# Patient Record
Sex: Male | Born: 1980
Health system: Southern US, Community
[De-identification: ages and names within clinical notes are randomized; demographics above are authoritative.]

## PROBLEM LIST (undated history)

## (undated) DIAGNOSIS — I10 Essential (primary) hypertension: Secondary | ICD-10-CM

## (undated) DIAGNOSIS — Z8709 Personal history of other diseases of the respiratory system: Secondary | ICD-10-CM

## (undated) DIAGNOSIS — R45851 Suicidal ideations: Secondary | ICD-10-CM

## (undated) DIAGNOSIS — Z8701 Personal history of pneumonia (recurrent): Secondary | ICD-10-CM

## (undated) DIAGNOSIS — E782 Mixed hyperlipidemia: Secondary | ICD-10-CM

## (undated) DIAGNOSIS — F331 Major depressive disorder, recurrent, moderate: Secondary | ICD-10-CM

## (undated) DIAGNOSIS — E119 Type 2 diabetes mellitus without complications: Secondary | ICD-10-CM

## (undated) DIAGNOSIS — E669 Obesity, unspecified: Secondary | ICD-10-CM

## (undated) DIAGNOSIS — K760 Fatty (change of) liver, not elsewhere classified: Secondary | ICD-10-CM

## (undated) HISTORY — DX: Fatty (change of) liver, not elsewhere classified: K76.0

## (undated) HISTORY — DX: Suicidal ideations: R45.851

## (undated) HISTORY — DX: Mixed hyperlipidemia: E78.2

## (undated) HISTORY — DX: Personal history of pneumonia (recurrent): Z87.01

## (undated) HISTORY — DX: Essential (primary) hypertension: I10

## (undated) HISTORY — DX: Major depressive disorder, recurrent, moderate: F33.1

## (undated) HISTORY — DX: Personal history of other diseases of the respiratory system: Z87.09

## (undated) HISTORY — DX: Type 2 diabetes mellitus without complications: E11.9

## (undated) HISTORY — DX: Obesity, unspecified: E66.9

---

## 2003-12-18 ENCOUNTER — Encounter: Admission: RE | Admit: 2003-12-18 | Discharge: 2004-03-17 | Payer: Self-pay | Admitting: Pulmonary Disease

## 2006-05-03 ENCOUNTER — Ambulatory Visit: Payer: Self-pay | Admitting: Pulmonary Disease

## 2007-04-05 ENCOUNTER — Ambulatory Visit: Payer: Self-pay | Admitting: Pulmonary Disease

## 2007-04-05 LAB — CONVERTED CEMR LAB
ALT: 41 units/L (ref 0–53)
AST: 25 units/L (ref 0–37)
Alkaline Phosphatase: 60 units/L (ref 39–117)
BUN: 10 mg/dL (ref 6–23)
Basophils Relative: 0.5 % (ref 0.0–1.0)
Calcium: 9.4 mg/dL (ref 8.4–10.5)
Cholesterol: 203 mg/dL (ref 0–200)
Creatinine, Ser: 1.1 mg/dL (ref 0.4–1.5)
Direct LDL: 154.1 mg/dL
Eosinophils Relative: 3.2 % (ref 0.0–5.0)
Glucose, Bld: 116 mg/dL — ABNORMAL HIGH (ref 70–99)
HCT: 45.1 % (ref 39.0–52.0)
Hemoglobin: 15.7 g/dL (ref 13.0–17.0)
Lymphocytes Relative: 22.9 % (ref 12.0–46.0)
MCHC: 34.9 g/dL (ref 30.0–36.0)
Neutrophils Relative %: 63.7 % (ref 43.0–77.0)
Platelets: 214 10*3/uL (ref 150–400)
Potassium: 4.1 meq/L (ref 3.5–5.1)
RDW: 12.7 % (ref 11.5–14.6)
Total Bilirubin: 1.1 mg/dL (ref 0.3–1.2)
Total CHOL/HDL Ratio: 7.3
VLDL: 28 mg/dL (ref 0–40)
WBC: 5.2 10*3/uL (ref 4.5–10.5)

## 2008-05-02 ENCOUNTER — Ambulatory Visit: Payer: Self-pay | Admitting: Pulmonary Disease

## 2008-05-03 ENCOUNTER — Encounter: Payer: Self-pay | Admitting: Pulmonary Disease

## 2009-05-20 ENCOUNTER — Ambulatory Visit: Payer: Self-pay | Admitting: Pulmonary Disease

## 2009-05-24 LAB — CONVERTED CEMR LAB
ALT: 38 units/L (ref 0–53)
Alkaline Phosphatase: 60 units/L (ref 39–117)
BUN: 10 mg/dL (ref 6–23)
Basophils Absolute: 0 10*3/uL (ref 0.0–0.1)
Basophils Relative: 0.4 % (ref 0.0–3.0)
CO2: 26 meq/L (ref 19–32)
Direct LDL: 153.4 mg/dL
Eosinophils Relative: 4 % (ref 0.0–5.0)
Hemoglobin: 15.2 g/dL (ref 13.0–17.0)
Ketones, ur: NEGATIVE mg/dL
Lymphocytes Relative: 31.9 % (ref 12.0–46.0)
Lymphs Abs: 1.6 10*3/uL (ref 0.7–4.0)
Monocytes Relative: 9.1 % (ref 3.0–12.0)
Potassium: 4.1 meq/L (ref 3.5–5.1)
RDW: 12.5 % (ref 11.5–14.6)
Sodium: 142 meq/L (ref 135–145)
TSH: 1.76 microintl units/mL (ref 0.35–5.50)
Total Protein, Urine: 30 mg/dL
Total Protein: 7.4 g/dL (ref 6.0–8.3)
Urine Glucose: NEGATIVE mg/dL
VLDL: 36.6 mg/dL (ref 0.0–40.0)

## 2009-08-17 ENCOUNTER — Telehealth: Payer: Self-pay | Admitting: Pulmonary Disease

## 2009-08-21 ENCOUNTER — Ambulatory Visit: Payer: Self-pay | Admitting: Adult Health

## 2009-08-21 ENCOUNTER — Telehealth: Payer: Self-pay | Admitting: Adult Health

## 2009-09-28 ENCOUNTER — Telehealth (INDEPENDENT_AMBULATORY_CARE_PROVIDER_SITE_OTHER): Payer: Self-pay | Admitting: *Deleted

## 2010-05-19 ENCOUNTER — Ambulatory Visit: Payer: Self-pay | Admitting: Pulmonary Disease

## 2010-05-24 LAB — CONVERTED CEMR LAB
ALT: 44 units/L (ref 0–53)
AST: 27 units/L (ref 0–37)
Albumin: 4.4 g/dL (ref 3.5–5.2)
BUN: 10 mg/dL (ref 6–23)
Bilirubin Urine: NEGATIVE
Bilirubin, Direct: 0.1 mg/dL (ref 0.0–0.3)
Chloride: 103 meq/L (ref 96–112)
Eosinophils Relative: 2.3 % (ref 0.0–5.0)
GFR calc non Af Amer: 90.3 mL/min (ref >60)
HCT: 45 % (ref 39.0–52.0)
Hemoglobin: 15.7 g/dL (ref 13.0–17.0)
Leukocytes, UA: NEGATIVE
Lymphocytes Relative: 29.6 % (ref 12.0–46.0)
Lymphs Abs: 2.2 10*3/uL (ref 0.7–4.0)
MCV: 84.7 fL (ref 78.0–100.0)
Monocytes Absolute: 0.6 10*3/uL (ref 0.1–1.0)
Monocytes Relative: 8.6 % (ref 3.0–12.0)
Neutro Abs: 4.3 10*3/uL (ref 1.4–7.7)
Platelets: 217 10*3/uL (ref 150.0–400.0)
Potassium: 5 meq/L (ref 3.5–5.1)
RBC: 5.32 M/uL (ref 4.22–5.81)
RDW: 13.4 % (ref 11.5–14.6)
Sodium: 139 meq/L (ref 135–145)
TSH: 1.43 microintl units/mL (ref 0.35–5.50)
Total Bilirubin: 0.6 mg/dL (ref 0.3–1.2)
Total CHOL/HDL Ratio: 6

## 2010-08-02 ENCOUNTER — Telehealth: Payer: Self-pay | Admitting: Pulmonary Disease

## 2010-10-17 LAB — CONVERTED CEMR LAB
AST: 32 units/L (ref 0–37)
Albumin: 4.2 g/dL (ref 3.5–5.2)
Alkaline Phosphatase: 52 units/L (ref 39–117)
BUN: 9 mg/dL (ref 6–23)
Basophils Relative: 0.2 % (ref 0.0–3.0)
Bilirubin, Direct: 0.3 mg/dL (ref 0.0–0.3)
CO2: 28 meq/L (ref 19–32)
Crystals: NEGATIVE
Direct LDL: 163.2 mg/dL
Eosinophils Absolute: 0.1 10*3/uL (ref 0.0–0.7)
GFR calc Af Amer: 115 mL/min
GFR calc non Af Amer: 95 mL/min
HCT: 43.9 % (ref 39.0–52.0)
Hemoglobin, Urine: NEGATIVE
Ketones, ur: NEGATIVE mg/dL
Neutro Abs: 3.5 10*3/uL (ref 1.4–7.7)
Nitrite: NEGATIVE
RDW: 12.6 % (ref 11.5–14.6)
Sodium: 138 meq/L (ref 135–145)
Specific Gravity, Urine: 1.02 (ref 1.000–1.03)
Total Bilirubin: 1.5 mg/dL — ABNORMAL HIGH (ref 0.3–1.2)
Urobilinogen, UA: 1 (ref 0.0–1.0)
WBC: 5.5 10*3/uL (ref 4.5–10.5)
pH: 7 (ref 5.0–8.0)

## 2010-10-19 NOTE — Progress Notes (Signed)
Summary: FEVER/ COUGH  Phone Note Call from Patient   Caller: Patient Call For: Cassidy Tabet Summary of Call: PT REQUESTING Z PAK FOR FEVER COUGH AND CONGESTION [facility] [address] [place] Initial call taken by: Rickard Patience,  August 02, 2010 10:27 AM  Follow-up for Phone Call        Pt c/o head congestion, chest congestion, dry cough x 1 day. Pt requesting an rx for a ZPAK. Please advise.Carron Curie CMA  August 02, 2010 11:24 AM   Additional Follow-up for Phone Call Additional follow up Details #1::        per SN---ok for pt to have zpak #1  take as directed with no refills. this med has been sent to pharmacy and pt is aware Randell Loop CMA  August 02, 2010 2:13 PM     New/Updated Medications: ZITHROMAX Z-PAK 250 MG TABS (AZITHROMYCIN) take as directed Prescriptions: ZITHROMAX Z-PAK 250 MG TABS (AZITHROMYCIN) take as directed  #1 pak x 0   Entered by:   Randell Loop CMA   Authorized by:   Michele Mcalpine MD   Signed by:   Randell Loop CMA on 08/02/2010   Method used:   Electronically to        [facility] (retail)       [address]       [place], [place]  [address]       Ph: [phone] or [phone]       Fax: [phone]   RxID:   [id number]

## 2010-10-19 NOTE — Assessment & Plan Note (Signed)
Summary: rov 1 yr ///kp   CC:  Yearly ROV & CPX....  History of Present Illness: 30 y/o WM here for a follow up eval and CPX... he has a problem w/ Obesity & elevated lipids and blood sugar...   ~  May 20, 2009:  he's had a good year- still working for [employer]... hasn't been able to lose weight & we discussed diet + exercise strastegies... he tried Prav40 last yr but didn't stick w/ it beyond a month or so- preferring diet Rx...    ~  May 19, 2010:  his weight is up 18# this past yr to 315# now> we discussed the critically important need to diet/ exercise/ get wt down... BP remains OK but needs better diet to be able to avoid meds in the future... Hyperlipidemia persists and needs max efort if he wants to avoid meds here too... we had a frank discussion about these issues> he is moving in w/ his girlfriend & hopefully she will be able to assist in his diet efforts...    Current Problem List:  PHYSICAL EXAMINATION (ICD-V70.0) - good general health... feels well, no new complaints or concerns...  HYPERTENSION, BORDERLINE (ICD-401.9) - BP's here 140's - 160's / 80's - 100... surely related to his weight... discussed diet etc in the past... BP today = 140/80 and it needs to be better... not yet ready to start meds...  HYPERCHOLESTEROLEMIA (ICD-272.0) - on diet alone...  ~  FLP 7/08 showed TChol 203, TG 141, HDL 28, LDL 154... rec diet + exercise...  ~  FLP 8/09 showed TChol 219, TG 127, HDL 26, LDL 163... rec> start PRAV40, but he prefers diet alone.  ~  FLP 9/10 showed TChol 205, TG 183, HDL 27, LDL 153  ~  FLP 8/11 showed TChol 190, TG 228, HDL 33, LDL 114  DIABETES MELLITUS, BORDERLINE (ICD-790.29) - on diet alone...  ~  labs 7/08 showed BS= 116... discussed weight reduction via low carb diet...  ~  labs 8/09 showed BS= 103  ~  labs 9/10 showed BS= 120  ~  labs 8/11 showed BS= 110, A1c= 5.8  OBESITY (ICD-278.00) - difficult for him to diet/ exercise due to his work  schedule...  ~  weight 8/09 = 295#, up 3# in the last year...  ~  weight 9/10 = 298#  ~  weight 8/11 - 314#   Preventive Screening-Counseling & Management  Alcohol-Tobacco     Smoking Status: never  Allergies (verified): No Known Drug Allergies  Comments:  Nurse/Medical Assistant: The patient's medications and allergies were reviewed with the patient and were updated in the Medication and Allergy Lists.  Past History:  Past Medical History: HYPERTENSION, BORDERLINE (ICD-401.9) HYPERCHOLESTEROLEMIA (ICD-272.0) DIABETES MELLITUS, BORDERLINE (ICD-790.29) OBESITY (ICD-278.00)  Family History: Reviewed history from 05/20/2009 and no changes required. Father, Dorinda Hill, alive age 17 w/ hx CAD, Obesity, Chol, DM. Mother, Eunice Blase, alive age 86 w/ hx HBP, Obesity, Chol. No siblings  Social History: Reviewed history from 05/20/2009 and no changes required. Single no children never smoked exercises sometimes drinks caffeine no etoh  Review of Systems  The patient denies fever, chills, sweats, anorexia, fatigue, weakness, malaise, weight loss, sleep disorder, blurring, diplopia, eye irritation, eye discharge, vision loss, eye pain, photophobia, earache, ear discharge, tinnitus, decreased hearing, nasal congestion, nosebleeds, sore throat, hoarseness, chest pain, palpitations, syncope, dyspnea on exertion, orthopnea, PND, peripheral edema, cough, dyspnea at rest, excessive sputum, hemoptysis, wheezing, pleurisy, nausea, vomiting, diarrhea, constipation, change in bowel  habits, abdominal pain, melena, hematochezia, jaundice, gas/bloating, indigestion/heartburn, dysphagia, odynophagia, dysuria, hematuria, urinary frequency, urinary hesitancy, nocturia, incontinence, back pain, joint pain, joint swelling, muscle cramps, muscle weakness, stiffness, arthritis, sciatica, restless legs, leg pain at night, leg pain with exertion, rash, itching, dryness, suspicious lesions, paralysis, paresthesias,  seizures, tremors, vertigo, transient blindness, frequent falls, frequent headaches, difficulty walking, depression, anxiety, memory loss, confusion, cold intolerance, heat intolerance, polydipsia, polyphagia, polyuria, unusual weight change, abnormal bruising, bleeding, enlarged lymph nodes, urticaria, allergic rash, hay fever, and recurrent infections.    Vital Signs:  Patient profile:   30 year old male Height:      76 inches Weight:      314.50 pounds BMI:     38.42 O2 Sat:      98 % on Room air Temp:     97.3 degrees F oral Pulse rate:   118 / minute BP sitting:   140 / 80  (left arm) Cuff size:   large  Vitals Entered By: Randell Loop CMA (May 19, 2010 10:19 AM)  O2 Sat at Rest %:  98 O2 Flow:  Room air CC: Yearly ROV & CPX... Is Patient Diabetic? No Pain Assessment Patient in pain? no      Comments no daily meds    Physical Exam  Additional Exam:  WD, Obese, 30 y/o WM in NAD... GENERAL:  Alert & oriented; pleasant & cooperative... HEENT:  [place]/AT, EOM-wnl, PERRLA, Fundi-benign, EACs-clear, TMs-wnl, NOSE-clear, THROAT-clear & wnl. NECK:  Supple w/ full ROM; no JVD; normal carotid impulses w/o bruits; no thyromegaly or nodules palpated; no lymphadenopathy. CHEST:  Clear to P & A; without wheezes/ rales/ or rhonchi. HEART:  Regular Rhythm; without murmurs/ rubs/ or gallops. ABDOMEN:  Soft & nontender; normal bowel sounds; no organomegaly or masses detected. EXT: without deformities or arthritic changes; no varicose veins/ venous insuffic/ or edema. NEURO:  CN's intact; motor testing normal; sensory testing normal; gait normal & balance OK. DERM:  No lesions noted; no rash etc...    CXR  Procedure date:  05/19/2010  Findings:      CHEST - 2 VIEW Comparison: Chest x-ray 04/05/2007   Findings: The cardiac silhouette, mediastinal and hilar contours are within normal limits and stable.  The lungs are clear.  The bony thorax is intact.   IMPRESSION: No acute  cardiopulmonary findings.  No change since prior study.   Read By:  Cyndie Chime,  M.D.   EKG  Procedure date:  05/19/2010  Findings:      Normal sinus rhythm with rate of:  100/ min... Tracing is WNL, NAD... SN   MISC. Report  Procedure date:  05/19/2010  Findings:      BMP (METABOL)   Sodium                    139 mEq/L                   135-145   Potassium                 5.0 mEq/L                   3.5-5.1   Chloride                  103 mEq/L                   96-112   Carbon Dioxide  29 mEq/L                    19-32   Glucose              [H]  110 mg/dL                   11-91   BUN                       10 mg/dL                    4-78   Creatinine                1.0 mg/dL                   2.9-5.6   Calcium                   9.5 mg/dL                   2.1-30.8   GFR                       90.30 mL/min                >60  Hepatic/Liver Function Panel (HEPATIC)   Total Bilirubin           0.6 mg/dL                   6.5-7.8   Direct Bilirubin          0.1 mg/dL                   4.6-9.6   Alkaline Phosphatase      62 U/L                      39-117   AST                       27 U/L                      0-37   ALT                       44 U/L                      0-53   Total Protein             7.4 g/dL                    2.9-5.2   Albumin                   4.4 g/dL                    8.4-1.3  CBC Platelet w/Diff (CBCD)   White Cell Count          7.3 K/uL                    4.5-10.5   Red Cell Count            5.32 Mil/uL                 4.22-5.81   Hemoglobin  15.7 g/dL                   78.2-95.6   Hematocrit                45.0 %                      39.0-52.0   MCV                       84.7 fl                     78.0-100.0   Platelet Count            217.0 K/uL                  150.0-400.0   Neutrophil %              59.2 %                      43.0-77.0   Lymphocyte %              29.6 %                      12.0-46.0    Monocyte %                8.6 %                       3.0-12.0   Eosinophils%              2.3 %                       0.0-5.0   Basophils %               0.3 %                       0.0-3.0  Comments:      Lipid Panel (LIPID)   Cholesterol               190 mg/dL                   2-130   Triglycerides        [H]  228.0 mg/dL                 8.6-578.4   HDL                  [L]  69.62 mg/dL                 >95.28 Cholesterol LDL - Direct                             114.3 mg/dL   TSH (TSH)   FastTSH                   1.43 uIU/mL                 0.35-5.50   Hemoglobin A1C (A1C)   Hemoglobin A1C            5.8 %                       4.6-6.5  UDip w/Micro (URINE)   Color                     YELLOW   Clarity                   CLEAR                       Clear   Specific Gravity          >=1.030                     1.000 - 1.030   Urine Ph                  6.0                         5.0-8.0   Protein                   NEGATIVE                    Negative   Urine Glucose             NEGATIVE                    Negative   Ketones                   NEGATIVE                    Negative   Urine Bilirubin           NEGATIVE                    Negative   Blood                     NEGATIVE                    Negative   Urobilinogen              0.2                         0.0 - 1.0   Leukocyte Esterace        NEGATIVE                    Negative   Nitrite                   NEGATIVE                    Negative   Urine WBC                 0-2/hpf                     0-2/hpf   Urine Mucus               Presence of                 None   Impression & Recommendations:  Problem # 1:  PHYSICAL EXAMINATION (ICD-V70.0)  Orders: EKG w/ Interpretation (93000) EKG w/ Interpretation (93000) T-2 View CXR (71020TC) TLB-BMP (Basic Metabolic Panel-BMET) (80048-METABOL) TLB-Hepatic/Liver Function Pnl (80076-HEPATIC) TLB-CBC Platelet - w/Differential (85025-CBCD) TLB-Lipid Panel  (80061-LIPID) TLB-TSH (Thyroid  Stimulating Hormone) (84443-TSH) TLB-A1C / Hgb A1C (Glycohemoglobin) (83036-A1C) TLB-Udip w/ Micro (81001-URINE)  Problem # 2:  HYPERTENSION, BORDERLINE (ICD-401.9) We discussed need for meds down the road if he doesn't get his weight down...  Problem # 3:  HYPERCHOLESTEROLEMIA (ICD-272.0) Labs not at goal & he will likely require med Rx if he is not successful losing weight...  Problem # 4:  OBESITY (ICD-278.00) Everything is contingent upon his weight reduction & her understands this...  Patient Instructions: 1)  Let's get on track w/ our diet & exercise program... the goal is to create a negative balance & shoot for 1-2 lbs per week... 2)  Today we did your follow up CXR, EKG, & FASTINg blood work... please call the "phone tree" in a few days for your lab results.Marland KitchenMarland Kitchen 3)  Call for any problems,... 4)  Please schedule a follow-up appointment in 6 months.   [facility] ECG  ID: [id number] Patient: James [name], James [name] DOB: Oct 14, 1980 Age: 30 Years Old Sex: Male Race: White Physician: Jeniel Slauson Technician: Kandice Hams CMA Height: 76 Weight: 314.50 Status: Unconfirmed Past Medical History:    HYPERTENSION, BORDERLINE (ICD-401.9) - borderline  HYPERCHOLESTEROLEMIA (ICD-272.0) - on diet alone...  ~  FLP 7/08 showed TChol 203, TG 141, HDL 28, LDL 154... rec diet + exercise...  ~  FLP 8/09 showed TChol 219, TG 127, HDL 26, LDL 163... rec> start PRAV40, but he prefers diet alone.  ~  FLP 9/10 showed TChol 205, TG 183, HDL 27, LDL 153  DIABETES MELLITUS, BORDERLINE (ICD-790.29) - on diet alone...  ~  labs 7/08 showed BS= 116... discussed weight reduction via low carb diet...  ~  labs 8/09 showed BS= 103  ~  labd 9/10 showed BS= 120  OBESITY (ICD-278.00) - difficult for him to diet/ exercise due to his work schedule...  ~  weight 8/09 = 295#, up 3# in the last year...  ~  weight 9/10 = 298#   Recorded: 05/19/2010 10:43 AM P/PR: 113 ms / 148 ms -  Heart rate (maximum exercise) QRS: 110 QT/QTc/QTd: 354 ms / 424 ms / 31 ms - Heart rate (maximum exercise)  P/QRS/T axis: 47 deg / -24 deg / 48 deg - Heart rate (maximum exercise)  Heartrate: 100 bpm  Interpretation:  Normal sinus rhythm with rate of:  100/ min... Tracing is WNL, NAD... SN     Immunization History:  Influenza Immunization History:    Influenza:  historical (08/05/2009)

## 2010-10-19 NOTE — Progress Notes (Signed)
Summary: rx request/ congestion  Phone Note Call from Patient   Caller: Patient Call For: nadel Summary of Call: pt c/o sinus congestion/ pressure in face/ ears. requests z pac. pt # [id number] Initial call taken by: Tivis Ringer,  September 28, 2009 1:25 PM  Follow-up for Phone Call        per sn ok for z-pak ov if no better after completing this Follow-up by: Philipp Deputy CMA,  September 28, 2009 1:27 PM    Prescriptions: ZITHROMAX Z-PAK 250 MG TABS (AZITHROMYCIN) take as directed  #1 x 0   Entered by:   Philipp Deputy CMA   Authorized by:   Michele Mcalpine MD   Signed by:   Philipp Deputy CMA on 09/28/2009   Method used:   [facility]        [facility] (retail)       [address]       [place], [place]  [address]       Ph: [phone] or [phone]       Fax: [phone]   RxID:   [id number]

## 2010-11-19 ENCOUNTER — Encounter: Payer: Self-pay | Admitting: Pulmonary Disease

## 2010-11-19 ENCOUNTER — Other Ambulatory Visit: Payer: Self-pay | Admitting: Pulmonary Disease

## 2010-11-19 ENCOUNTER — Other Ambulatory Visit: Payer: 59

## 2010-11-19 ENCOUNTER — Ambulatory Visit (INDEPENDENT_AMBULATORY_CARE_PROVIDER_SITE_OTHER): Payer: Self-pay | Admitting: Pulmonary Disease

## 2010-11-19 DIAGNOSIS — R7309 Other abnormal glucose: Secondary | ICD-10-CM

## 2010-11-19 DIAGNOSIS — E669 Obesity, unspecified: Secondary | ICD-10-CM

## 2010-11-19 DIAGNOSIS — E7800 Pure hypercholesterolemia, unspecified: Secondary | ICD-10-CM

## 2010-11-19 DIAGNOSIS — E785 Hyperlipidemia, unspecified: Secondary | ICD-10-CM

## 2010-11-19 DIAGNOSIS — I10 Essential (primary) hypertension: Secondary | ICD-10-CM

## 2010-11-19 LAB — BASIC METABOLIC PANEL
BUN: 10 mg/dL (ref 6–23)
Calcium: 9.5 mg/dL (ref 8.4–10.5)
Creatinine, Ser: 1 mg/dL (ref 0.4–1.5)
Glucose, Bld: 120 mg/dL — ABNORMAL HIGH (ref 70–99)

## 2010-11-19 LAB — LDL CHOLESTEROL, DIRECT: Direct LDL: 149.7 mg/dL

## 2010-11-19 LAB — LIPID PANEL
Cholesterol: 213 mg/dL — ABNORMAL HIGH (ref 0–200)
Triglycerides: 192 mg/dL — ABNORMAL HIGH (ref 0.0–149.0)

## 2010-11-20 DIAGNOSIS — E782 Mixed hyperlipidemia: Secondary | ICD-10-CM | POA: Insufficient documentation

## 2010-11-20 DIAGNOSIS — E1169 Type 2 diabetes mellitus with other specified complication: Secondary | ICD-10-CM | POA: Insufficient documentation

## 2010-11-23 ENCOUNTER — Encounter: Payer: Self-pay | Admitting: Pulmonary Disease

## 2010-11-25 NOTE — Assessment & Plan Note (Signed)
Summary: 6 month follow up   CC:  6 month ROV & review of mult medical problems....  History of Present Illness: 30 y/o James [name] here for a follow up eval and CPX... he has a problem w/ Obesity & elevated lipids and blood sugar...   ~  May 20, 2009:  he's had a good year- still working for [employer]... hasn't been able to lose weight & we discussed diet + exercise strategies... he tried Prav40 last yr but didn't stick w/ it beyond a month or so- preferring diet Rx...    ~  May 19, 2010:  his weight is up James# this past yr to 315# now> we discussed the critically important need to diet/ exercise/ get wt down... BP remains OK but needs better diet to be able to avoid meds in the future... Hyperlipidemia persists and needs max efort if he wants to avoid meds here too... we had a frank discussion about these issues> he is moving in w/ his girlfriend & hopefully she will be able to assist in his diet efforts...   ~  November 19, 2010:  unfortunately weight up further to 324# now & FBS120, A1c6.2, & FLP worse as well (see below)... he will need to start Statin rx & get on diet/ exercise program to avoid DM meds down the road... in addition BP= 136/94 & we discussed step one antihypertensive rx needed at this point... he understands the reasoning behind the rx recommendations & the importance of weight reduction (he declines to consider bariatric options & promises to do it w/ diet + exercise...    Current Problem List:  PHYSICAL EXAMINATION (ICD-V70.0) - good general health... feels well, no new complaints or concerns...  HYPERTENSION, BORDERLINE (ICD-401.9) - BP's here 140's - 160's / 80's - 100... surely related to his weight... discussed diet etc in the past... BP today = 140/94 and we discussed the risk of waiting any longer before starting antihypertensive medications...  ~  3/12:  BP = 140/94 & pt rec to start METOPROLOL ER 50mg /d; monitor BP at home.  MIXED HYPERLIPIDEMIA (ICD-272.2)  ~  FLP 7/08 on diet showed TChol 203, TG 141, HDL 28, LDL 154... rec diet + exercise...  ~  FLP 8/09 on diet showed TChol 219, TG 127, HDL 26, LDL 163... rec> start PRAV40, but he prefers diet alone.  ~  FLP 9/10 on diet showed TChol 205, TG 183, HDL 27, LDL 153  ~  FLP 8/11 on diet showed TChol 190, TG 228, HDL 33, LDL 114  ~  FLP 3/12 on diet showed TChol 213, TG 192, HDL 34, LDL 150... rec to start PRAV40.  DIABETES MELLITUS, BORDERLINE (ICD-790.29) - on diet alone...  ~  labs 7/08 showed BS= 116... discussed weight reduction via low carb diet...  ~  labs 8/09 showed BS= 103  ~  labs 9/10 showed BS= 120... he needs to do better.  ~  labs 8/11 showed BS= 110, A1c= 5.8  ~  labs 3/12 showed BS= 120, A1c= 6.2.Marland Kitchen. he want to continue diet efforts.  OBESITY (ICD-278.00) - difficult for him to diet/ exercise due to his work schedule...  ~  weight 8/09 = 295#, up 3# in the last year...  ~  weight 9/10 = 298#  ~  weight 8/11 = 314#  ~  weight 3.12 = 324#   Allergies (verified): No Known Drug Allergies  Comments:  Nurse/Medical Assistant: The patient's medications and allergies were reviewed with  the patient and were updated in the Medication and Allergy Lists.  Past History:  Past Medical History: HYPERTENSION, BORDERLINE (ICD-401.9) MIXED HYPERLIPIDEMIA (ICD-272.2) DIABETES MELLITUS, BORDERLINE (ICD-790.29) OBESITY (ICD-278.00)  Family History: Reviewed history from 05/19/2010 and no changes required. Father, James James [name], alive age 72 w/ hx CAD, Obesity, Chol, DM. Mother, James James [name], alive age 70 w/ hx HBP, Obesity, Chol. No siblings  Social History: Reviewed history from 05/20/2009 and no changes required. Single no children never smoked exercises sometimes drinks caffeine no etoh  Review of Systems      See HPI  The patient denies anorexia, fever, weight loss, weight gain, vision loss, decreased hearing, hoarseness, chest pain, syncope, dyspnea on exertion, peripheral edema,  prolonged cough, headaches, hemoptysis, abdominal pain, melena, hematochezia, severe indigestion/heartburn, hematuria, incontinence, muscle weakness, suspicious skin lesions, transient blindness, difficulty walking, depression, unusual weight change, abnormal bleeding, enlarged lymph nodes, and angioedema.    Vital Signs:  Patient profile:   30 year old male Height:      76 inches Weight:      324.13 pounds BMI:     39.60 O2 Sat:      95 % on Room air Temp:     97.0 degrees F oral Pulse rate:   87 / minute BP sitting:   140 / 94  (left arm) Cuff size:   regular  Vitals Entered By: Randell Loop CMA (November 19, 2010 9:53 AM)  O2 Sat at Rest %:  95 O2 Flow:  Room air  Physical Exam  Additional Exam:  WD, Obese, 29 y/o James [name] in NAD... GENERAL:  Alert & oriented; pleasant & cooperative... HEENT:  [place]/AT, EOM-wnl, PERRLA, Fundi-benign, EACs-clear, TMs-wnl, NOSE-clear, THROAT-clear & wnl. NECK:  Supple w/ full ROM; no JVD; normal carotid impulses w/o bruits; no thyromegaly or nodules palpated; no lymphadenopathy. CHEST:  Clear to P & A; without wheezes/ rales/ or rhonchi. HEART:  Regular Rhythm; without murmurs/ rubs/ or gallops. ABDOMEN:  Soft & nontender; normal bowel sounds; no organomegaly or masses detected. EXT: without deformities or arthritic changes; no varicose veins/ venous insuffic/ or edema. NEURO:  CN's intact; motor testing normal; sensory testing normal; gait normal & balance OK. DERM:  No lesions noted; no rash etc...    Impression & Recommendations:  Problem # 1:  HYPERTENSION, BORDERLINE (ICD-401.9) BP at 140/94 coupled w/ his other risk factors Hyperlipid, DM, Obese> indicates need to be more aggressive w/ BP rx & he is rec to start METOPROLOL ER 50mg /d, & monitor BP at home... he understands & will comply w/ med Rx... His updated medication list for this problem includes:    Metoprolol Succinate 50 Mg Xr24h-tab (Metoprolol succinate) .Marland Kitchen... Take 1 tab by mouth once  daily...  Orders: TLB-BMP (Basic Metabolic Panel-BMET) (80048-METABOL) TLB-A1C / Hgb A1C (Glycohemoglobin) (83036-A1C) TLB-Lipid Panel (80061-LIPID)  Problem # 2:  MIXED HYPERLIPIDEMIA (ICD-272.2) With his wt gain the FLP is further deranged 7 he needs to restart med rx for this prob as well...  REC>  start PRAVASTATIN 40mg  (on the $4 list)...   Problem # 3:  DIABETES MELLITUS, BORDERLINE (ICD-790.29) We discussed how he will need to add DM meds into the mix very soon if he doesn't lose the weight... for now A1c is still OK at 6.2 therefore> concentrate on diet & wt reduction...  Problem # 4:  OBESITY (ICD-278.00) Everything depends upon wt reduction... discussed in detil w/ pt...  Complete Medication List: 1)  Metoprolol Succinate 50 Mg Xr24h-tab (Metoprolol succinate) .... Take 1  tab by mouth once daily...  Patient Instructions: 1)  Today we updated your med list- see below.... 2)  We decided to start METOPROLOL ER 50mg /d for your BP.Marland KitchenMarland Kitchen 3)  Today we did your follow up fasting labs... please call the "phone tree" in a few days for your lab results.Marland KitchenMarland Kitchen 4)  Let's get on track w/ our diet & exercise program!!! 5)  Call for any questions.Marland KitchenMarland Kitchen 6)  Please schedule a follow-up appointment in 6 months. Prescriptions: METOPROLOL SUCCINATE 50 MG XR24H-TAB (METOPROLOL SUCCINATE) take 1 tab by mouth once daily...  #90 x 4   Entered and Authorized by:   Michele Mcalpine MD   Signed by:   Michele Mcalpine MD on 11/19/2010   Method used:   Print then Give to Patient   RxID:   [id number]

## 2010-11-25 NOTE — Letter (Signed)
Summary: [employer]  [address]   [place], [place] [address]   Phone: [phone]  Fax: [phone]    Today's Date: November 19, 2010  Name of Patient: James [name]  The above named patient had a medical visit today at:  10:00 am.  Please take this into consideration when reviewing the time away from work.    Special Instructions:  [ x ] None  [  ] To be off the remainder of today, returning to the normal work / school schedule tomorrow.  [  ] To be off until the next scheduled appointment on ______________________.  [  ] Other ________________________________________________________________ ________________________________________________________________________   Sincerely yours,   Vernie Murders

## 2010-11-25 NOTE — Letter (Signed)
Summary: [employer]  [address]   [place], [place] [address]   Phone: [phone]  Fax: [phone]    Today's Date: November 19, 2010  Name of Patient: James [name]  The above named patient had a medical visit today at: 10 am .  Please take this into consideration when reviewing the time away from work.    Special Instructions:  [ x ] None  [  ] To be off the remainder of today, returning to the normal work / school schedule tomorrow.  [  ] To be off until the next scheduled appointment on ______________________.  [  ] Other ________________________________________________________________ ________________________________________________________________________   Sincerely yours,     Alroy Dust, MD

## 2010-11-30 NOTE — Miscellaneous (Signed)
Summary: lab results/start on pravastatin  Clinical Lists Changes  Medications: Added new medication of PRAVASTATIN SODIUM 40 MG TABS (PRAVASTATIN SODIUM) take one tablet by mouth at bedtime - Signed Rx of PRAVASTATIN SODIUM 40 MG TABS (PRAVASTATIN SODIUM) take one tablet by mouth at bedtime;  #90 x 3;  Signed;  Entered by: Randell Loop CMA;  Authorized by: Michele Mcalpine MD;  Method used: Electronically to [facility], [place], [place]  [address], Ph: [phone] or [phone], Fax: [phone]    Prescriptions: PRAVASTATIN SODIUM 40 MG TABS (PRAVASTATIN SODIUM) take one tablet by mouth at bedtime  #90 x 3   Entered by:   Randell Loop CMA   Authorized by:   Michele Mcalpine MD   Signed by:   Randell Loop CMA on 11/23/2010   Method used:   Electronically to        [facility] [id number] (retail)       [address]       [place], [place]  [address]       Ph: [phone] or [phone]       Fax: [phone]   RxID:   [id number]  called and spoke with pt about his lab results----per SN---fbs 120--a1c 6.2---does not need meds now but needs to get his weight down--lip-213  tg 192    hld 34 and ldl 150----recs are to start on pravastatin 40 at bedtime and this [facility] supply and pt is aware.   pt needs to get on diet and get his weight down and start low chol/low fat diet.  pt voiced his understanding of this and will call for any concerns Randell Loop CMA  November 23, 2010 2:19 PM

## 2011-05-27 ENCOUNTER — Ambulatory Visit: Payer: 59 | Admitting: Pulmonary Disease

## 2011-05-30 ENCOUNTER — Other Ambulatory Visit: Payer: Self-pay | Admitting: Pulmonary Disease

## 2011-05-30 ENCOUNTER — Encounter: Payer: Self-pay | Admitting: Pulmonary Disease

## 2011-05-30 ENCOUNTER — Other Ambulatory Visit (INDEPENDENT_AMBULATORY_CARE_PROVIDER_SITE_OTHER): Payer: 59

## 2011-05-30 ENCOUNTER — Ambulatory Visit (INDEPENDENT_AMBULATORY_CARE_PROVIDER_SITE_OTHER): Payer: 59 | Admitting: Pulmonary Disease

## 2011-05-30 DIAGNOSIS — I10 Essential (primary) hypertension: Secondary | ICD-10-CM

## 2011-05-30 DIAGNOSIS — R7309 Other abnormal glucose: Secondary | ICD-10-CM

## 2011-05-30 DIAGNOSIS — E669 Obesity, unspecified: Secondary | ICD-10-CM

## 2011-05-30 DIAGNOSIS — E782 Mixed hyperlipidemia: Secondary | ICD-10-CM

## 2011-05-30 LAB — CBC WITH DIFFERENTIAL/PLATELET
Basophils Relative: 0.5 % (ref 0.0–3.0)
Eosinophils Relative: 3.8 % (ref 0.0–5.0)
HCT: 46 % (ref 39.0–52.0)
Hemoglobin: 15.3 g/dL (ref 13.0–17.0)
Lymphs Abs: 2 10*3/uL (ref 0.7–4.0)
MCV: 85.6 fl (ref 78.0–100.0)
Monocytes Absolute: 0.6 10*3/uL (ref 0.1–1.0)
Monocytes Relative: 8.4 % (ref 3.0–12.0)
RBC: 5.38 Mil/uL (ref 4.22–5.81)
WBC: 6.6 10*3/uL (ref 4.5–10.5)

## 2011-05-30 LAB — LIPID PANEL
Triglycerides: 180 mg/dL — ABNORMAL HIGH (ref 0.0–149.0)
VLDL: 36 mg/dL (ref 0.0–40.0)

## 2011-05-30 LAB — TSH: TSH: 1.35 u[IU]/mL (ref 0.35–5.50)

## 2011-05-30 LAB — BASIC METABOLIC PANEL
BUN: 9 mg/dL (ref 6–23)
Chloride: 106 mEq/L (ref 96–112)
Glucose, Bld: 119 mg/dL — ABNORMAL HIGH (ref 70–99)
Potassium: 4.1 mEq/L (ref 3.5–5.1)

## 2011-05-30 LAB — HEPATIC FUNCTION PANEL
ALT: 82 U/L — ABNORMAL HIGH (ref 0–53)
AST: 56 U/L — ABNORMAL HIGH (ref 0–37)
Albumin: 4.3 g/dL (ref 3.5–5.2)

## 2011-05-30 NOTE — Patient Instructions (Signed)
Today we updated your meds in EPIC...    Continue your current meds the same...  Today we did your follow up fasting blood work...    Please call the PHONE TREE in a few days for your results...    Dial [id number] & when prompted enter your patient number followed by the # symbol...    Your patient number is:  [id number]#  Let's get on track w/ our diet & exercise program...    The goal is to lose 15-20 lbs...  Call for any questions...  Let's plan a follow up visit in 6 months to monitor your progress.Marland KitchenMarland Kitchen

## 2011-05-30 NOTE — Progress Notes (Signed)
Subjective:    Patient ID: James [name], male    DOB: April 06, 1981, 30 y.o.   MRN: [id number]  HPI 30 y/o WM here for a follow up eval and CPX... he has a problem w/ Obesity & elevated lipids and blood sugar...  ~  May 20, 2009:  he's had a good year- still working for [employer]... hasn't been able to lose weight & we discussed diet + exercise strategies... he tried Prav40 last yr but didn't stick w/ it beyond a month or so- preferring diet Rx...   ~  May 19, 2010:  his weight is up 18# this past yr to 315# now> we discussed the critically important need to diet/ exercise/ get wt down... BP remains OK but needs better diet to be able to avoid meds in the future... Hyperlipidemia persists and needs max efort if he wants to avoid meds here too... we had a frank discussion about these issues> he is moving in w/ his girlfriend & hopefully she will be able to assist in his diet efforts...  ~  November 19, 2010:  unfortunately weight up further to 324# now & FBS120, A1c6.2, & FLP worse as well (see below)... he will need to start Statin rx & get on diet/ exercise program to avoid DM meds down the road... in addition BP= 136/94 & we discussed step one antihypertensive rx needed at this point... he understands the reasoning behind the rx recommendations & the importance of weight reduction (he declines to consider bariatric options & promises to do it w/ diet + exercise...  ~  May 30, 2011:  38mo ROV & essentially no change> weight still 324# and FLP no better on PRAV40, plus his LFTs are sl elev now, therefore advised to STOP the Pravastatin & get on diet, exercise, wt reduction program...    HBP> on ToprolXL50 & BP= 126/84; he is asymptomatic- w/o HA, CP, palpit, SOB, swelling, etc...    Hyperlipid> on Prav40 for the last 38mo; FLP today is NO BETTER & LFTs sl elev, therefore stop med for washout & needs better diet, exerc, lose wt!!!    Borderline DM> on diet alone; prev A1c=6.2;  labs today w/ BS119, A1c6.1; continue diet efforts...    Obesity> weight is 324# no change from 38mo ago & this is his max weight; we reviewed diet, exercise, etc...          Problem List:  PHYSICAL EXAMINATION (ICD-V70.0) - good general health... feels well, no new complaints or concerns...  HYPERTENSION, BORDERLINE (ICD-401.9) - BP's here 140's-160's / 80's-100... surely related to his weight... discussed diet etc in the past... BP today = 126/84 on his METOPROLOL ER 50mg /d... ~  3/12:  BP= 140/94 & pt rec to start METOPROLOL ER 50mg /d; monitor BP at home. ~  9/12:  BP= 126/84, continue same med, better diet, get wt down...  MIXED HYPERLIPIDEMIA (ICD-272.2) ~  FLP 7/08 on diet showed TChol 203, TG 141, HDL 28, LDL 154... rec diet + exercise... ~  FLP 8/09 on diet showed TChol 219, TG 127, HDL 26, LDL 163... rec> start PRAV40, but he prefers diet alone. ~  FLP 9/10 on diet showed TChol 205, TG 183, HDL 27, LDL 153 ~  FLP 8/11 on diet showed TChol 190, TG 228, HDL 33, LDL 114 ~  FLP 3/12 on diet showed TChol 213, TG 192, HDL 34, LDL 150... rec to start PRAV40. ~  FLP 9/12 on Prav40 showed TChol 216, TG  180, HDL 34, LDL 157... No response & LFTs sl elev, therefore STOP Prav Rx  DIABETES MELLITUS, BORDERLINE (ICD-790.29) - on diet alone... ~  labs 7/08 showed BS= 116... discussed weight reduction via low carb diet... ~  labs 8/09 showed BS= 103 ~  labs 9/10 showed BS= 120... he needs to do better. ~  labs 8/11 showed BS= 110, A1c= 5.8 ~  labs 3/12 showed BS= 120, A1c= 6.2.Marland Kitchen. he want to continue diet efforts. ~  Labs 9/12 showed BS= 119, A1c= 6.1  OBESITY (ICD-278.00) - difficult for him to diet/ exercise due to his work schedule... ~  weight 8/09 = 295#, up 3# in the last year... ~  weight 9/10 = 298# ~  weight 8/11 = 314# ~  weight 3.12 = 324# ~  Weight 9/12 = 324#   No past surgical history on file.   Outpatient Encounter Prescriptions as of 05/30/2011  Medication Sig Dispense  Refill  . metoprolol (TOPROL-XL) 50 MG 24 hr tablet Take 50 mg by mouth daily.        . pravastatin (PRAVACHOL) 40 MG tablet Take 40 mg by mouth daily.          No Known Allergies   Current Medications, Allergies, Past Medical History, Past Surgical History, Family History, and Social History were reviewed in [employer] [facility] record.    Review of Systems        See HPI - all other systems neg except as noted...  The patient denies anorexia, fever, weight loss, weight gain, vision loss, decreased hearing, hoarseness, chest pain, syncope, dyspnea on exertion, peripheral edema, prolonged cough, headaches, hemoptysis, abdominal pain, melena, hematochezia, severe indigestion/heartburn, hematuria, incontinence, muscle weakness, suspicious skin lesions, transient blindness, difficulty walking, depression, unusual weight change, abnormal bleeding, enlarged lymph nodes, and angioedema.     Objective:   Physical Exam    WD, Obese, 30 y/o WM in NAD... GENERAL:  Alert & oriented; pleasant & cooperative... HEENT:  [place]/AT, EOM-wnl, PERRLA, Fundi-benign, EACs-clear, TMs-wnl, NOSE-clear, THROAT-clear & wnl. NECK:  Supple w/ full ROM; no JVD; normal carotid impulses w/o bruits; no thyromegaly or nodules palpated; no lymphadenopathy. CHEST:  Clear to P & A; without wheezes/ rales/ or rhonchi. HEART:  Regular Rhythm; without murmurs/ rubs/ or gallops. ABDOMEN:  Soft & nontender; normal bowel sounds; no organomegaly or masses detected. EXT: without deformities or arthritic changes; no varicose veins/ venous insuffic/ or edema. NEURO:  CN's intact; motor testing normal; sensory testing normal; gait normal & balance OK. DERM:  No lesions noted; no rash etc...   Assessment & Plan:   HBP>  Controlled on the ToprolXL but needs better diet 7 wt reduction...  HYPERLIPID>  FLP no better on the Prav40 (he says he was taking it regularly- we did not check w/ pharm); LFT sl elev as well;  therefore decided to STOP the med, concentrate on diet & weight reduction; if not successful over the next 31mo, then we will consider Cres10...  DM>  On diet alone & still adeq managed but needs wt reduction as noted...  OBESITY>  Weight is unchanged at 324#; we reviewed wt reduction strategies.Marland KitchenMarland Kitchen

## 2011-06-02 ENCOUNTER — Telehealth: Payer: Self-pay | Admitting: *Deleted

## 2011-06-03 NOTE — Telephone Encounter (Signed)
lmom to make pt aware of phone note and of lab results.   Please see result note.

## 2011-06-11 ENCOUNTER — Encounter: Payer: Self-pay | Admitting: Pulmonary Disease

## 2011-11-28 ENCOUNTER — Encounter: Payer: Self-pay | Admitting: Pulmonary Disease

## 2011-11-28 ENCOUNTER — Other Ambulatory Visit (INDEPENDENT_AMBULATORY_CARE_PROVIDER_SITE_OTHER): Payer: 59

## 2011-11-28 ENCOUNTER — Ambulatory Visit (INDEPENDENT_AMBULATORY_CARE_PROVIDER_SITE_OTHER): Payer: 59 | Admitting: Pulmonary Disease

## 2011-11-28 VITALS — BP 122/80 | HR 103 | Temp 97.5°F | Ht 76.0 in | Wt 324.8 lb

## 2011-11-28 DIAGNOSIS — I10 Essential (primary) hypertension: Secondary | ICD-10-CM

## 2011-11-28 DIAGNOSIS — E782 Mixed hyperlipidemia: Secondary | ICD-10-CM

## 2011-11-28 DIAGNOSIS — E669 Obesity, unspecified: Secondary | ICD-10-CM

## 2011-11-28 DIAGNOSIS — R7309 Other abnormal glucose: Secondary | ICD-10-CM

## 2011-11-28 LAB — BASIC METABOLIC PANEL
CO2: 27 mEq/L (ref 19–32)
Calcium: 9.1 mg/dL (ref 8.4–10.5)
Creatinine, Ser: 0.9 mg/dL (ref 0.4–1.5)
GFR: 101.83 mL/min (ref >60.00)
Glucose, Bld: 165 mg/dL — ABNORMAL HIGH (ref 70–99)
Sodium: 138 mEq/L (ref 135–145)

## 2011-11-28 LAB — LIPID PANEL
Cholesterol: 211 mg/dL — ABNORMAL HIGH (ref 0–200)
HDL: 36.1 mg/dL — ABNORMAL LOW (ref >39.00)
Total CHOL/HDL Ratio: 6
Triglycerides: 228 mg/dL — ABNORMAL HIGH (ref 0.0–149.0)

## 2011-11-28 LAB — LDL CHOLESTEROL, DIRECT: Direct LDL: 123.4 mg/dL

## 2011-11-28 MED ORDER — ATENOLOL 100 MG PO TABS: 100.0000 mg | ORAL_TABLET | Freq: Every day | ORAL | Status: DC

## 2011-11-28 NOTE — Progress Notes (Signed)
Subjective:    Patient ID: James [name], male    DOB: [birth year], 31 y.o.   MRN: [id number]  HPI 31 y/o WM here for a follow up eval and CPX... he has a problem w/ Obesity & elevated lipids and blood sugar...  ~  May 19, 2010:  his weight is up 18# this past yr to 315# now> we discussed the critically important need to diet/ exercise/ get wt down... BP remains OK but needs better diet to be able to avoid meds in the future... Hyperlipidemia persists and needs max effort if he wants to avoid meds here too... we had a frank discussion about these issues> he is moving in w/ his girlfriend & hopefully she will be able to assist in his diet efforts...  ~  November 19, 2010:  unfortunately weight up further to 324# now & FBS120, A1c6.2, & FLP worse as well (see below)... he will need to start Statin rx & get on diet/ exercise program to avoid DM meds down the road... in addition BP= 136/94 & we discussed step one antihypertensive rx needed at this point... he understands the reasoning behind the rx recommendations & the importance of weight reduction (he declines to consider bariatric options & promises to do it w/ diet + exercise...  ~  May 30, 2011:  85mo ROV & essentially no change> weight still 324# and FLP no better on PRAV40, plus his LFTs are sl elev now, therefore advised to STOP the Pravastatin & get on diet, exercise, wt reduction program...    HBP> on ToprolXL50 & BP= 126/84; he is asymptomatic- w/o HA, CP, palpit, SOB, swelling, etc...    Hyperlipid> on Prav40 for the last 85mo; FLP today is NO BETTER & LFTs sl elev, therefore stop med for washout & needs better diet, exerc, lose wt!!!    Borderline DM> on diet alone; prev A1c=6.2; labs today w/ BS119, A1c6.1; continue diet efforts...    Obesity> weight is 324# no change from 85mo ago & this is his max weight; we reviewed diet, exercise, etc...  ~  November 28, 2011:  85mo ROV & James [name] feels well but wt is essent the same & while labs  today showed some improvement in FLP on diet alone, his BS/ A1c are worse & he will need to start Metformin;  He also c/o some palpit on the Metoprolol he says describing a rapid regular beating that tapers off slowly to normal; we decided to change his BBlocker to ATENOLOL 100mg /d & follow his symptoms...    HBP, palpit> on ToprolXL50 & BP= 122/80 but he's noted some palpit- rapid regular & tapers off slowly to normal; we decided to change BBlocker to ATENOLOL 100mg /d...    Hyperlipid> off Prav40 for the last 85mo due to elev LFTs; he is on diet alone but hasn't lost any wt; FLP is actually better now w/ [id number] & [id number]; rec to continue diet & exercise!    Borderline DM> on diet alone; wt up 1# to 254#; labs are worse w/ FBS=165, A1c=7.0; we decided to START METFORMIN 500mg  Qam, plus much better diet & exercise!!!    Obesity> weight is 325# up 1# from 85mo ago & this is his max weight; we reviewed diet, exercise, etc; he understands that wt reduction is the key to getting off meds...          Problem List:  PHYSICAL EXAMINATION (ICD-V70.0) - good general health... feels well, no new complaints or concerns...  HYPERTENSION, BORDERLINE (  ICD-401.9) - BP's here 140's-160's / 80's-100> surely related to his weight> discussed diet & exercise needed. ~  3/12:  BP= 140/94 & pt rec to start METOPROLOL ER 50mg /d; monitor BP at home. ~  9/12:  BP= 126/84, continue same med, better diet, get wt down... ~  3/13:  BP= 122/80 but he is c/o palpit- rapid, regular, & tapers off to normal; he thinks related to Metoprolol Rx therefore switched to ATENOLOL 100mg /d, monitor BP & palpit.  MIXED HYPERLIPIDEMIA (ICD-272.2) >> ~  FLP 7/08 on diet showed TChol 203, TG 141, HDL 28, LDL 154... rec diet + exercise... ~  FLP 8/09 on diet showed TChol 219, TG 127, HDL 26, LDL 163... rec> start PRAV40, but he prefers diet alone. ~  FLP 9/10 on diet showed TChol 205, TG 183, HDL 27, LDL 153 ~  FLP 8/11 on diet showed TChol 190,  TG 228, HDL 33, LDL 114 ~  FLP 3/12 on diet showed TChol 213, TG 192, HDL 34, LDL 150... rec to start PRAV40. ~  FLP 9/12 on Prav40 showed TChol 216, TG 180, HDL 34, LDL 157... No response & LFTs sl elev, therefore STOP Prav Rx. ~  FLP 3/13 on diet showed TChol 211, TG 228, HDL 36, LDL 123... rec better low chol/ low fat diet & get wt down!  DIABETES MELLITUS, BORDERLINE (ICD-790.29) >> ~  labs 7/08 showed BS= 116... discussed weight reduction via low carb diet... ~  labs 8/09 showed BS= 103 ~  labs 9/10 showed BS= 120... he needs to do better. ~  labs 8/11 showed BS= 110, A1c= 5.8 ~  labs 3/12 showed BS= 120, A1c= 6.2.Marland Kitchen. he want to continue diet efforts. ~  Labs 9/12 showed BS= 119, A1c= 6.1.Marland KitchenMarland Kitchen He remains on diet alone. ~  Labs 3/13 showed BS= 165, A1c= 7.0.Marland KitchenMarland Kitchen rec to start METFORMIN 500mg Qam & better diet!!!  OBESITY (ICD-278.00) - difficult for him to diet/ exercise due to his work schedule... ~  weight 8/09 = 295#, up 3# in the last year... ~  weight 9/10 = 298# ~  weight 8/11 = 314# ~  weight 3.12 = 324# ~  Weight 9/12 = 324# ~  Weight 3/13 = 325#   No past surgical history on file.   Outpatient Encounter Prescriptions as of 11/28/2011  Medication Sig Dispense Refill  . metoprolol (TOPROL-XL) 50 MG 24 hr tablet Take 50 mg by mouth daily.        Marland Kitchen DISCONTD: pravastatin (PRAVACHOL) 40 MG tablet Take 40 mg by mouth daily.          No Known Allergies   Current Medications, Allergies, Past Medical History, Past Surgical History, Family History, and Social History were reviewed in [employer] record.    Review of Systems        See HPI - all other systems neg except as noted...  The patient denies anorexia, fever, weight loss, weight gain, vision loss, decreased hearing, hoarseness, chest pain, syncope, dyspnea on exertion, peripheral edema, prolonged cough, headaches, hemoptysis, abdominal pain, melena, hematochezia, severe indigestion/heartburn,  hematuria, incontinence, muscle weakness, suspicious skin lesions, transient blindness, difficulty walking, depression, unusual weight change, abnormal bleeding, enlarged lymph nodes, and angioedema.     Objective:   Physical Exam    WD, Obese, 31 y/o WM in NAD... GENERAL:  Alert & oriented; pleasant & cooperative... HEENT:  [place]/AT, EOM-wnl, PERRLA, Fundi-benign, EACs-clear, TMs-wnl, NOSE-clear, THROAT-clear & wnl. NECK:  Supple w/ full ROM; no JVD; normal  carotid impulses w/o bruits; no thyromegaly or nodules palpated; no lymphadenopathy. CHEST:  Clear to P & A; without wheezes/ rales/ or rhonchi. HEART:  Regular Rhythm; without murmurs/ rubs/ or gallops. ABDOMEN:  Soft & nontender; normal bowel sounds; no organomegaly or masses detected. EXT: without deformities or arthritic changes; no varicose veins/ venous insuffic/ or edema. NEURO:  CN's intact; motor testing normal; sensory testing normal; gait normal & balance OK. DERM:  No lesions noted; no rash etc...  RADIOLOGY DATA:  Reviewed in the EPIC EMR & discussed w/ the patient...  LABORATORY DATA:  Reviewed in the EPIC EMR & discussed w/ the patient...   Assessment & Plan:   HBP & Palpitations>  Controlled on the ToprolXL but he wants to blame this for palpit; discussed w/ pt & decided to ch to ATENOLOL 100mg /d...  HYPERLIPID>  Off Prav40 for 8mo & LFTs now back to normal & surprisingly his FLP is somewhat improved as well; ok to continue low chol, low fat diet efforts...  DM>  On diet alone & BS/ A1c are both worse & he should start METFORMIN 500mg /d; I told him he might get to come off med in the future if he lost the weight...  OBESITY>  Weight is unchanged ~ 325#; we reviewed wt reduction strategies...   Patient's Medications  New Prescriptions   METFORMIN (GLUCOPHAGE) 500 MG TABLET    Take 1 tablet (500 mg total) by mouth daily with breakfast.  Previous Medications   No medications on file  Modified Medications    Modified Medication Previous Medication   ATENOLOL (TENORMIN) 100 MG TABLET atenolol (TENORMIN) 100 MG tablet      Take 1 tablet (100 mg total) by mouth daily.    Take 100 mg by mouth daily.  Discontinued Medications   METOPROLOL (TOPROL-XL) 50 MG 24 HR TABLET    Take 50 mg by mouth daily.     PRAVASTATIN (PRAVACHOL) 40 MG TABLET    Take 40 mg by mouth daily.

## 2011-11-28 NOTE — Patient Instructions (Signed)
Today we updated your med list in our EPIC system...    We decided to change your Metoprolol to ATENOLOL 100mg - take one tab daily...    This should help your BP & prevent the palpitations...  Today we did your follow up fasting blood work...    Please call the PHONE TREE in a few days for your results...    Dial [id number] & when prompted enter your patient number followed by the # symbol...    Your patient number is:  [id number]#  Let's get on track w/ our diet & exercise program, try wt watchers, etc...  Call for any questions...  Let's plan another follow up visit in 6 months.Marland KitchenMarland Kitchen

## 2011-11-29 ENCOUNTER — Other Ambulatory Visit: Payer: Self-pay | Admitting: Pulmonary Disease

## 2011-11-29 MED ORDER — METFORMIN HCL 500 MG PO TABS: 500.0000 mg | ORAL_TABLET | Freq: Every day | ORAL | Status: DC

## 2012-05-30 ENCOUNTER — Ambulatory Visit: Payer: 59 | Admitting: Pulmonary Disease

## 2012-08-30 ENCOUNTER — Telehealth: Payer: Self-pay | Admitting: Pulmonary Disease

## 2012-08-30 ENCOUNTER — Telehealth: Payer: Self-pay | Admitting: Endocrinology

## 2012-08-30 MED ORDER — ATENOLOL 100 MG PO TABS: 100.0000 mg | ORAL_TABLET | Freq: Every day | ORAL | Status: DC

## 2012-08-30 NOTE — Telephone Encounter (Signed)
Patient Information:  Caller Name: Santhosh  Phone: [phone]  Patient: James, [name]  Gender: Male  DOB: 06/13/1981  Age: 31 Years  PCP: Romero Belling (Adults only)  Office Follow Up:  Does the office need to follow up with this patient?: NO RN Note:  emergent call, phone connection problems, unable to complete triage regarding prescription needs.   uideline(s) Used:  No Protocol Available - Information Only  Disposition Per Guideline:  Home Care  Reason For Disposition Reached:  Information only question

## 2012-08-30 NOTE — Telephone Encounter (Signed)
lmomtcb x1 

## 2012-08-30 NOTE — Telephone Encounter (Signed)
I spoke with pt and is aware of price at [facility]. i have sent rx there and nothing further was needed. Pt will call back to make appt. Nothing further was needed

## 2012-08-30 NOTE — Telephone Encounter (Signed)
Returning call can be reached at [phone].James [name]

## 2012-08-30 NOTE — Telephone Encounter (Signed)
Pt returned call. James [name]  

## 2012-08-30 NOTE — Telephone Encounter (Signed)
LMOMTCB  Atenolol is on the $4/$10 list at [facility]. He needs an appointment. He was last seen in 11/2011 was to have a 6 month F/U and doesn't have any pending appointments.

## 2012-11-15 ENCOUNTER — Telehealth: Payer: Self-pay | Admitting: Pulmonary Disease

## 2012-11-15 MED ORDER — ATENOLOL 100 MG PO TABS: 100.0000 mg | ORAL_TABLET | Freq: Every day | ORAL | Status: DC

## 2012-11-15 NOTE — Telephone Encounter (Signed)
Rx has been sent in, pt aware. 

## 2012-11-23 ENCOUNTER — Ambulatory Visit: Payer: 59 | Admitting: Pulmonary Disease

## 2013-01-11 ENCOUNTER — Other Ambulatory Visit (INDEPENDENT_AMBULATORY_CARE_PROVIDER_SITE_OTHER): Payer: 59

## 2013-01-11 ENCOUNTER — Ambulatory Visit (INDEPENDENT_AMBULATORY_CARE_PROVIDER_SITE_OTHER): Payer: 59 | Admitting: Pulmonary Disease

## 2013-01-11 ENCOUNTER — Ambulatory Visit (INDEPENDENT_AMBULATORY_CARE_PROVIDER_SITE_OTHER)
Admission: RE | Admit: 2013-01-11 | Discharge: 2013-01-11 | Disposition: A | Discharge status: Home / Self Care | Payer: 59 | Source: Ambulatory Visit | Attending: Pulmonary Disease | Admitting: Pulmonary Disease

## 2013-01-11 ENCOUNTER — Encounter: Payer: Self-pay | Admitting: Pulmonary Disease

## 2013-01-11 VITALS — BP 140/90 | HR 78 | Temp 98.2°F | Ht 76.0 in | Wt 325.8 lb

## 2013-01-11 DIAGNOSIS — Z0000 Encounter for general adult medical examination without abnormal findings: Secondary | ICD-10-CM

## 2013-01-11 DIAGNOSIS — E669 Obesity, unspecified: Secondary | ICD-10-CM

## 2013-01-11 DIAGNOSIS — R7309 Other abnormal glucose: Secondary | ICD-10-CM

## 2013-01-11 DIAGNOSIS — I10 Essential (primary) hypertension: Secondary | ICD-10-CM

## 2013-01-11 DIAGNOSIS — E782 Mixed hyperlipidemia: Secondary | ICD-10-CM

## 2013-01-11 LAB — URINALYSIS, ROUTINE W REFLEX MICROSCOPIC
Bilirubin Urine: NEGATIVE
Leukocytes, UA: NEGATIVE
Nitrite: NEGATIVE
Specific Gravity, Urine: >=1.03 (ref 1.000–1.030)
Total Protein, Urine: 30
pH: 6 (ref 5.0–8.0)

## 2013-01-11 LAB — CBC WITH DIFFERENTIAL/PLATELET
Basophils Relative: 0.3 % (ref 0.0–3.0)
Eosinophils Absolute: 0.2 10*3/uL (ref 0.0–0.7)
Eosinophils Relative: 2.5 % (ref 0.0–5.0)
HCT: 45 % (ref 39.0–52.0)
Lymphocytes Relative: 30.2 % (ref 12.0–46.0)
MCHC: 34.3 g/dL (ref 30.0–36.0)
Neutro Abs: 4.8 10*3/uL (ref 1.4–7.7)
WBC: 8.3 10*3/uL (ref 4.5–10.5)

## 2013-01-11 LAB — BASIC METABOLIC PANEL
BUN: 11 mg/dL (ref 6–23)
CO2: 23 mEq/L (ref 19–32)
Chloride: 101 mEq/L (ref 96–112)
GFR: 113.85 mL/min (ref >60.00)
Glucose, Bld: 191 mg/dL — ABNORMAL HIGH (ref 70–99)
Potassium: 4 mEq/L (ref 3.5–5.1)
Sodium: 134 mEq/L — ABNORMAL LOW (ref 135–145)

## 2013-01-11 LAB — HEPATIC FUNCTION PANEL
ALT: 116 U/L — ABNORMAL HIGH (ref 0–53)
AST: 82 U/L — ABNORMAL HIGH (ref 0–37)
Albumin: 4.1 g/dL (ref 3.5–5.2)
Alkaline Phosphatase: 69 U/L (ref 39–117)

## 2013-01-11 LAB — HEMOGLOBIN A1C: Hgb A1c MFr Bld: 8.5 % — ABNORMAL HIGH (ref 4.6–6.5)

## 2013-01-11 LAB — LIPID PANEL: HDL: 25.5 mg/dL — ABNORMAL LOW (ref >39.00)

## 2013-01-11 MED ORDER — ATENOLOL 100 MG PO TABS: 100.0000 mg | ORAL_TABLET | Freq: Every day | ORAL | Status: DC

## 2013-01-11 NOTE — Progress Notes (Signed)
Subjective:    Patient ID: James [name], male    DOB: April 22, 1981, 32 y.o.   MRN: [id number]  HPI 32 y/o WM here for a follow up eval and CPX... he has a problem w/ Obesity & elevated lipids and blood sugar...  ~  May 30, 2011:  33mo ROV & essentially no change> weight still 324# and FLP no better on PRAV40, plus his LFTs are sl elev now, therefore advised to STOP the Pravastatin & get on diet, exercise, wt reduction program...    HBP> on ToprolXL50 & BP= 126/84; he is asymptomatic- w/o HA, CP, palpit, SOB, swelling, etc...    Hyperlipid> on Prav40 for the last 33mo; FLP today is NO BETTER & LFTs sl elev, therefore stop med for washout & needs better diet, exerc, lose wt!!!    Borderline DM> on diet alone; prev A1c=6.2; labs today w/ BS119, A1c6.1; continue diet efforts...    Obesity> weight is 324# no change from 33mo ago & this is his max weight; we reviewed diet, exercise, etc...  ~  November 28, 2011:  33mo ROV & James [name] feels well but wt is essent the same & while labs today showed some improvement in FLP on diet alone, his BS/ A1c are worse & he will need to start Metformin;  He also c/o some palpit on the Metoprolol he says describing a rapid regular beating that tapers off slowly to normal; we decided to change his BBlocker to ATENOLOL 100mg /d & follow his symptoms...    HBP, palpit> on ToprolXL50 & BP= 122/80 but he's noted some palpit- rapid regular & tapers off slowly to normal; we decided to change BBlocker to ATENOLOL 100mg /d...    Hyperlipid> off Prav40 for the last 33mo due to elev LFTs; he is on diet alone but hasn't lost any wt; FLP is actually better now w/ [id number] & [id number]; rec to continue diet & exercise!    Borderline DM> on diet alone; wt up 1# to 254#; labs are worse w/ FBS=165, A1c=7.0; we decided to START METFORMIN 500mg  Qam, plus much better diet & exercise!!!    Obesity> weight is 325# up 1# from 33mo ago & this is his max weight; we reviewed diet, exercise, etc; he  understands that wt reduction is the key to getting off meds...  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 We reviewed the following medical problems during today's office visit >>      HBP, palpit> on Aten100 & BP= 140/90 and he notes better palpit control on this BBlocker; feels well w/o CP, palpit, SOB, edema, etc; better control requires wt reduction!    Hyperlipid> on diet alone due to hx elev LFTs; he hasn't lost any wt; FLP shows TChol 202, TG 282, HDL 26, LDL 126; needs better diet, wt reduction & trial [id number]...    Borderline DM> on diet alone (he never refilled the Metform); wt up 2# to 326#; labs are worse w/ FBS=191, A1c=8.5; we decided to re-START METFORMIN 500mg Bid, plus much better diet & exercise!!!    Obesity> weight is 326# as noted & this is his max weight; we reviewed diet, exercise, etc; he understands that wt reduction is the key to getting off meds...    Abnormal LFTs> he likely has NAFLD w/ labs 4/14 showing SGOT=82, SGPT=116;  he understands that weight reduction is the only effective therapy...  We reviewed prob list, meds, xrays and labs> see below for updates >> he declines the Flu vaccine...  CXR 4/14 showed normal heart size, clear lungs, wnl... EKG 4/14 showed NSR, rate73, wnl, NAD... LABS 4/14:  FLP- not at goals on diet alone w/ TG=282 HDL=26;  Chems- ok x BS=191, A1c=8.5, & elev LFTs;  CBC- wnl;  TSH=1.30;  UA=clear...            Problem List:  PHYSICAL EXAMINATION (ICD-V70.0) - good general health... feels well, no new complaints or concerns...  HYPERTENSION, BORDERLINE (ICD-401.9) - BP's here 140's-160's / 80's-100> surely related to his weight> discussed diet & exercise needed. ~  3/12:  BP= 140/94 & pt rec to start METOPROLOL ER 50mg /d; monitor BP at home. ~  9/12:  BP= 126/84, continue same med, better  diet, get wt down... ~  3/13:  BP= 122/80 but he is c/o palpit- rapid, regular, & tapers off to normal; he thinks related to Metoprolol Rx therefore switched to ATENOLOL 100mg /d, monitor BP & palpit. ~  4/14:  on Aten100 & BP= 140/90 and he notes better palpit control on this BBlocker; feels well w/o CP, palpit, SOB, edema, etc; better control requires wt reduction!   MIXED HYPERLIPIDEMIA (ICD-272.2) >> ~  FLP 7/08 on diet showed TChol 203, TG 141, HDL 28, LDL 154... rec diet + exercise... ~  FLP 8/09 on diet showed TChol 219, TG 127, HDL 26, LDL 163... rec> start PRAV40, but he prefers diet alone. ~  FLP 9/10 on diet showed TChol 205, TG 183, HDL 27, LDL 153 ~  FLP 8/11 on diet showed TChol 190, TG 228, HDL 33, LDL 114 ~  FLP 3/12 on diet showed TChol 213, TG 192, HDL 34, LDL 150... rec to start PRAV40. ~  FLP 9/12 on Prav40 showed TChol 216, TG 180, HDL 34, LDL 157... No response & LFTs sl elev, therefore STOP Prav Rx. ~  FLP 3/13 on diet showed TChol 211, TG 228, HDL 36, LDL 123... rec better low chol/ low fat diet & get wt down! ~  FLP 4/14 on diet alone showed TChol 202, TG 282, HDL 26, LDL 126... rec to start FENOFIBRATE 160mg /d w/ ROV 3-39mo & recheck.  DIABETES MELLITUS, BORDERLINE (ICD-790.29) >> ~  labs 7/08 showed BS= 116... discussed weight reduction via low carb diet... ~  labs 8/09 showed BS= 103 ~  labs 9/10 showed BS= 120... he needs to do better. ~  labs 8/11 showed BS= 110, A1c= 5.8 ~  labs 3/12 showed BS= 120, A1c= 6.2.Marland Kitchen. he want to continue diet efforts. ~  Labs 9/12 showed BS= 119, A1c= 6.1.Marland KitchenMarland Kitchen He remains on diet alone. ~  Labs 3/13 on diet alone showed BS= 165, A1c= 7.0.Marland KitchenMarland Kitchen rec to start METFORMIN 500mg Qam & better diet (he didn't refill this med when he lost his job). ~  Labs 4/14 on diet alone showed BS= 191, A1c= 8.5.Marland KitchenMarland Kitchen rec to start METFORMIN 500mg Bid + diet...  OBESITY (ICD-278.00) - difficult for him to diet/ exercise due to his work schedule... ~  weight 8/09 = 295#,  up 3# in the last year... ~  weight 9/10 = 298# ~  weight 8/11 = 314# ~  weight 3.12 = 324# ~  Weight 9/12 = 324# ~  Weight 3/13 = 325# ~  Weight 4/14 = 326#  ABNORMAL LFTs >> ~  Labs 9/12 showed SGOT= 56, SGPT= 82... Pravachol stopped, advised  to lose wt... ~  Labs 4/14 showed SGOT= 82, SGPT= 116... Not on statin, we discussed need for diet & wt reduction.   History reviewed. No pertinent past surgical history.   Outpatient Encounter Prescriptions as of 01/11/2013  Medication Sig Dispense Refill  . atenolol (TENORMIN) 100 MG tablet Take 1 tablet (100 mg total) by mouth daily.  30 tablet  11   No facility-administered encounter medications on file as of 01/11/2013.    No Known Allergies   Current Medications, Allergies, Past Medical History, Past Surgical History, Family History, and Social History were reviewed in [employer] [facility] record.    Review of Systems        See HPI - all other systems neg except as noted...  The patient denies anorexia, fever, weight loss, weight gain, vision loss, decreased hearing, hoarseness, chest pain, syncope, dyspnea on exertion, peripheral edema, prolonged cough, headaches, hemoptysis, abdominal pain, melena, hematochezia, severe indigestion/heartburn, hematuria, incontinence, muscle weakness, suspicious skin lesions, transient blindness, difficulty walking, depression, unusual weight change, abnormal bleeding, enlarged lymph nodes, and angioedema.     Objective:   Physical Exam    WD, Obese, 32 y/o WM in NAD... GENERAL:  Alert & oriented; pleasant & cooperative... HEENT:  [place]/AT, EOM-wnl, PERRLA, Fundi-benign, EACs-clear, TMs-wnl, NOSE-clear, THROAT-clear & wnl. NECK:  Supple w/ full ROM; no JVD; normal carotid impulses w/o bruits; no thyromegaly or nodules palpated; no lymphadenopathy. CHEST:  Clear to P & A; without wheezes/ rales/ or rhonchi. HEART:  Regular Rhythm; without murmurs/ rubs/ or gallops. ABDOMEN:  Soft &  nontender; normal bowel sounds; no organomegaly or masses detected. EXT: without deformities or arthritic changes; no varicose veins/ venous insuffic/ or edema. NEURO:  CN's intact; motor testing normal; sensory testing normal; gait normal & balance OK. DERM:  No lesions noted; no rash etc...  RADIOLOGY DATA:  Reviewed in the EPIC EMR & discussed w/ the patient...  LABORATORY DATA:  Reviewed in the EPIC EMR & discussed w/ the patient...   Assessment & Plan:    HBP & Palpitations>  Controlled on the Aten100 but he needs to lose the wt to keep from having to incr meds...  HYPERLIPID>  On diet alone but FLP w/ elev TG & low HDL (plus sl elev LDL); Rec trial FENOFIBRATE 160...  DM>  He stopped the Metform500 when he lost his job (he never called & didn't realize it is avail for <$10/mo; labs today show worsening w/ BS=191, A1c=8.5 & rec to start METFORM500Bid...  OBESITY>  Weight is unchanged ~ 326#; we reviewed wt reduction strategies...  Abn LFTs>  They remain elev off the statin etc; likely NAFLD- we discussed the need for wt loss...   Patient's Medications  New Prescriptions   No medications on file  Previous Medications   No medications on file  Modified Medications   Modified Medication Previous Medication   ATENOLOL (TENORMIN) 100 MG TABLET atenolol (TENORMIN) 100 MG tablet      Take 1 tablet (100 mg total) by mouth daily.    Take 1 tablet (100 mg total) by mouth daily.  Discontinued Medications   No medications on file

## 2013-01-11 NOTE — Patient Instructions (Addendum)
Today we updated your med list in our EPIC system...    Continue your current medications the same...    We refilled your Atenolol today...  Today we did your follow up CXR, EKG, & FASTING blood work...    We will contact you w/ the results when available...     We will then decide regarding any DM meds etc...  Let's get on track w/ our diet & exercise program...  Call for any questions...  Let's plan a brief follow up visit in 43mo, sooner if needed for problems.Marland KitchenMarland Kitchen

## 2013-01-14 ENCOUNTER — Telehealth: Payer: Self-pay | Admitting: Pulmonary Disease

## 2013-01-14 MED ORDER — METFORMIN HCL 500 MG PO TABS: 500.0000 mg | ORAL_TABLET | Freq: Two times a day (BID) | ORAL | Status: DC

## 2013-01-14 MED ORDER — PRAVASTATIN SODIUM 40 MG PO TABS: 40.0000 mg | ORAL_TABLET | Freq: Every day | ORAL | Status: DC

## 2013-01-14 NOTE — Telephone Encounter (Signed)
Notes Recorded by Michele Mcalpine, MD on 01/11/2013 at 5:48 PM Please notify patient>  FLP not at goals on diet alone; Rec- start PRAV40 while we are waiting for diet/ exercise to result in substantial wt loss... Chems show progression to full blown DM- BS=191, A1c=8.5, & needs meds; Rec- start METFORM500Bid + low carb diet & wt reduction... LFTs show elev SGOT/ SGPT- c/w fatty liver dis & the only treatment is weight loss!!! CBC, Thyroid, & Urine are all clear/ wnl... Needs ROV w/ fasting labs in 3-41months on the new meds...   I spoke with patient about results and he verbalized understanding and had no questions

## 2013-01-14 NOTE — Telephone Encounter (Signed)
Duplicate message see earlier phone note

## 2013-04-17 ENCOUNTER — Ambulatory Visit (INDEPENDENT_AMBULATORY_CARE_PROVIDER_SITE_OTHER): Payer: 59 | Admitting: Pulmonary Disease

## 2013-04-17 ENCOUNTER — Encounter: Payer: Self-pay | Admitting: Pulmonary Disease

## 2013-04-17 ENCOUNTER — Other Ambulatory Visit (INDEPENDENT_AMBULATORY_CARE_PROVIDER_SITE_OTHER): Payer: 59

## 2013-04-17 VITALS — BP 138/80 | HR 82 | Temp 98.5°F | Ht 76.0 in | Wt 315.4 lb

## 2013-04-17 DIAGNOSIS — E782 Mixed hyperlipidemia: Secondary | ICD-10-CM

## 2013-04-17 DIAGNOSIS — E669 Obesity, unspecified: Secondary | ICD-10-CM

## 2013-04-17 DIAGNOSIS — K7689 Other specified diseases of liver: Secondary | ICD-10-CM

## 2013-04-17 DIAGNOSIS — I10 Essential (primary) hypertension: Secondary | ICD-10-CM

## 2013-04-17 DIAGNOSIS — K760 Fatty (change of) liver, not elsewhere classified: Secondary | ICD-10-CM

## 2013-04-17 DIAGNOSIS — R7309 Other abnormal glucose: Secondary | ICD-10-CM

## 2013-04-17 LAB — BASIC METABOLIC PANEL
CO2: 24 mEq/L (ref 19–32)
Calcium: 9.3 mg/dL (ref 8.4–10.5)
Chloride: 103 mEq/L (ref 96–112)
Potassium: 4.2 mEq/L (ref 3.5–5.1)
Sodium: 135 mEq/L (ref 135–145)

## 2013-04-17 LAB — HEPATIC FUNCTION PANEL
AST: 79 U/L — ABNORMAL HIGH (ref 0–37)
Alkaline Phosphatase: 77 U/L (ref 39–117)
Bilirubin, Direct: 0.1 mg/dL (ref 0.0–0.3)
Total Protein: 7.7 g/dL (ref 6.0–8.3)

## 2013-04-17 LAB — LIPID PANEL
Total CHOL/HDL Ratio: 5
Triglycerides: 342 mg/dL — ABNORMAL HIGH (ref 0.0–149.0)

## 2013-04-17 NOTE — Progress Notes (Signed)
Subjective:    Patient ID: James [name], male    DOB: 1981-01-28, 32 y.o.   MRN: [id number]  HPI 32 y/o WM here for a follow up eval and CPX... he has a problem w/ Obesity & elevated lipids and blood sugar...  ~  May 30, 2011:  82mo ROV & essentially no change> weight still 324# and FLP no better on PRAV40, plus his LFTs are sl elev now, therefore advised to STOP the Pravastatin & get on diet, exercise, wt reduction program...    HBP> on ToprolXL50 & BP= 126/84; he is asymptomatic- w/o HA, CP, palpit, SOB, swelling, etc...    Hyperlipid> on Prav40 for the last 82mo; FLP today is NO BETTER & LFTs sl elev, therefore stop med for washout & needs better diet, exerc, lose wt!!!    Borderline DM> on diet alone; prev A1c=6.2; labs today w/ BS119, A1c6.1; continue diet efforts...    Obesity> weight is 324# no change from 82mo ago & this is his max weight; we reviewed diet, exercise, etc...  ~  November 28, 2011:  82mo ROV & [name] feels well but wt is essent the same & while labs today showed some improvement in FLP on diet alone, his BS/ A1c are worse & he will need to start Metformin;  He also c/o some palpit on the Metoprolol he says describing a rapid regular beating that tapers off slowly to normal; we decided to change his BBlocker to ATENOLOL 100mg /d & follow his symptoms...    HBP, palpit> on ToprolXL50 & BP= 122/80 but he's noted some palpit- rapid regular & tapers off slowly to normal; we decided to change BBlocker to ATENOLOL 100mg /d...    Hyperlipid> off Prav40 for the last 82mo due to elev LFTs; he is on diet alone but hasn't lost any wt; FLP is actually better now w/ [name] & [name]; rec to continue diet & exercise!    Borderline DM> on diet alone; wt up 1# to 254#; labs are worse w/ FBS=165, A1c=7.0; we decided to START METFORMIN 500mg  Qam, plus much better diet & exercise!!!    Obesity> weight is 325# up 1# from 82mo ago & this is his max weight; we reviewed diet, exercise, etc; he  understands that wt reduction is the key to getting off meds...  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 We reviewed the following medical problems during today's office visit >>     HBP, palpit> on Aten100 & BP= 140/90 and he notes better palpit control on this BBlocker; feels well w/o CP, palpit, SOB, edema, etc; better control requires wt reduction!    Hyperlipid> on diet alone due to hx elev LFTs; he hasn't lost any wt; FLP shows TChol 202, TG 282, HDL 26, LDL 126; needs better diet, wt reduction & trial FENO160=> ?we called in PRAV40 instead.    Borderline DM> on diet alone (he never refilled the Metform); wt up 2# to 326#; labs are worse w/ FBS=191, A1c=8.5; we decided to re-START METFORMIN 500mg Bid, plus much better diet & exercise!!!    Obesity> weight is 326# as noted & this is his max weight; we reviewed diet, exercise, etc; he understands that wt reduction is the key to getting off meds...    Abnormal LFTs> he likely has NAFLD w/ labs  4/14 showing SGOT=82, SGPT=116; he understands that weight reduction is the only effective therapy... We reviewed prob list, meds, xrays and labs> see below for updates >> he declines the Flu vaccine...  CXR 4/14 showed normal heart size, clear lungs, wnl... EKG 4/14 showed NSR, rate73, wnl, NAD... LABS 4/14:  FLP- not at goals on diet alone w/ TG=282 HDL=26;  Chems- ok x BS=191, A1c=8.5, & elev LFTs;  CBC- wnl;  TSH=1.30;  UA=clear...   ~  April 17, 2013:  72mo ROV & [name] has done a great job on diet & exercise, down 10# to 315# today, dieting & exercising- keep up the good work...    HBP, palpit> on Aten100 & BP= 138/80 and he notes better palpit control on this BBlocker; feels well w/o CP, palpit, SOB, edema, etc...    Hyperlipid> on Prav40 (?we prev wanted Feno160? too $$?); FLP 7/14 shows TChol 158, TG  342, HDL 29, LDL 71; if not improving he will need fibrate added!    DM> on Metform500Bid; Labs 7/14 show BS=290, A1c= 11.0 which is disappointing; decided to add GLIMEP4mg Bid, continue losing weight!    Obesity> weight is 315# today which is down 10#- great job; we reviewed diet, exercise, wt reduction strategies...    Abnormal LFTs> he likely has NAFLD w/ labs 4/14 showing SGOT=82, SGPT=116; wt down 10# & LFTs show We reviewed prob list, meds, xrays and labs> see below for updates >>  LABS 7/14:  FLP- at goals x TG=342;  Chems- ok x BS=290, A1c=11.0, sl incr LFTs;             Problem List:  PHYSICAL EXAMINATION (ICD-V70.0) - good general health... feels well, no new complaints or concerns...  HYPERTENSION, BORDERLINE (ICD-401.9) - BP's here 140's-160's / 80's-100> surely related to his weight> discussed diet & exercise needed. ~  3/12:  BP= 140/94 & pt rec to start METOPROLOL ER 50mg /d; monitor BP at home. ~  9/12:  BP= 126/84, continue same med, better diet, get wt down... ~  3/13:  BP= 122/80 but he is c/o palpit- rapid, regular, & tapers off to normal; he thinks related to Metoprolol Rx therefore switched to ATENOLOL 100mg /d, monitor BP & palpit. ~  4/14: on Aten100 & BP= 140/90 and he notes better palpit control on this BBlocker; feels well w/o CP, palpit, SOB, edema, etc; better control requires wt reduction!  ~  7/14: on Aten100 & BP= 138/80 and he notes better palpit control on this BBlocker; feels well w/o CP, palpit, SOB, edema, etc  MIXED HYPERLIPIDEMIA (ICD-272.2) >> ~  FLP 7/08 on diet showed TChol 203, TG 141, HDL 28, LDL 154... rec diet + exercise... ~  FLP 8/09 on diet showed TChol 219, TG 127, HDL 26, LDL 163... rec> start PRAV40, but he prefers diet alone. ~  FLP 9/10 on diet showed TChol 205, TG 183, HDL 27, LDL 153 ~  FLP 8/11 on diet showed TChol 190, TG 228, HDL 33, LDL 114 ~  FLP 3/12 on diet showed TChol 213, TG 192, HDL 34, LDL 150... rec to start PRAV40. ~  FLP  9/12 on Prav40 showed TChol 216, TG 180, HDL 34, LDL 157... No response & LFTs sl elev, therefore STOP Prav Rx. ~  FLP 3/13 on diet showed TChol 211, TG 228, HDL 36, LDL 123... rec better low chol/ low fat diet & get wt down! ~  FLP 4/14 on diet alone showed TChol 202, TG 282, HDL 26, LDL 126.Marland KitchenMarland Kitchen  rec to start FENOFIBRATE 160mg /d w/ ROV 3-63mo & recheck. ~  FLP 7/14 on Prav40 showed  TChol 158, TG 342, HDL 29, LDL 71... If TG not improved w/ diet he will need fibrate added...  DIABETES MELLITUS, BORDERLINE (ICD-790.29) >> ~  labs 7/08 showed BS= 116... discussed weight reduction via low carb diet... ~  labs 8/09 showed BS= 103 ~  labs 9/10 showed BS= 120... he needs to do better. ~  labs 8/11 showed BS= 110, A1c= 5.8 ~  labs 3/12 showed BS= 120, A1c= 6.2.Marland Kitchen. he want to continue diet efforts. ~  Labs 9/12 showed BS= 119, A1c= 6.1.Marland KitchenMarland Kitchen He remains on diet alone. ~  Labs 3/13 on diet alone showed BS= 165, A1c= 7.0.Marland KitchenMarland Kitchen rec to start METFORMIN 500mg Qam & better diet (he didn't refill this med when he lost his job). ~  Labs 4/14 on diet alone showed BS= 191, A1c= 8.5.Marland KitchenMarland Kitchen rec to start METFORMIN 500mg Bid + diet... ~  Labs 7/14 on Metform500Bid showed BS= 290, A1c= 11.0  OBESITY (ICD-278.00) - difficult for him to diet/ exercise due to his work schedule... ~  weight 8/09 = 295#, up 3# in the last year... ~  weight 9/10 = 298# ~  weight 8/11 = 314# ~  weight 3.12 = 324# ~  Weight 9/12 = 324# ~  Weight 3/13 = 325# ~  Weight 4/14 = 326# ~  Weight 7/14 = 315#  ABNORMAL LFTs >> ~  Labs 9/12 showed SGOT= 56, SGPT= 82... Pravachol stopped, advised to lose wt... ~  Labs 4/14 showed SGOT= 82, SGPT= 116... Not on statin, we discussed need for diet & wt reduction. ~  Labs 7/14 showed SGOT= 79, SGPT= 104   No past surgical history on file.   Outpatient Encounter Prescriptions as of 04/17/2013  Medication Sig Dispense Refill  . atenolol (TENORMIN) 100 MG tablet Take 1 tablet (100 mg total) by mouth daily.  90  tablet  3  . metFORMIN (GLUCOPHAGE) 500 MG tablet Take 1 tablet (500 mg total) by mouth 2 (two) times daily with a meal.  60 tablet  5  . pravastatin (PRAVACHOL) 40 MG tablet Take 1 tablet (40 mg total) by mouth daily.  30 tablet  5  . [DISCONTINUED] metFORMIN (GLUCOPHAGE) 500 MG tablet Take 1 tablet (500 mg total) by mouth daily with breakfast.  30 tablet  11   No facility-administered encounter medications on file as of 04/17/2013.    No Known Allergies   Current Medications, Allergies, Past Medical History, Past Surgical History, Family History, and Social History were reviewed in [employer] [facility] record.    Review of Systems        See HPI - all other systems neg except as noted...  The patient denies anorexia, fever, weight loss, weight gain, vision loss, decreased hearing, hoarseness, chest pain, syncope, dyspnea on exertion, peripheral edema, prolonged cough, headaches, hemoptysis, abdominal pain, melena, hematochezia, severe indigestion/heartburn, hematuria, incontinence, muscle weakness, suspicious skin lesions, transient blindness, difficulty walking, depression, unusual weight change, abnormal bleeding, enlarged lymph nodes, and angioedema.     Objective:   Physical Exam    WD, Obese, 32 y/o WM in NAD... GENERAL:  Alert & oriented; pleasant & cooperative... HEENT:  [place]/AT, EOM-wnl, PERRLA, Fundi-benign, EACs-clear, TMs-wnl, NOSE-clear, THROAT-clear & wnl. NECK:  Supple w/ full ROM; no JVD; normal carotid impulses w/o bruits; no thyromegaly or nodules palpated; no lymphadenopathy. CHEST:  Clear to P & A; without wheezes/ rales/ or rhonchi. HEART:  Regular Rhythm;  without murmurs/ rubs/ or gallops. ABDOMEN:  Soft & nontender; normal bowel sounds; no organomegaly or masses detected. EXT: without deformities or arthritic changes; no varicose veins/ venous insuffic/ or edema. NEURO:  CN's intact; motor testing normal; sensory testing normal; gait normal & balance  OK. DERM:  No lesions noted; no rash etc...  RADIOLOGY DATA:  Reviewed in the EPIC EMR & discussed w/ the patient...  LABORATORY DATA:  Reviewed in the EPIC EMR & discussed w/ the patient...   Assessment & Plan:    HBP & Palpitations>  Controlled on the Aten100 but he needs to lose the wt to keep from having to incr meds...  HYPERLIPID>  On Prav40 but FLP w/ elev TG & low HDL; he couldn't afford the Feno160 prev; told he will need this added if he can't get the numbers looking better!  DM>  On METFORM500Bid & he's lost a few lbs but numbers are worse!  Needs to continue the Metform, add Glimep 4mg Bid & continue diet efforts etc...  OBESITY>  Weight is down 10# to 315#; we reviewed wt reduction strategies...  Abn LFTs>  They remain elev off the statin & no worse on Prav40; likely NAFLD- we discussed the need for wt loss...   Patient's Medications  New Prescriptions   GLIMEPIRIDE (AMARYL) 4 MG TABLET    Take 1 tablet (4 mg total) by mouth 2 (two) times daily.  Previous Medications   No medications on file  Modified Medications   Modified Medication Previous Medication   ATENOLOL (TENORMIN) 100 MG TABLET atenolol (TENORMIN) 100 MG tablet      Take 1 tablet (100 mg total) by mouth daily.    Take 1 tablet (100 mg total) by mouth daily.   METFORMIN (GLUCOPHAGE) 500 MG TABLET metFORMIN (GLUCOPHAGE) 500 MG tablet      Take 1 tablet (500 mg total) by mouth 2 (two) times daily with a meal.    Take 1 tablet (500 mg total) by mouth 2 (two) times daily with a meal.   PRAVASTATIN (PRAVACHOL) 40 MG TABLET pravastatin (PRAVACHOL) 40 MG tablet      Take 1 tablet (40 mg total) by mouth daily.    Take 1 tablet (40 mg total) by mouth daily.  Discontinued Medications   METFORMIN (GLUCOPHAGE) 500 MG TABLET    Take 1 tablet (500 mg total) by mouth daily with breakfast.

## 2013-04-17 NOTE — Patient Instructions (Addendum)
Today we updated your med list in our EPIC system...    Continue your current medications the same...  Today we did your follow up FASTING blood work...    We will contact you w/ the results when available...   Keep up the great job of diet & wt reduction...  Call for any questions...  Let's plan a follow up visit in 62mo, sooner if needed for problems.Marland KitchenMarland Kitchen

## 2013-04-18 ENCOUNTER — Telehealth: Payer: Self-pay | Admitting: Pulmonary Disease

## 2013-04-18 MED ORDER — PRAVASTATIN SODIUM 40 MG PO TABS: 40.0000 mg | ORAL_TABLET | Freq: Every day | ORAL | Status: DC

## 2013-04-18 MED ORDER — METFORMIN HCL 500 MG PO TABS: 500.0000 mg | ORAL_TABLET | Freq: Two times a day (BID) | ORAL | Status: DC

## 2013-04-18 MED ORDER — ATENOLOL 100 MG PO TABS: 100.0000 mg | ORAL_TABLET | Freq: Every day | ORAL | Status: DC

## 2013-04-18 NOTE — Telephone Encounter (Signed)
Pt returned call. I advised per msg above. Nothing further needed per pt. James [name]

## 2013-04-18 NOTE — Telephone Encounter (Signed)
LMTCB x1--RX's sent

## 2013-04-19 ENCOUNTER — Telehealth: Payer: Self-pay | Admitting: Pulmonary Disease

## 2013-04-19 MED ORDER — GLIMEPIRIDE 4 MG PO TABS: 4.0000 mg | ORAL_TABLET | Freq: Two times a day (BID) | ORAL | Status: DC

## 2013-04-19 NOTE — Telephone Encounter (Signed)
Pt returned Leigh's call.  Pt set a date w/ SN for 07/22/13 @ 11:30 for ROV 3 mos.  Pt verbalized understanding & states nothing further is needed at this time.  Antionette Fairy

## 2013-04-19 NOTE — Telephone Encounter (Signed)
appt has been scheduled. Nothing further is needed.

## 2013-04-19 NOTE — Telephone Encounter (Signed)
Pt is aware of lab results per SN and is aware of new meds sent to the pharmacy.  i called pt back and lmomtcb to schedule a 3 month rov with SN.

## 2013-07-08 ENCOUNTER — Telehealth: Payer: Self-pay | Admitting: Pulmonary Disease

## 2013-07-08 NOTE — Telephone Encounter (Signed)
I spoke with pt. RX was sent 04/18/13 x 5 refills Pt reports when he calls RX # in it is stating it has 0 refills. I advised him he needed to speak with the pharmacy bc it would be a new RX. He will call [facility]. I advised him will also call [facility] to ensure they have RX. I spoke with [facility] and was advised they do have RX on file. Nothing further needed

## 2013-07-17 ENCOUNTER — Ambulatory Visit: Payer: 59 | Admitting: Pulmonary Disease

## 2013-07-22 ENCOUNTER — Ambulatory Visit (INDEPENDENT_AMBULATORY_CARE_PROVIDER_SITE_OTHER): Payer: 59

## 2013-07-22 ENCOUNTER — Encounter: Payer: Self-pay | Admitting: Pulmonary Disease

## 2013-07-22 ENCOUNTER — Ambulatory Visit (INDEPENDENT_AMBULATORY_CARE_PROVIDER_SITE_OTHER): Payer: 59 | Admitting: Pulmonary Disease

## 2013-07-22 VITALS — BP 124/80 | HR 74 | Temp 98.4°F | Ht 76.0 in | Wt 311.8 lb

## 2013-07-22 DIAGNOSIS — I10 Essential (primary) hypertension: Secondary | ICD-10-CM

## 2013-07-22 DIAGNOSIS — Z113 Encounter for screening for infections with a predominantly sexual mode of transmission: Secondary | ICD-10-CM

## 2013-07-22 DIAGNOSIS — R7309 Other abnormal glucose: Secondary | ICD-10-CM

## 2013-07-22 LAB — BASIC METABOLIC PANEL
BUN: 10 mg/dL (ref 6–23)
CO2: 23 mEq/L (ref 19–32)
Chloride: 107 mEq/L (ref 96–112)
Creatinine, Ser: 0.9 mg/dL (ref 0.4–1.5)
Glucose, Bld: 92 mg/dL (ref 70–99)

## 2013-07-22 LAB — HEMOGLOBIN A1C: Hgb A1c MFr Bld: 6.2 % (ref 4.6–6.5)

## 2013-07-22 MED ORDER — GLUCOSE BLOOD VI STRP: ORAL_STRIP | Status: DC

## 2013-07-22 NOTE — Patient Instructions (Signed)
Today we updated your med list in our EPIC system...    Continue your current medications the same...  Today we did your follow up DM labs and the additional tests you requested...    We will contact you w/ the results when available...   We gave you a Diabetic meter to monitor your sugars at home...  Keep up the great job w/ diet, exercise & wt reduction!!!  Call for any questions...  Let's plan a follow up visit in 59mo, sooner if needed for problems.Marland KitchenMarland Kitchen

## 2013-07-23 LAB — GC PROBE AMPLIFICATION, URINE: GC Probe Amp, Urine: NEGATIVE

## 2013-07-23 LAB — STD PANEL
HIV: NONREACTIVE
Hepatitis B Surface Ag: NEGATIVE

## 2013-07-24 LAB — CHLAMYDIA TRACHOMATIS, PROBE AMP: CT Probe RNA: NEGATIVE

## 2013-07-25 ENCOUNTER — Other Ambulatory Visit: Payer: Self-pay

## 2013-07-28 NOTE — Progress Notes (Signed)
Subjective:    Patient ID: James [name], male    DOB: [birth year], 32 y.o.   MRN: [id number]  HPI 32 y/o WM here for a follow up eval and CPX... he has a problem w/ Obesity & elevated lipids and blood sugar...  ~  November 28, 2011:  44mo ROV & James [name] feels well but wt is essent the same & while labs today showed some improvement in FLP on diet alone, his BS/ A1c are worse & he will need to start Metformin;  He also c/o some palpit on the Metoprolol he says describing a rapid regular beating that tapers off slowly to normal; we decided to change his BBlocker to ATENOLOL 100mg /d & follow his symptoms...    HBP, palpit> on ToprolXL50 & BP= 122/80 but he's noted some palpit- rapid regular & tapers off slowly to normal; we decided to change BBlocker to ATENOLOL 100mg /d...    Hyperlipid> off Prav40 for the last 44mo due to elev LFTs; he is on diet alone but hasn't lost any wt; FLP is actually better now w/ [name] & [name]; rec to continue diet & exercise!    Borderline DM> on diet alone; wt up 1# to 254#; labs are worse w/ FBS=165, A1c=7.0; we decided to START METFORMIN 500mg  Qam, plus much better diet & exercise!!!    Obesity> weight is 325# up 1# from 44mo ago & this is his max weight; we reviewed diet, exercise, etc; he understands that wt reduction is the key to getting off meds...  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 We reviewed the following medical problems during today's office visit >>     HBP, palpit> on Aten100 & BP= 140/90 and he notes better palpit control on this BBlocker; feels well w/o CP, palpit, SOB, edema, etc; better control requires wt reduction!    Hyperlipid> on diet alone due to hx elev LFTs; he hasn't lost any wt; FLP shows TChol 202, TG 282, HDL 26, LDL 126; needs better diet, wt reduction & trial FENO160=> ?we called in  PRAV40 instead.    Borderline DM> on diet alone (he never refilled the Metform); wt up 2# to 326#; labs are worse w/ FBS=191, A1c=8.5; we decided to re-START METFORMIN 500mg Bid, plus much better diet & exercise!!!    Obesity> weight is 326# as noted & this is his max weight; we reviewed diet, exercise, etc; he understands that wt reduction is the key to getting off meds...    Abnormal LFTs> he likely has NAFLD w/ labs 4/14 showing SGOT=82, SGPT=116; he understands that weight reduction is the only effective therapy... We reviewed prob list, meds, xrays and labs> see below for updates >> he declines the Flu vaccine...  CXR 4/14 showed normal heart size, clear lungs, wnl... EKG 4/14 showed NSR, rate73, wnl, NAD... LABS 4/14:  FLP- not at goals on diet alone w/ TG=282 HDL=26;  Chems- ok x BS=191, A1c=8.5, & elev LFTs;  CBC- wnl;  TSH=1.30;  UA=clear...   ~  April 17, 2013:  65mo ROV & James [name] has done a great job on diet & exercise, down 10# to 315# today, dieting & exercising- keep up the good work...    HBP, palpit> on Aten100 & BP= 138/80 and he notes better palpit control on this BBlocker; feels well w/o  CP, palpit, SOB, edema, etc...    Hyperlipid> on Prav40 (?we prev wanted Feno160? too $$?); FLP 7/14 shows TChol 158, TG 342, HDL 29, LDL 71; if not improving he will need fibrate added!    DM> on Metform500Bid; Labs 7/14 show BS=290, A1c= 11.0 which is disappointing; decided to add GLIMEP4mg Bid, continue losing weight!    Obesity> weight is 315# today which is down 10#- great job; we reviewed diet, exercise, wt reduction strategies...    Abnormal LFTs> he likely has NAFLD w/ labs 4/14 showing SGOT=82, SGPT=116; wt down 10# & LFTs show We reviewed prob list, meds, xrays and labs> see below for updates >>  LABS 7/14:  FLP- at goals x TG=342;  Chems- ok x BS=290, A1c=11.0, sl incr LFTs...  ~  July 22, 2013:  28mo ROV & James [name] has lost a few more lbs down to 312#; he is requesting STD panel in addition  to his regular labs;  We reviewed the following medical problems during today's office visit >>     HBP, palpit> on Aten100 & BP= 124/80 and he notes better palpit control on this BBlocker; feels well w/o CP, palpit, SOB, edema, etc...    Hyperlipid> on Prav40 (?we prev wanted Feno160? too $$?); FLP 7/14 shows TChol 158, TG 342, HDL 29, LDL 71; needs to get wt down- if not improving he will need fibrate added!    DM> on Metform500Bid & Glimep4Bid; Labs11/14 show BS=92, A1c= 6.2 which is a fabulous improvement- continue losing weight! rec to decr eve glim to 1/2 tab.    Obesity> weight is 312# today which is down 3# more; we reviewed diet, exercise, wt reduction strategies...    Abnormal LFTs> he likely has NAFLD w/ labs 4/14 showing SGOT=82, SGPT=116; wt down 10# & LFTs show We reviewed prob list, meds, xrays and labs> see below for updates >> he needs Accucheck machine & testing supplies...  LABS 11/14:  Chems- wnl w/ BS=92 A1c=6.2.Marland KitchenMarland Kitchen STD panel done at pt request is all neg...           Problem List:  PHYSICAL EXAMINATION (ICD-V70.0) - good general health... feels well, no new complaints or concerns...  HYPERTENSION, BORDERLINE (ICD-401.9) - BP's here 140's-160's / 80's-100> surely related to his weight> discussed diet & exercise needed. ~  3/12:  BP= 140/94 & pt rec to start METOPROLOL ER 50mg /d; monitor BP at home. ~  9/12:  BP= 126/84, continue same med, better diet, get wt down... ~  3/13:  BP= 122/80 but he is c/o palpit- rapid, regular, & tapers off to normal; he thinks related to Metoprolol Rx therefore switched to ATENOLOL 100mg /d, monitor BP & palpit. ~  4/14: on Aten100 & BP= 140/90 and he notes better palpit control on this BBlocker; feels well w/o CP, palpit, SOB, edema, etc; better control requires wt reduction!  ~  7/14: on Aten100 & BP= 138/80 and he notes better palpit control on this BBlocker; feels well w/o CP, palpit, SOB, edema, etc  MIXED HYPERLIPIDEMIA (ICD-272.2) >> ~   FLP 7/08 on diet showed TChol 203, TG 141, HDL 28, LDL 154... rec diet + exercise... ~  FLP 8/09 on diet showed TChol 219, TG 127, HDL 26, LDL 163... rec> start PRAV40, but he prefers diet alone. ~  FLP 9/10 on diet showed TChol 205, TG 183, HDL 27, LDL 153 ~  FLP 8/11 on diet showed TChol 190, TG 228, HDL 33, LDL 114 ~  FLP 3/12 on diet showed  TChol 213, TG 192, HDL 34, LDL 150... rec to start PRAV40. ~  FLP 9/12 on Prav40 showed TChol 216, TG 180, HDL 34, LDL 157... No response & LFTs sl elev, therefore STOP Prav Rx. ~  FLP 3/13 on diet showed TChol 211, TG 228, HDL 36, LDL 123... rec better low chol/ low fat diet & get wt down! ~  FLP 4/14 on diet alone showed TChol 202, TG 282, HDL 26, LDL 126... rec to start FENOFIBRATE 160mg /d w/ ROV 3-9mo & recheck. ~  FLP 7/14 on Prav40 showed  TChol 158, TG 342, HDL 29, LDL 71... If TG not improved w/ diet he will need fibrate added...  DIABETES MELLITUS, BORDERLINE (ICD-790.29) >> ~  labs 7/08 showed BS= 116... discussed weight reduction via low carb diet... ~  labs 8/09 showed BS= 103 ~  labs 9/10 showed BS= 120... he needs to do better. ~  labs 8/11 showed BS= 110, A1c= 5.8 ~  labs 3/12 showed BS= 120, A1c= 6.2.Marland Kitchen. he want to continue diet efforts. ~  Labs 9/12 showed BS= 119, A1c= 6.1.Marland KitchenMarland Kitchen He remains on diet alone. ~  Labs 3/13 on diet alone showed BS= 165, A1c= 7.0.Marland KitchenMarland Kitchen rec to start METFORMIN 500mg Qam & better diet (he didn't refill this med when he lost his job). ~  Labs 4/14 on diet alone showed BS= 191, A1c= 8.5.Marland KitchenMarland Kitchen rec to start METFORMIN 500mg Bid + diet... ~  Labs 7/14 on Metform500Bid showed BS= 290, A1c= 11.0.Marland KitchenMarland Kitchen rec to Add GLIMEPIRIDE 4mg Bid + diet etc. ~  Labs 11/14 on Metform500bid+Glimep4 showed BS= 92, A1c= 6.2.Marland KitchenMarland Kitchen rec to continue same but decr the evening Glim to 1/2 tab (4mg Qam&2mg Qpm)  OBESITY (ICD-278.00) - difficult for him to diet/ exercise due to his work schedule... ~  weight 8/09 = 295#, up 3# in the last year... ~  weight 9/10 =  298# ~  weight 8/11 = 314# ~  weight 3.12 = 324# ~  Weight 9/12 = 324# ~  Weight 3/13 = 325# ~  Weight 4/14 = 326# ~  Weight 7/14 = 315# ~  Weight 11/14 = 312#  ABNORMAL LFTs >> ~  Labs 9/12 showed SGOT= 56, SGPT= 82... Pravachol stopped, advised to lose wt... ~  Labs 4/14 showed SGOT= 82, SGPT= 116... Not on statin, we discussed need for diet & wt reduction. ~  Labs 7/14 showed SGOT= 79, SGPT= 104   History reviewed. No pertinent past surgical history.   Outpatient Encounter Prescriptions as of 07/22/2013  Medication Sig  . atenolol (TENORMIN) 100 MG tablet Take 1 tablet (100 mg total) by mouth daily.  Marland Kitchen glimepiride (AMARYL) 4 MG tablet Take 1 tablet (4 mg total) by mouth 2 (two) times daily.  . metFORMIN (GLUCOPHAGE) 500 MG tablet Take 1 tablet (500 mg total) by mouth 2 (two) times daily with a meal.  . pravastatin (PRAVACHOL) 40 MG tablet Take 1 tablet (40 mg total) by mouth daily.  Marland Kitchen glucose blood (BAYER CONTOUR NEXT TEST) test strip Use as instructed    No Known Allergies   Current Medications, Allergies, Past Medical History, Past Surgical History, Family History, and Social History were reviewed in [employer] record.    Review of Systems        See HPI - all other systems neg except as noted...  The patient denies anorexia, fever, weight loss, weight gain, vision loss, decreased hearing, hoarseness, chest pain, syncope, dyspnea on exertion, peripheral edema, prolonged cough, headaches, hemoptysis, abdominal pain, melena, hematochezia, severe  indigestion/heartburn, hematuria, incontinence, muscle weakness, suspicious skin lesions, transient blindness, difficulty walking, depression, unusual weight change, abnormal bleeding, enlarged lymph nodes, and angioedema.     Objective:   Physical Exam    WD, Obese, 32 y/o WM in NAD... GENERAL:  Alert & oriented; pleasant & cooperative... HEENT:  [place]/AT, EOM-wnl, PERRLA, Fundi-benign, EACs-clear, TMs-wnl,  NOSE-clear, THROAT-clear & wnl. NECK:  Supple w/ full ROM; no JVD; normal carotid impulses w/o bruits; no thyromegaly or nodules palpated; no lymphadenopathy. CHEST:  Clear to P & A; without wheezes/ rales/ or rhonchi. HEART:  Regular Rhythm; without murmurs/ rubs/ or gallops. ABDOMEN:  Soft & nontender; normal bowel sounds; no organomegaly or masses detected. EXT: without deformities or arthritic changes; no varicose veins/ venous insuffic/ or edema. NEURO:  CN's intact; motor testing normal; sensory testing normal; gait normal & balance OK. DERM:  No lesions noted; no rash etc...  RADIOLOGY DATA:  Reviewed in the EPIC EMR & discussed w/ the patient...  LABORATORY DATA:  Reviewed in the EPIC EMR & discussed w/ the patient...   Assessment & Plan:    HBP & Palpitations>  Controlled on the Aten100 but he needs to lose the wt to keep from having to incr meds...  HYPERLIPID>  On Prav40 but FLP w/ elev TG & low HDL; he couldn't afford the Feno160 prev; told he will need this added if he can't get the numbers looking better!  DM>  On METFORM500Bid & GLIMEPIRIDE4Bid w/ markedly improved labs (A1c=6.2); continue diet/ exerc/ & decr the Glimto 4Qam &2Qpm...  OBESITY>  Weight is down 10# to 315#; we reviewed wt reduction strategies...  Abn LFTs>  They remain elev off the statin & no worse on Prav40; likely NAFLD- we discussed the need for wt loss...   Patient's Medications  New Prescriptions   GLUCOSE BLOOD (BAYER CONTOUR NEXT TEST) TEST STRIP    Use as instructed  Previous Medications   ATENOLOL (TENORMIN) 100 MG TABLET    Take 1 tablet (100 mg total) by mouth daily.   GLIMEPIRIDE (AMARYL) 4 MG TABLET    Take 1 tablet (4 mg total) in AM, and 1/2 tab (2mg ) in PM...   METFORMIN (GLUCOPHAGE) 500 MG TABLET    Take 1 tablet (500 mg total) by mouth 2 (two) times daily with a meal.   PRAVASTATIN (PRAVACHOL) 40 MG TABLET    Take 1 tablet (40 mg total) by mouth daily.  Modified Medications   No  medications on file  Discontinued Medications   No medications on file

## 2013-07-29 NOTE — Addendum Note (Signed)
Addended by: Marcellus Scott on: 07/29/2013 09:42 AM   Modules accepted: Orders

## 2013-10-16 ENCOUNTER — Ambulatory Visit: Payer: 59 | Admitting: Pulmonary Disease

## 2013-11-19 ENCOUNTER — Other Ambulatory Visit (INDEPENDENT_AMBULATORY_CARE_PROVIDER_SITE_OTHER): Payer: 59

## 2013-11-19 ENCOUNTER — Ambulatory Visit (INDEPENDENT_AMBULATORY_CARE_PROVIDER_SITE_OTHER): Payer: 59 | Admitting: Pulmonary Disease

## 2013-11-19 ENCOUNTER — Encounter: Payer: Self-pay | Admitting: Pulmonary Disease

## 2013-11-19 VITALS — BP 138/82 | HR 81 | Temp 97.4°F | Ht 76.0 in | Wt 312.4 lb

## 2013-11-19 DIAGNOSIS — R7309 Other abnormal glucose: Secondary | ICD-10-CM

## 2013-11-19 DIAGNOSIS — K7689 Other specified diseases of liver: Secondary | ICD-10-CM

## 2013-11-19 DIAGNOSIS — E782 Mixed hyperlipidemia: Secondary | ICD-10-CM

## 2013-11-19 DIAGNOSIS — E119 Type 2 diabetes mellitus without complications: Secondary | ICD-10-CM

## 2013-11-19 DIAGNOSIS — E669 Obesity, unspecified: Secondary | ICD-10-CM

## 2013-11-19 DIAGNOSIS — K760 Fatty (change of) liver, not elsewhere classified: Secondary | ICD-10-CM

## 2013-11-19 DIAGNOSIS — I10 Essential (primary) hypertension: Secondary | ICD-10-CM

## 2013-11-19 LAB — BASIC METABOLIC PANEL
BUN: 10 mg/dL (ref 6–23)
CALCIUM: 9.5 mg/dL (ref 8.4–10.5)
CO2: 26 mEq/L (ref 19–32)
Chloride: 102 mEq/L (ref 96–112)
Creatinine, Ser: 1 mg/dL (ref 0.4–1.5)
GFR: 92.41 mL/min (ref >60.00)
Glucose, Bld: 83 mg/dL (ref 70–99)
POTASSIUM: 4.2 meq/L (ref 3.5–5.1)
SODIUM: 136 meq/L (ref 135–145)

## 2013-11-19 LAB — CBC WITH DIFFERENTIAL/PLATELET
BASOS ABS: 0 10*3/uL (ref 0.0–0.1)
Basophils Relative: 0.3 % (ref 0.0–3.0)
EOS PCT: 3.1 % (ref 0.0–5.0)
Eosinophils Absolute: 0.3 10*3/uL (ref 0.0–0.7)
HCT: 46.4 % (ref 39.0–52.0)
Hemoglobin: 15.3 g/dL (ref 13.0–17.0)
LYMPHS PCT: 28.5 % (ref 12.0–46.0)
Lymphs Abs: 2.4 10*3/uL (ref 0.7–4.0)
MCHC: 32.9 g/dL (ref 30.0–36.0)
MCV: 86.7 fl (ref 78.0–100.0)
Monocytes Absolute: 0.7 10*3/uL (ref 0.1–1.0)
Monocytes Relative: 8.3 % (ref 3.0–12.0)
Neutro Abs: 5 10*3/uL (ref 1.4–7.7)
Neutrophils Relative %: 59.8 % (ref 43.0–77.0)
Platelets: 206 10*3/uL (ref 150.0–400.0)
RBC: 5.36 Mil/uL (ref 4.22–5.81)
RDW: 14.2 % (ref 11.5–14.6)
WBC: 8.4 10*3/uL (ref 4.5–10.5)

## 2013-11-19 LAB — LIPID PANEL
CHOLESTEROL: 167 mg/dL (ref 0–200)
HDL: 31.5 mg/dL — ABNORMAL LOW (ref >39.00)
LDL CALC: 100 mg/dL — AB (ref 0–99)
Total CHOL/HDL Ratio: 5
Triglycerides: 179 mg/dL — ABNORMAL HIGH (ref 0.0–149.0)
VLDL: 35.8 mg/dL (ref 0.0–40.0)

## 2013-11-19 LAB — HEPATIC FUNCTION PANEL
ALK PHOS: 55 U/L (ref 39–117)
ALT: 51 U/L (ref 0–53)
AST: 31 U/L (ref 0–37)
Albumin: 4.4 g/dL (ref 3.5–5.2)
Bilirubin, Direct: 0.1 mg/dL (ref 0.0–0.3)
TOTAL PROTEIN: 8 g/dL (ref 6.0–8.3)
Total Bilirubin: 0.9 mg/dL (ref 0.3–1.2)

## 2013-11-19 LAB — HEMOGLOBIN A1C: HEMOGLOBIN A1C: 6.4 % (ref 4.6–6.5)

## 2013-11-19 LAB — TSH: TSH: 1.75 u[IU]/mL (ref 0.35–5.50)

## 2013-11-19 MED ORDER — PRAVASTATIN SODIUM 40 MG PO TABS: 40.0000 mg | ORAL_TABLET | Freq: Every day | ORAL | Status: DC

## 2013-11-19 MED ORDER — ATENOLOL 100 MG PO TABS: 100.0000 mg | ORAL_TABLET | Freq: Every day | ORAL | Status: DC

## 2013-11-19 NOTE — Progress Notes (Signed)
Subjective:     Patient ID: James [name], male   DOB: [birth year], 33 y.o.   MRN: [id number] HPI Review of Systems Physical Exam Subjective:    Patient ID: James [name], male    DOB: [birth year], 33 y.o.   MRN: [id number]  HPI 33 y/o WM here for a follow up eval and CPX... he has a problem w/ Obesity & elevated lipids and blood sugar...  ~  November 28, 2011:  68mo ROV & James [name] feels well but wt is essent the same & while labs today showed some improvement in FLP on diet alone, his BS/ A1c are worse & he will need to start Metformin;  He also c/o some palpit on the Metoprolol he says describing a rapid regular beating that tapers off slowly to normal; we decided to change his BBlocker to ATENOLOL 100mg /d & follow his symptoms...    HBP, palpit> on ToprolXL50 & BP= 122/80 but he's noted some palpit- rapid regular & tapers off slowly to normal; we decided to change BBlocker to ATENOLOL 100mg /d...    Hyperlipid> off Prav40 for the last 68mo due to elev LFTs; he is on diet alone but hasn't lost any wt; FLP is actually better now w/ [id number] & [id number]; rec to continue diet & exercise!    Borderline DM> on diet alone; wt up 1# to 254#; labs are worse w/ FBS=165, A1c=7.0; we decided to START METFORMIN 500mg  Qam, plus much better diet & exercise!!!    Obesity> weight is 325# up 1# from 68mo ago & this is his max weight; we reviewed diet, exercise, etc; he understands that wt reduction is the key to getting off meds...  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 We reviewed the following medical problems during today's office visit >>     HBP, palpit> on Aten100 & BP= 140/90 and he notes better palpit control on this BBlocker; feels well w/o CP, palpit, SOB, edema, etc; better control requires wt reduction!    Hyperlipid> on diet alone due to hx elev  LFTs; he hasn't lost any wt; FLP shows TChol 202, TG 282, HDL 26, LDL 126; needs better diet, wt reduction & trial FENO160=> ?we called in PRAV40 instead.    Borderline DM> on diet alone (he never refilled the Metform); wt up 2# to 326#; labs are worse w/ FBS=191, A1c=8.5; we decided to re-START METFORMIN 500mg Bid, plus much better diet & exercise!!!    Obesity> weight is 326# as noted & this is his max weight; we reviewed diet, exercise, etc; he understands that wt reduction is the key to getting off meds...    Abnormal LFTs> he likely has NAFLD w/ labs 4/14 showing SGOT=82, SGPT=116; he understands that weight reduction is the only effective therapy... We reviewed prob list, meds, xrays and labs> see below for updates >> he declines the Flu vaccine...   CXR 4/14 showed normal heart size, clear lungs, wnl...  EKG 4/14 showed NSR, rate73, wnl, NAD...  LABS 4/14:  FLP- not at goals on diet alone w/ TG=282 HDL=26;  Chems- ok x BS=191, A1c=8.5, & elev LFTs;  CBC- wnl;  TSH=1.30;  UA=clear...   ~  April 17, 2013:  50mo ROV & James [name] has done a great job on diet & exercise, down 10# to 315# today, dieting &  exercising- keep up the good work...    HBP, palpit> on Aten100 & BP= 138/80 and he notes better palpit control on this BBlocker; feels well w/o CP, palpit, SOB, edema, etc...    Hyperlipid> on Prav40 (?we prev wanted Feno160? too $$?); FLP 7/14 shows TChol 158, TG 342, HDL 29, LDL 71; if not improving he will need fibrate added!    DM> on Metform500Bid; Labs 7/14 show BS=290, A1c= 11.0 which is disappointing; decided to add GLIMEP4mg Bid, continue losing weight!    Obesity> weight is 315# today which is down 10#- great job; we reviewed diet, exercise, wt reduction strategies...    Abnormal LFTs> he likely has NAFLD w/ labs 4/14 showing SGOT=82, SGPT=116; wt down 10# & LFTs show We reviewed prob list, meds, xrays and labs> see below for updates >>   LABS 7/14:  FLP- at goals x TG=342;  Chems- ok x  BS=290, A1c=11.0, sl incr LFTs...  ~  July 22, 2013:  82mo ROV & James [name] has lost a few more lbs down to 312#; he is requesting STD panel in addition to his regular labs;  We reviewed the following medical problems during today's office visit >>     HBP, palpit> on Aten100 & BP= 124/80 and he notes better palpit control on this BBlocker; feels well w/o CP, palpit, SOB, edema, etc...    Hyperlipid> on Prav40 (?we prev wanted Feno160? too $$?); FLP 7/14 shows TChol 158, TG 342, HDL 29, LDL 71; needs to get wt down- if not improving he will need fibrate added!    DM> on Metform500Bid & Glimep4Bid; Labs11/14 show BS=92, A1c= 6.2 which is a fabulous improvement- continue losing weight! rec to decr eve glim to 1/2 tab.    Obesity> weight is 312# today which is down 3# more; we reviewed diet, exercise, wt reduction strategies...    Abnormal LFTs> he likely has NAFLD w/ labs 4/14 showing SGOT=82, SGPT=116; wt down 10# & LFTs show We reviewed prob list, meds, xrays and labs> see below for updates >> he needs Accucheck machine & testing supplies...   LABS 11/14:  Chems- wnl w/ BS=92 A1c=6.2.Marland KitchenMarland Kitchen STD panel done at pt request is all neg...  ~  November 19, 2013:  51mo ROV & James [name] is feeling well, no new complaints or concerns; he notes that BS is up & down "depends on what I eat" and his weight is unchanged at 312# w/ BMI= 38...    HBP, palpit> on Aten100 & BP= 138/82 and he notes better palpit control on this BBlocker; feels well w/o CP, palpit, SOB, edema, etc...    Hyperlipid> on Prav40 (?we prev wanted Feno160? too $$?); FLP 3/15 shows TChol 167, TG 179, HDL 32, LDL 100; needs to get wt down- low chol. Low fat, incr exercise.    DM> on Metform500Bid & Glimep4am & 2pm; Labs 3/15 show BS=83, A1c= 6.4, improved- continue losing weight! rec to STOP the PM Glimep...    Obesity> weight is 312# today which is unchanged; we reviewed diet, exercise, wt reduction strategies...    Abnormal LFTs> he likely has NAFLD w/ labs  3/15 showing SGOT=31, SGPT=51, this is improved- must continue diet, exercise, get wt down! We reviewed prob list, meds, xrays and labs> see below for updates >>   LABS 3/15:  FLP- TChol&LDL are ok but elevTG & lowHDL;  Chems- ok w/ BS=83 A1c=6.4;  CBC- wnl;  TSH=1.75...           Problem List:  PHYSICAL EXAMINATION (ICD-V70.0) - good general health... feels well, no new complaints or concerns...  HYPERTENSION, BORDERLINE (ICD-401.9) - BP's here 140's-160's / 80's-100> surely related to his weight> discussed diet & exercise needed. ~  3/12:  BP= 140/94 & pt rec to start METOPROLOL ER 50mg /d; monitor BP at home. ~  9/12:  BP= 126/84, continue same med, better diet, get wt down... ~  3/13:  BP= 122/80 but he is c/o palpit- rapid, regular, & tapers off to normal; he thinks related to Metoprolol Rx therefore switched to ATENOLOL 100mg /d, monitor BP & palpit. ~  4/14: on Aten100 & BP= 140/90 and he notes better palpit control on this BBlocker; feels well w/o CP, palpit, SOB, edema, etc; better control requires wt reduction!  ~  7/14: on Aten100 & BP= 138/80 and he notes better palpit control on this BBlocker; feels well w/o CP, palpit, SOB, edema, etc ~  3/15: on Aten100 & BP= 138/82 & he remains essentially asymptomatic...  MIXED HYPERLIPIDEMIA (ICD-272.2) >> ~  FLP 7/08 on diet showed TChol 203, TG 141, HDL 28, LDL 154... rec diet + exercise... ~  FLP 8/09 on diet showed TChol 219, TG 127, HDL 26, LDL 163... rec> start PRAV40, but he prefers diet alone. ~  FLP 9/10 on diet showed TChol 205, TG 183, HDL 27, LDL 153 ~  FLP 8/11 on diet showed TChol 190, TG 228, HDL 33, LDL 114 ~  FLP 3/12 on diet showed TChol 213, TG 192, HDL 34, LDL 150... rec to start PRAV40. ~  FLP 9/12 on Prav40 showed TChol 216, TG 180, HDL 34, LDL 157... No response & LFTs sl elev, therefore STOP Prav Rx. ~  FLP 3/13 on diet showed TChol 211, TG 228, HDL 36, LDL 123... rec better low chol/ low fat diet & get wt down! ~   FLP 4/14 on diet alone showed TChol 202, TG 282, HDL 26, LDL 126... rec to start FENOFIBRATE 160mg /d w/ ROV 3-22mo & recheck. ~  FLP 7/14 on Prav40 showed TChol 158, TG 342, HDL 29, LDL 71... If TG not improved w/ diet he will need fibrate added... ~  FLP 3/15 on Prav40 showed TChol 167, TG 179, HDL 32, LDL 100  DIABETES MELLITUS, BORDERLINE (ICD-790.29) >> ~  labs 7/08 showed BS= 116... discussed weight reduction via low carb diet... ~  labs 8/09 showed BS= 103 ~  labs 9/10 showed BS= 120... he needs to do better. ~  labs 8/11 showed BS= 110, A1c= 5.8 ~  labs 3/12 showed BS= 120, A1c= 6.2.Marland Kitchen. he want to continue diet efforts. ~  Labs 9/12 showed BS= 119, A1c= 6.1.Marland KitchenMarland Kitchen He remains on diet alone. ~  Labs 3/13 on diet alone showed BS= 165, A1c= 7.0.Marland KitchenMarland Kitchen rec to start METFORMIN 500mg Qam & better diet (he didn't refill this med when he lost his job). ~  Labs 4/14 on diet alone showed BS= 191, A1c= 8.5.Marland KitchenMarland Kitchen rec to start METFORMIN 500mg Bid + diet... ~  Labs 7/14 on Metform500Bid showed BS= 290, A1c= 11.0.Marland KitchenMarland Kitchen rec to Add GLIMEPIRIDE 4mg Bid + diet etc. ~  Labs 11/14 on Metform500bid+Glimep4Bid showed BS= 92, A1c= 6.2.Marland KitchenMarland Kitchen rec to continue same but decr the evening Glim to 1/2 tab (4mg Qam&2mg Qpm) ~  Labs 3/15 on Metform500Bid+Glimep4am&2pm showed BS=83, A1c=6.4 7 rec to STOP the pm Glimep...  OBESITY (ICD-278.00) - difficult for him to diet/ exercise due to his work schedule... ~  weight 8/09 = 295#, up 3# in the last year... ~  weight 9/10 =  298# ~  weight 8/11 = 314# ~  weight 3.12 = 324# ~  Weight 9/12 = 324# ~  Weight 3/13 = 325# ~  Weight 4/14 = 326# ~  Weight 7/14 = 315# ~  Weight 11/14 = 312# ~  Weight 3/15 = 312#  ABNORMAL LFTs >> ~  Labs 9/12 showed SGOT= 56, SGPT= 82... Pravachol stopped, advised to lose wt... ~  Labs 4/14 showed SGOT= 82, SGPT= 116... Not on statin, we discussed need for diet & wt reduction. ~  Labs 7/14 showed SGOT= 79, SGPT= 104 ~  Labs 3/15 showed SGOT= 31, SGPT=  51   History reviewed. No pertinent past surgical history.   Outpatient Encounter Prescriptions as of 11/19/2013  Medication Sig  . atenolol (TENORMIN) 100 MG tablet Take 1 tablet (100 mg total) by mouth daily.  Marland Kitchen glimepiride (AMARYL) 4 MG tablet Take 1 tablet by mouth in the morning and take 1/2 tablet by mouth in the evening  . glucose blood (BAYER CONTOUR NEXT TEST) test strip Use as instructed  . metFORMIN (GLUCOPHAGE) 500 MG tablet Take 1 tablet (500 mg total) by mouth 2 (two) times daily with a meal.  . pravastatin (PRAVACHOL) 40 MG tablet Take 1 tablet (40 mg total) by mouth daily.    No Known Allergies   Current Medications, Allergies, Past Medical History, Past Surgical History, Family History, and Social History were reviewed in [employer] record.    Review of Systems        See HPI - all other systems neg except as noted...  The patient denies anorexia, fever, weight loss, weight gain, vision loss, decreased hearing, hoarseness, chest pain, syncope, dyspnea on exertion, peripheral edema, prolonged cough, headaches, hemoptysis, abdominal pain, melena, hematochezia, severe indigestion/heartburn, hematuria, incontinence, muscle weakness, suspicious skin lesions, transient blindness, difficulty walking, depression, unusual weight change, abnormal bleeding, enlarged lymph nodes, and angioedema.     Objective:   Physical Exam    WD, Obese, 33 y/o WM in NAD... GENERAL:  Alert & oriented; pleasant & cooperative... HEENT:  [place]/AT, EOM-wnl, PERRLA, Fundi-benign, EACs-clear, TMs-wnl, NOSE-clear, THROAT-clear & wnl. NECK:  Supple w/ full ROM; no JVD; normal carotid impulses w/o bruits; no thyromegaly or nodules palpated; no lymphadenopathy. CHEST:  Clear to P & A; without wheezes/ rales/ or rhonchi. HEART:  Regular Rhythm; without murmurs/ rubs/ or gallops. ABDOMEN:  Soft & nontender; normal bowel sounds; no organomegaly or masses detected. EXT: without  deformities or arthritic changes; no varicose veins/ venous insuffic/ or edema. NEURO:  CN's intact; motor testing normal; sensory testing normal; gait normal & balance OK. DERM:  No lesions noted; no rash etc...  RADIOLOGY DATA:  Reviewed in the EPIC EMR & discussed w/ the patient...  LABORATORY DATA:  Reviewed in the EPIC EMR & discussed w/ the patient...   Assessment & Plan:    HBP & Palpitations>  Controlled on the Aten100 but he needs to lose the wt to keep from having to incr meds...  HYPERLIPID>  On Prav40 but FLP w/ elev TG & low HDL; he couldn't afford the Feno160 prev; told he will need this added if he can't get the numbers looking better!  DM>  On METFORM500Bid & GLIMEPIRIDE4am+2pm w/ markedly improved labs (A1c=6.4); continue diet/ exerc/ & STOP the pm glimep!  OBESITY>  Weight is stable at 312#; we reviewed wt reduction strategies...  Abn LFTs>  They remain elev off the statin & no worse on Prav40; likely NAFLD- we discussed the  need for wt loss.Marland KitchenMarland Kitchen

## 2013-11-19 NOTE — Patient Instructions (Signed)
Today we updated your med list in our EPIC system...    Continue your current medications the same...  Today we did your follow up FASTING blood work...    We will contact you w/ the results when available...   James [name]- YOU KNOW WHAT NEEDS TO BE DONE...    NOW DO IT--- get the weight down!!!  Call for any questions...  Let's plan a follow up visit in 39mo, sooner if needed for problems.Marland Kitchen..Marland Kitchen

## 2013-11-20 ENCOUNTER — Telehealth: Payer: Self-pay | Admitting: Pulmonary Disease

## 2013-11-20 NOTE — Telephone Encounter (Signed)
Please notify patient> FLP shows TChol & LDL look good on Prav40, but TG elevated & HDL too low> Rec to continue Prav40 but needs better low fat diet, exercise & wt reduction!!! Chems look good w/ BS=83 & A1c=6.4> I rec to stop the PM Glimep & continue Metform500Bid & Glimep4mg  Qam only... LFTs are wnl now, CBC & Thyroid are normal NOTE> he could decr meds further if he loses the weight!!! ---  I spoke with patient about results and he verbalized understanding and had no questions

## 2013-11-28 ENCOUNTER — Other Ambulatory Visit: Payer: Self-pay | Admitting: Pulmonary Disease

## 2013-11-28 ENCOUNTER — Telehealth: Payer: Self-pay | Admitting: Pulmonary Disease

## 2013-11-28 MED ORDER — METFORMIN HCL 500 MG PO TABS: 500.0000 mg | ORAL_TABLET | Freq: Two times a day (BID) | ORAL | Status: DC

## 2013-11-28 NOTE — Telephone Encounter (Signed)
Called and spoke with the pt  He is asking for refill on metformin  Rx was refilled  He is aware that SN is retiring from [facility] 12/18/13  He would like referral to see Dr. Karleen HampshireSpencer Copland  [facility] at [phone] was placed on hold for 7 minutes, without speaking to anyone! WCB again 11/29/13

## 2013-11-29 MED ORDER — GLIMEPIRIDE 4 MG PO TABS: ORAL_TABLET | ORAL | Status: DC

## 2013-11-29 NOTE — Telephone Encounter (Signed)
[place] [facility] [place]. Dr. Patsy Lageropland is not accepting new pt's. appt scheduled with Dr. Patrice ParadiseGuiterrez 12/31/13 at 3:45.  I called and made pt aware. Nothing further needed

## 2013-12-31 ENCOUNTER — Ambulatory Visit: Payer: 59 | Admitting: Family Medicine

## 2014-05-03 ENCOUNTER — Other Ambulatory Visit: Payer: Self-pay | Admitting: Pulmonary Disease

## 2014-05-22 ENCOUNTER — Ambulatory Visit: Payer: 59 | Admitting: Pulmonary Disease

## 2014-06-02 ENCOUNTER — Ambulatory Visit: Payer: 59 | Admitting: Family Medicine

## 2014-06-09 ENCOUNTER — Encounter: Payer: Self-pay | Admitting: Family Medicine

## 2014-06-09 ENCOUNTER — Ambulatory Visit (INDEPENDENT_AMBULATORY_CARE_PROVIDER_SITE_OTHER): Payer: 59 | Admitting: Family Medicine

## 2014-06-09 VITALS — BP 120/84 | HR 68 | Temp 98.2°F | Ht 76.0 in | Wt 312.4 lb

## 2014-06-09 DIAGNOSIS — E119 Type 2 diabetes mellitus without complications: Secondary | ICD-10-CM

## 2014-06-09 DIAGNOSIS — K760 Fatty (change of) liver, not elsewhere classified: Secondary | ICD-10-CM

## 2014-06-09 DIAGNOSIS — E1165 Type 2 diabetes mellitus with hyperglycemia: Secondary | ICD-10-CM

## 2014-06-09 DIAGNOSIS — Z23 Encounter for immunization: Secondary | ICD-10-CM

## 2014-06-09 DIAGNOSIS — I10 Essential (primary) hypertension: Secondary | ICD-10-CM

## 2014-06-09 DIAGNOSIS — E669 Obesity, unspecified: Secondary | ICD-10-CM

## 2014-06-09 DIAGNOSIS — E1140 Type 2 diabetes mellitus with diabetic neuropathy, unspecified: Secondary | ICD-10-CM | POA: Insufficient documentation

## 2014-06-09 DIAGNOSIS — E782 Mixed hyperlipidemia: Secondary | ICD-10-CM

## 2014-06-09 DIAGNOSIS — K7689 Other specified diseases of liver: Secondary | ICD-10-CM

## 2014-06-09 LAB — COMPREHENSIVE METABOLIC PANEL
ALT: 45 U/L (ref 0–53)
AST: 38 U/L — ABNORMAL HIGH (ref 0–37)
Albumin: 4.4 g/dL (ref 3.5–5.2)
Alkaline Phosphatase: 56 U/L (ref 39–117)
BUN: 9 mg/dL (ref 6–23)
CALCIUM: 9.3 mg/dL (ref 8.4–10.5)
CHLORIDE: 106 meq/L (ref 96–112)
CO2: 27 meq/L (ref 19–32)
CREATININE: 1 mg/dL (ref 0.4–1.5)
GFR: 90 mL/min (ref >60.00)
Glucose, Bld: 138 mg/dL — ABNORMAL HIGH (ref 70–99)
Potassium: 4.3 mEq/L (ref 3.5–5.1)
Sodium: 140 mEq/L (ref 135–145)
Total Bilirubin: 0.8 mg/dL (ref 0.2–1.2)
Total Protein: 8 g/dL (ref 6.0–8.3)

## 2014-06-09 LAB — LIPID PANEL
CHOL/HDL RATIO: 6
Cholesterol: 154 mg/dL (ref 0–200)
HDL: 26.2 mg/dL — ABNORMAL LOW (ref >39.00)
LDL Cholesterol: 88 mg/dL (ref 0–99)
NonHDL: 127.8
Triglycerides: 197 mg/dL — ABNORMAL HIGH (ref 0.0–149.0)
VLDL: 39.4 mg/dL (ref 0.0–40.0)

## 2014-06-09 LAB — MICROALBUMIN / CREATININE URINE RATIO
CREATININE, U: 325.7 mg/dL
Microalb Creat Ratio: 0.7 mg/g (ref 0.0–30.0)
Microalb, Ur: 2.2 mg/dL — ABNORMAL HIGH (ref 0.0–1.9)

## 2014-06-09 LAB — HEMOGLOBIN A1C: Hgb A1c MFr Bld: 6.4 % (ref 4.6–6.5)

## 2014-06-09 NOTE — Assessment & Plan Note (Signed)
Chronic, stable. Continue pravastatin. 

## 2014-06-09 NOTE — Progress Notes (Signed)
BP 120/84  Pulse 68  Temp(Src) 98.2 F (36.8 C) (Oral)  Ht  (1.93 m)  Wt 312 lb 6.4 oz (141.704 kg)  BMI 38.04 kg/m2   CC: new pt to establish  Subjective:    Patient ID: James [name], male    DOB: [birth year], 33 y.o.   MRN: [id number]  HPI: James [name] is a 33 y.o. male presenting on 06/09/2014 for Establish Care   Prior saw Dr. Kriste Basque.  HTN - atenolol  once daily. Toprol XL caused tachycardias. No HA, vision changes, CP/tightness, SOB, leg swelling.   DM - well controlled on metformin  bid and amaryl  in am. Regularly does not check sugars.  Denies hypoglycemic symptoms.  Denies paresthesias. Last diabetic eye exam 02/2013.  Pneumovax: today.  Prevnar: not done.  HLD - compliant with pravastatin  daily. Takes in am. No myalgias  Obesity - losing weight. Cardio daily, some weight lifting.  Wt Readings from Last 3 Encounters:  06/09/14 312 lb 6.4 oz (141.704 kg)  11/19/13 312 lb 6.4 oz (141.704 kg)  07/22/13 311 lb 12.8 oz (141.432 kg)   Body mass index is 38.04 kg/(m^2).  Preventative: Tdap today Pneumovax today Flu shot at work  Lives alone Occupation: works at [employer]: AA Activity: active weekly - 30 min/day on treadmill. Diet: good water, fruits/vegetables daily, avoids fast foods  Relevant past medical, surgical, family and social history reviewed and updated as indicated.  Allergies and medications reviewed and updated. Current Outpatient Prescriptions on File Prior to Visit  Medication Sig  . atenolol (TENORMIN) 100 MG tablet Take 1 tablet (100 mg total) by mouth daily.  Marland Kitchen glucose blood (BAYER CONTOUR NEXT TEST) test strip Use as instructed  . metFORMIN (GLUCOPHAGE) 500 MG tablet TAKE 1 TABLET (500 MG TOTAL) BY MOUTH 2 (TWO) TIMES DAILY WITH A MEAL.  . [DISCONTINUED] metoprolol (TOPROL-XL) 50 MG 24 hr tablet Take 50 mg by mouth daily.     No current facility-administered medications on file prior to visit.     Review of Systems  Constitutional: Negative for fever, chills, activity change, appetite change, fatigue and unexpected weight change.  HENT: Negative for hearing loss.   Eyes: Negative for visual disturbance.  Respiratory: Negative for cough, chest tightness, shortness of breath and wheezing.   Cardiovascular: Negative for chest pain, palpitations and leg swelling.  Gastrointestinal: Negative for nausea, vomiting, abdominal pain, diarrhea, constipation, blood in stool and abdominal distention.  Genitourinary: Negative for hematuria and difficulty urinating.  Musculoskeletal: Negative for arthralgias, myalgias and neck pain.  Skin: Negative for rash.  Neurological: Negative for dizziness, seizures, syncope and headaches.  Hematological: Negative for adenopathy. Does not bruise/bleed easily.  Psychiatric/Behavioral: Negative for dysphoric mood. The patient is not nervous/anxious.    Per HPI unless specifically indicated above    Objective:    BP 120/84  Pulse 68  Temp(Src) 98.2 F (36.8 C) (Oral)  Ht  (1.93 m)  Wt 312 lb 6.4 oz (141.704 kg)  BMI 38.04 kg/m2  Physical Exam  Nursing note and vitals reviewed. Constitutional: He is oriented to person, place, and time. He appears well-developed and well-nourished. No distress.  obesity  HENT:  Head: Normocephalic and atraumatic.  Right Ear: Hearing, tympanic membrane and ear canal normal.  Left Ear: Hearing, tympanic membrane and ear canal normal.  Mouth/Throat: Uvula is midline, oropharynx is clear and moist and mucous membranes are normal. No oropharyngeal exudate, posterior oropharyngeal edema or posterior oropharyngeal erythema.  Eyes: Conjunctivae and EOM are normal. Pupils are equal, round, and reactive to light. No scleral icterus.  Neck: Normal range of motion. Neck supple. No thyromegaly present.  Cardiovascular: Normal rate, regular rhythm, normal heart sounds and intact distal pulses.   No murmur heard. Pulses:       Radial pulses are 2+ on the right side, and 2+ on the left side.  Pulmonary/Chest: Effort normal and breath sounds normal. No respiratory distress. He has no wheezes. He has no rales.  Musculoskeletal: Normal range of motion. He exhibits no edema.  Diabetic foot exam: Normal inspection No skin breakdown No calluses  Normal DP pulses Normal sensation to light touch and monofilament Nails normal  Lymphadenopathy:    He has no cervical adenopathy.  Neurological: He is alert and oriented to person, place, and time.  CN grossly intact, station and gait intact  Skin: Skin is warm and dry. No rash noted.  Psychiatric: He has a normal mood and affect. His behavior is normal. Judgment and thought content normal.   Results for orders placed in visit on 11/19/13  LIPID PANEL      Result Value Ref Range   Cholesterol 167  0 - 200 mg/dL   Triglycerides 161.0 (*) 0.0 - 149.0 mg/dL   HDL 96.04 (*) >54.09 mg/dL   VLDL 81.1  0.0 - 91.4 mg/dL   LDL Cholesterol 782 (*) 0 - 99 mg/dL   Total CHOL/HDL Ratio 5    BASIC METABOLIC PANEL      Result Value Ref Range   Sodium 136  135 - 145 mEq/L   Potassium 4.2  3.5 - 5.1 mEq/L   Chloride 102  96 - 112 mEq/L   CO2 26  19 - 32 mEq/L   Glucose, Bld 83  70 - 99 mg/dL   BUN 10  6 - 23 mg/dL   Creatinine, Ser 1.0  0.4 - 1.5 mg/dL   Calcium 9.5  8.4 - 95.6 mg/dL   GFR 21.30  >86.57 mL/min  HEPATIC FUNCTION PANEL      Result Value Ref Range   Total Bilirubin 0.9  0.3 - 1.2 mg/dL   Bilirubin, Direct 0.1  0.0 - 0.3 mg/dL   Alkaline Phosphatase 55  39 - 117 U/L   AST 31  0 - 37 U/L   ALT 51  0 - 53 U/L   Total Protein 8.0  6.0 - 8.3 g/dL   Albumin 4.4  3.5 - 5.2 g/dL  CBC WITH DIFFERENTIAL      Result Value Ref Range   WBC 8.4  4.5 - 10.5 K/uL   RBC 5.36  4.22 - 5.81 Mil/uL   Hemoglobin 15.3  13.0 - 17.0 g/dL   HCT 84.6  96.2 - 95.2 %   MCV 86.7  78.0 - 100.0 fl   MCHC 32.9  30.0 - 36.0 g/dL   RDW 84.1  32.4 - 40.1 %   Platelets 206.0  150.0 -  400.0 K/uL   Neutrophils Relative % 59.8  43.0 - 77.0 %   Lymphocytes Relative 28.5  12.0 - 46.0 %   Monocytes Relative 8.3  3.0 - 12.0 %   Eosinophils Relative 3.1  0.0 - 5.0 %   Basophils Relative 0.3  0.0 - 3.0 %   Neutro Abs 5.0  1.4 - 7.7 K/uL   Lymphs Abs 2.4  0.7 - 4.0 K/uL   Monocytes Absolute 0.7  0.1 - 1.0 K/uL   Eosinophils Absolute 0.3  0.0 -  0.7 K/uL   Basophils Absolute 0.0  0.0 - 0.1 K/uL  TSH      Result Value Ref Range   TSH 1.75  0.35 - 5.50 uIU/mL  HEMOGLOBIN A1C      Result Value Ref Range   Hemoglobin A1C 6.4  4.6 - 6.5 %      Assessment & Plan:   Problem List Items Addressed This Visit   Obesity     Reviewed healthy diet and lifestyle changes for weight loss.    Relevant Medications      glimepiride (AMARYL) 4 MG tablet   NAFLD (nonalcoholic fatty liver disease)     ?of this - check LFTs today.    Mixed hyperlipidemia     Chronic, stable. Continue pravastatin.    Relevant Medications      pravastatin (PRAVACHOL) 40 MG tablet   HTN (hypertension) - Primary     Chronic, stable. Continue atenolol  daily.    Relevant Medications      pravastatin (PRAVACHOL) 40 MG tablet   Diabetes type 2, controlled     Chronic, stable. Continue regimen. Check A1c today. Foot exam today. Suggested he schedule eye exam as due.    Relevant Medications      pravastatin (PRAVACHOL) 40 MG tablet      glimepiride (AMARYL) 4 MG tablet   Other Relevant Orders      Lipid panel      Hemoglobin A1c      Comprehensive metabolic panel      Microalbumin / creatinine urine ratio       Follow up plan: Return in about 6 months (around 12/08/2014), or as needed, for physical.

## 2014-06-09 NOTE — Assessment & Plan Note (Signed)
Reviewed healthy diet and lifestyle changes for weight loss.

## 2014-06-09 NOTE — Assessment & Plan Note (Signed)
Chronic, stable. Continue regimen. Check A1c today. Foot exam today. Suggested he schedule eye exam as due.

## 2014-06-09 NOTE — Assessment & Plan Note (Signed)
Chronic, stable. Continue atenolol  daily.

## 2014-06-09 NOTE — Assessment & Plan Note (Signed)
?  of this - check LFTs today.

## 2014-06-09 NOTE — Addendum Note (Signed)
Addended by: Desmond Dike on: 06/09/2014 10:21 AM   Modules accepted: Orders

## 2014-06-09 NOTE — Patient Instructions (Signed)
Tdap today. Pneumovax today. Flu shot at work. Blood work today. Change pravastatin to night time. Return to see me in 6-8 months for physical, sooner as needed. Good to meet you today, call us with questions.

## 2014-06-09 NOTE — Addendum Note (Signed)
Addended by: [name] on: 06/09/2014 10:36 AM   Modules accepted: Orders

## 2014-06-11 ENCOUNTER — Encounter: Payer: Self-pay | Admitting: Family Medicine

## 2014-07-04 ENCOUNTER — Other Ambulatory Visit: Payer: Self-pay

## 2014-07-04 ENCOUNTER — Encounter: Payer: Self-pay | Admitting: Family Medicine

## 2014-08-22 ENCOUNTER — Other Ambulatory Visit: Payer: Self-pay | Admitting: Pulmonary Disease

## 2014-08-26 ENCOUNTER — Other Ambulatory Visit: Payer: Self-pay | Admitting: Family Medicine

## 2014-09-01 ENCOUNTER — Other Ambulatory Visit: Payer: Self-pay | Admitting: Family Medicine

## 2014-09-01 NOTE — Telephone Encounter (Signed)
Received refill request electronically from pharmacy. Refill request directions do not match medication list. Please advise.

## 2014-09-19 DIAGNOSIS — Z8701 Personal history of pneumonia (recurrent): Secondary | ICD-10-CM

## 2014-09-19 HISTORY — DX: Personal history of pneumonia (recurrent): Z87.01

## 2014-10-16 ENCOUNTER — Encounter: Payer: Self-pay | Admitting: Family Medicine

## 2014-10-16 ENCOUNTER — Ambulatory Visit (INDEPENDENT_AMBULATORY_CARE_PROVIDER_SITE_OTHER)
Admission: RE | Admit: 2014-10-16 | Discharge: 2014-10-16 | Disposition: A | Discharge status: Home / Self Care | Payer: 59 | Source: Ambulatory Visit | Attending: Family Medicine | Admitting: Family Medicine

## 2014-10-16 ENCOUNTER — Ambulatory Visit (INDEPENDENT_AMBULATORY_CARE_PROVIDER_SITE_OTHER): Payer: 59 | Admitting: Family Medicine

## 2014-10-16 VITALS — BP 134/92 | HR 100 | Temp 98.8°F | Wt 309.2 lb

## 2014-10-16 DIAGNOSIS — R05 Cough: Secondary | ICD-10-CM

## 2014-10-16 DIAGNOSIS — R059 Cough, unspecified: Secondary | ICD-10-CM

## 2014-10-16 DIAGNOSIS — J189 Pneumonia, unspecified organism: Secondary | ICD-10-CM

## 2014-10-16 DIAGNOSIS — J181 Lobar pneumonia, unspecified organism: Principal | ICD-10-CM

## 2014-10-16 MED ORDER — LEVOFLOXACIN 500 MG PO TABS: 500.0000 mg | ORAL_TABLET | Freq: Every day | ORAL | Status: DC

## 2014-10-16 NOTE — Progress Notes (Addendum)
BP 134/92 mmHg  Pulse 100  Temp(Src) 98.8 F (37.1 C) (Oral)  Wt 309 lb 4 oz (140.275 kg)  SpO2 94%   CC: f/u bronchitis  Subjective:    Patient ID: James [name], male    DOB: 1981-01-16, 34 y.o.   MRN: [id number]  HPI: James [name] is a 34 y.o. male presenting on 10/16/2014 for Bronchitis   3 wk h/o cough progressively worsening. [facility], did not improve, [facility] with albuterol inhaler (no effect) and hycodan cough syrup (mildly helpful). Cough has been worsening. Persistent productive cough. Taking mucinex as well. Worse cough at night time with coughing fits. Headache and chest pain from coughing. + coughing fits with post tussive vomiting x 1 yesterday  No fevers/chills, wheezing or dyspnea, ear or tooth pain. No head congestion, PNdrainage. No smokers at home. H/o childhood asthma - dust mite related. No sick contacts at home.   Relevant past medical, surgical, family and social history reviewed and updated as indicated. Interim medical history since our last visit reviewed. Allergies and medications reviewed and updated. Current Outpatient Prescriptions on File Prior to Visit  Medication Sig  . atenolol (TENORMIN) 100 MG tablet Take 1 tablet (100 mg total) by mouth daily.  Marland Kitchen. glimepiride (AMARYL) 4 MG tablet Take 1 tablet (4 mg total) by mouth daily with breakfast.  . glucose blood (BAYER CONTOUR NEXT TEST) test strip Use as instructed  . metFORMIN (GLUCOPHAGE) 500 MG tablet TAKE 1 TABLET (500 MG TOTAL) BY MOUTH 2 (TWO) TIMES DAILY WITH A MEAL.  . pravastatin (PRAVACHOL) 40 MG tablet Take 40 mg by mouth at bedtime.  . [DISCONTINUED] metoprolol (TOPROL-XL) 50 MG 24 hr tablet Take 50 mg by mouth daily.     No current facility-administered medications on file prior to visit.   Past Medical History  Diagnosis Date  . HTN (hypertension)   . Mixed hyperlipidemia   . Diabetes type 2,  controlled   . Obesity   . History of asthma childhood    dust mites  . NAFLD (nonalcoholic fatty liver disease)     Review of Systems Per HPI unless specifically indicated above     Objective:    BP 134/92 mmHg  Pulse 100  Temp(Src) 98.8 F (37.1 C) (Oral)  Wt 309 lb 4 oz (140.275 kg)  SpO2 94%  Wt Readings from Last 3 Encounters:  10/16/14 309 lb 4 oz (140.275 kg)  06/09/14 312 lb 6.4 oz (141.704 kg)  11/19/13 312 lb 6.4 oz (141.704 kg)    Physical Exam  Constitutional: He appears well-developed and well-nourished. No distress.  HENT:  Head: Normocephalic and atraumatic.  Right Ear: Hearing, tympanic membrane, external ear and ear canal normal.  Left Ear: Hearing, tympanic membrane, external ear and ear canal normal.  Nose: Nose normal. No mucosal edema or rhinorrhea. Right sinus exhibits no maxillary sinus tenderness and no frontal sinus tenderness. Left sinus exhibits no maxillary sinus tenderness and no frontal sinus tenderness.  Mouth/Throat: Uvula is midline, oropharynx is clear and moist and mucous membranes are normal. No oropharyngeal exudate, posterior oropharyngeal edema, posterior oropharyngeal erythema or tonsillar abscesses.  Eyes: Conjunctivae and EOM are normal. Pupils are equal, round, and reactive to light. No scleral icterus.  Neck: Normal range of motion. Neck supple.  Cardiovascular: Normal rate, regular rhythm, normal heart sounds and intact distal pulses.   No murmur heard. Pulmonary/Chest: Effort normal. No respiratory distress.  He has decreased breath sounds. He has no wheezes. He has rales (bilateral mid lung fields).  rattling cough present  Lymphadenopathy:    He has no cervical adenopathy.  Skin: Skin is warm and dry. No rash noted.  Nursing note and vitals reviewed.      Assessment & Plan:   Problem List Items Addressed This Visit    Left lower lobe pneumonia - Primary    Persistent cough that failed macrolide with abnormal lung  exam. Check CXR today - clear on my read. Some central airway thickening. Regardless with broaden coverage to levaquin today Update if not improved with this. Pt agrees with plan. To update me if persistent sxs with dyspnea or wheezing on Monday for consideration of steroid course - discussed this would raise sugars though. ADDENDUM ==> LLL PNA per radiologist read. Pt notified. No changes in treatment indicated.      Relevant Medications   HYDROcodone-homatropine (HYCODAN) 5-1.5 MG/5ML syrup   albuterol (PROVENTIL HFA;VENTOLIN HFA) 108 (90 BASE) MCG/ACT inhaler   levofloxacin (LEVAQUIN) tablet       Follow up plan: Return if symptoms worsen or fail to improve.

## 2014-10-16 NOTE — Progress Notes (Signed)
Pre visit review using our clinic review tool, if applicable. No additional management support is needed unless otherwise documented below in the visit note. 

## 2014-10-16 NOTE — Patient Instructions (Addendum)
Xray ok.  I'd like to broaden antibiotic to levaquin 500mg  daily for 7 days. Continue hycodan cough syrup and albuterol if you feel short winded. Let us know if fever >101 or worsening instead of daily improvement

## 2014-10-16 NOTE — Assessment & Plan Note (Addendum)
Persistent cough that failed macrolide with abnormal lung exam. Check CXR today - clear on my read. Some central airway thickening. Regardless with broaden coverage to levaquin today Update if not improved with this. Pt agrees with plan. To update me if persistent sxs with dyspnea or wheezing on Monday for consideration of steroid course - discussed this would raise sugars though. ADDENDUM ==> LLL PNA per radiologist read. Pt notified. No changes in treatment indicated.

## 2014-11-13 ENCOUNTER — Other Ambulatory Visit: Payer: Self-pay | Admitting: Pulmonary Disease

## 2014-12-09 ENCOUNTER — Encounter: Payer: Self-pay | Admitting: Family Medicine

## 2014-12-09 ENCOUNTER — Ambulatory Visit (INDEPENDENT_AMBULATORY_CARE_PROVIDER_SITE_OTHER): Payer: 59 | Admitting: Family Medicine

## 2014-12-09 VITALS — BP 132/78 | HR 80 | Temp 98.2°F | Ht 76.5 in | Wt 317.4 lb

## 2014-12-09 DIAGNOSIS — E669 Obesity, unspecified: Secondary | ICD-10-CM

## 2014-12-09 DIAGNOSIS — Z0000 Encounter for general adult medical examination without abnormal findings: Secondary | ICD-10-CM

## 2014-12-09 DIAGNOSIS — I10 Essential (primary) hypertension: Secondary | ICD-10-CM | POA: Diagnosis not present

## 2014-12-09 DIAGNOSIS — K760 Fatty (change of) liver, not elsewhere classified: Secondary | ICD-10-CM | POA: Diagnosis not present

## 2014-12-09 DIAGNOSIS — E782 Mixed hyperlipidemia: Secondary | ICD-10-CM | POA: Diagnosis not present

## 2014-12-09 DIAGNOSIS — E119 Type 2 diabetes mellitus without complications: Secondary | ICD-10-CM | POA: Diagnosis not present

## 2014-12-09 LAB — COMPREHENSIVE METABOLIC PANEL
ALBUMIN: 4.6 g/dL (ref 3.5–5.2)
ALT: 86 U/L — AB (ref 0–53)
AST: 52 U/L — ABNORMAL HIGH (ref 0–37)
Alkaline Phosphatase: 67 U/L (ref 39–117)
BUN: 12 mg/dL (ref 6–23)
CALCIUM: 9.7 mg/dL (ref 8.4–10.5)
CHLORIDE: 101 meq/L (ref 96–112)
CO2: 26 meq/L (ref 19–32)
CREATININE: 1.02 mg/dL (ref 0.40–1.50)
GFR: 88.71 mL/min (ref >60.00)
Glucose, Bld: 187 mg/dL — ABNORMAL HIGH (ref 70–99)
POTASSIUM: 4.2 meq/L (ref 3.5–5.1)
Sodium: 134 mEq/L — ABNORMAL LOW (ref 135–145)
Total Bilirubin: 0.8 mg/dL (ref 0.2–1.2)
Total Protein: 8 g/dL (ref 6.0–8.3)

## 2014-12-09 LAB — HEMOGLOBIN A1C: Hgb A1c MFr Bld: 7.6 % — ABNORMAL HIGH (ref 4.6–6.5)

## 2014-12-09 LAB — LIPID PANEL
CHOL/HDL RATIO: 6
Cholesterol: 173 mg/dL (ref 0–200)
HDL: 29.2 mg/dL — ABNORMAL LOW (ref >39.00)
NONHDL: 143.8
TRIGLYCERIDES: 236 mg/dL — AB (ref 0.0–149.0)
VLDL: 47.2 mg/dL — AB (ref 0.0–40.0)

## 2014-12-09 LAB — IBC PANEL
Iron: 93 ug/dL (ref 42–165)
SATURATION RATIOS: 21.9 % (ref 20.0–50.0)
Transferrin: 303 mg/dL (ref 212.0–360.0)

## 2014-12-09 LAB — MICROALBUMIN / CREATININE URINE RATIO
Creatinine,U: 478.9 mg/dL
MICROALB/CREAT RATIO: 0.6 mg/g (ref 0.0–30.0)
Microalb, Ur: 3.1 mg/dL — ABNORMAL HIGH (ref 0.0–1.9)

## 2014-12-09 LAB — TSH: TSH: 1.5 u[IU]/mL (ref 0.35–4.50)

## 2014-12-09 LAB — LDL CHOLESTEROL, DIRECT: Direct LDL: 108 mg/dL

## 2014-12-09 MED ORDER — GLIMEPIRIDE 4 MG PO TABS: 4.0000 mg | ORAL_TABLET | Freq: Every day | ORAL | Status: DC

## 2014-12-09 NOTE — Progress Notes (Signed)
BP 132/78 mmHg  Pulse 80  Temp(Src) 98.2 F (36.8 C) (Oral)  Ht 6' 4.5" (1.943 m)  Wt 317 lb 6.4 oz (143.972 kg)  BMI 38.14 kg/m2   CC: CPE  Subjective:    Patient ID: James [name], male    DOB: [birth year], 34 y.o.   MRN: [id number]  HPI: James [name] is a 35 y.o. male presenting on 12/09/2014 for Annual Exam   DM - compliant with amaryl and metformin. Dx around [address] HTN - on atenolol HLD - on pravastatin NAFLD - per chart.  Sexually active, 2 partners in past year. HIV testing done last year. Declines concerns for STDs.  Preventative: Flu shot 06/2014 Tdap 04/2014 Pneumovax 05/2014 Seat belt use discussed Sunscreen use discussed. No changing moles on skin.   Lives alone Occupation: works at [employer] old [employer] Edu: [employer]  Activity: 30 min/day 3x/wk on treadmill  Diet: good water, fruits/vegetables daily, avoids fast foods  Relevant past medical, surgical, family and social history reviewed and updated as indicated. Interim medical history since our last visit reviewed. Allergies and medications reviewed and updated. Current Outpatient Prescriptions on File Prior to Visit  Medication Sig  . atenolol (TENORMIN) 100 MG tablet Take 1 tablet (100 mg total) by mouth daily.  Marland Kitchen glucose blood (BAYER CONTOUR NEXT TEST) test strip Use as instructed  . metFORMIN (GLUCOPHAGE) 500 MG tablet TAKE 1 TABLET (500 MG TOTAL) BY MOUTH 2 (TWO) TIMES DAILY WITH A MEAL.  . pravastatin (PRAVACHOL) 40 MG tablet Take 40 mg by mouth at bedtime.  . [DISCONTINUED] metoprolol (TOPROL-XL) 50 MG 24 hr tablet Take 50 mg by mouth daily.     No current facility-administered medications on file prior to visit.    Review of Systems  Constitutional: Negative for fever, chills, activity change, appetite change, fatigue and unexpected weight change.  HENT: Negative for hearing loss.   Eyes: Negative for visual disturbance.  Respiratory: Negative for cough, chest tightness, shortness of  breath and wheezing.   Cardiovascular: Negative for chest pain, palpitations and leg swelling.  Gastrointestinal: Negative for nausea, vomiting, abdominal pain, diarrhea, constipation, blood in stool and abdominal distention.  Genitourinary: Negative for hematuria and difficulty urinating.  Musculoskeletal: Negative for myalgias, arthralgias and neck pain.  Skin: Negative for rash.  Neurological: Negative for dizziness, seizures, syncope and headaches.  Hematological: Negative for adenopathy. Does not bruise/bleed easily.  Psychiatric/Behavioral: Negative for dysphoric mood. The patient is not nervous/anxious.    Per HPI unless specifically indicated above     Objective:    BP 132/78 mmHg  Pulse 80  Temp(Src) 98.2 F (36.8 C) (Oral)  Ht 6' 4.5" (1.943 m)  Wt 317 lb 6.4 oz (143.972 kg)  BMI 38.14 kg/m2  Wt Readings from Last 3 Encounters:  12/09/14 317 lb 6.4 oz (143.972 kg)  10/16/14 309 lb 4 oz (140.275 kg)  06/09/14 312 lb 6.4 oz (141.704 kg)    Physical Exam  Constitutional: He is oriented to person, place, and time. He appears well-developed and well-nourished. No distress.  HENT:  Head: Normocephalic and atraumatic.  Right Ear: Hearing, tympanic membrane, external ear and ear canal normal.  Left Ear: Hearing, tympanic membrane, external ear and ear canal normal.  Nose: Nose normal.  Mouth/Throat: Uvula is midline, oropharynx is clear and moist and mucous membranes are normal. No oropharyngeal exudate, posterior oropharyngeal edema or posterior oropharyngeal erythema.  Eyes: Conjunctivae and EOM are normal. Pupils are equal, round, and reactive to light. No scleral icterus.  Neck: Normal range of motion. Neck supple. No thyromegaly present.  Cardiovascular: Normal rate, regular rhythm, normal heart sounds and intact distal pulses.   No murmur heard. Pulses:      Radial pulses are 2+ on the right side, and 2+ on the left side.  Pulmonary/Chest: Effort normal and breath  sounds normal. No respiratory distress. He has no wheezes. He has no rales.  Abdominal: Soft. Bowel sounds are normal. He exhibits no distension and no mass. There is no tenderness. There is no rebound and no guarding.  Musculoskeletal: Normal range of motion. He exhibits no edema.  Lymphadenopathy:    He has no cervical adenopathy.  Neurological: He is alert and oriented to person, place, and time.  CN grossly intact, station and gait intact  Skin: Skin is warm and dry. No rash noted.  Psychiatric: He has a normal mood and affect. His behavior is normal. Judgment and thought content normal.  Nursing note and vitals reviewed.  Results for orders placed or performed in visit on 06/09/14  Lipid panel  Result Value Ref Range   Cholesterol 154 0 - 200 mg/dL   Triglycerides 161.0 (H) 0.0 - 149.0 mg/dL   HDL 96.04 (L) >54.09 mg/dL   VLDL 81.1 0.0 - 91.4 mg/dL   LDL Cholesterol 88 0 - 99 mg/dL   Total CHOL/HDL Ratio 6    NonHDL 127.80   Hemoglobin A1c  Result Value Ref Range   Hgb A1c MFr Bld 6.4 4.6 - 6.5 %  Comprehensive metabolic panel  Result Value Ref Range   Sodium 140 135 - 145 mEq/L   Potassium 4.3 3.5 - 5.1 mEq/L   Chloride 106 96 - 112 mEq/L   CO2 27 19 - 32 mEq/L   Glucose, Bld 138 (H) 70 - 99 mg/dL   BUN 9 6 - 23 mg/dL   Creatinine, Ser 1.0 0.4 - 1.5 mg/dL   Total Bilirubin 0.8 0.2 - 1.2 mg/dL   Alkaline Phosphatase 56 39 - 117 U/L   AST 38 (H) 0 - 37 U/L   ALT 45 0 - 53 U/L   Total Protein 8.0 6.0 - 8.3 g/dL   Albumin 4.4 3.5 - 5.2 g/dL   Calcium 9.3 8.4 - 78.2 mg/dL   GFR 95.62 >13.08 mL/min  Microalbumin / creatinine urine ratio  Result Value Ref Range   Microalb, Ur 2.2 (H) 0.0 - 1.9 mg/dL   Creatinine,U 657.8 mg/dL   Microalb Creat Ratio 0.7 0.0 - 30.0 mg/g      Assessment & Plan:   Problem List Items Addressed This Visit    Obesity    Discussed healthy diet and lifestyle changes to affect sustainable weight loss. Body mass index is 38.14 kg/(m^2).        Relevant Medications   glimepiride (AMARYL) tablet   NAFLD (nonalcoholic fatty liver disease)    Reviewed LFTs in past - transaminitis goes back to 2012. Discussed relation of obesity with NAFLD. Check iron panel today.  Consider acute hep panel in the future. Has not had abd [place] in past.      Relevant Orders   Comprehensive metabolic panel   IBC panel   Mixed hyperlipidemia    Chronic, stable. Continue pravastatin. Check FLP today.      Relevant Orders   Lipid panel   HTN (hypertension)    Chronic, stable. Discussed future change to ACEI      Relevant Orders   TSH   Health maintenance examination - Primary  Preventative protocols reviewed and updated unless pt declined. Discussed healthy diet and lifestyle.       Diabetes type 2, controlled    Chronic, stable. Continue amaryl and metformin.      Relevant Medications   glimepiride (AMARYL) tablet   Other Relevant Orders   Hemoglobin A1c   Microalbumin / creatinine urine ratio       Follow up plan: Return in about 6 months (around 06/11/2015), or as needed, for follow up visit.

## 2014-12-09 NOTE — Assessment & Plan Note (Signed)
Chronic, stable. Discussed future change to ACEI

## 2014-12-09 NOTE — Assessment & Plan Note (Signed)
Preventative protocols reviewed and updated unless pt declined. Discussed healthy diet and lifestyle.  

## 2014-12-09 NOTE — Assessment & Plan Note (Addendum)
Discussed healthy diet and lifestyle changes to affect sustainable weight loss. Body mass index is 38.14 kg/(m^2).

## 2014-12-09 NOTE — Assessment & Plan Note (Addendum)
Reviewed LFTs in past - transaminitis goes back to 2012. Discussed relation of obesity with NAFLD. Check iron panel today.  Consider acute hep panel in the future. Has not had abd [place] in past.

## 2014-12-09 NOTE — Patient Instructions (Signed)
You are doing well. Blood work today. Work on [employer] diet and increased regular aerobic exercise

## 2014-12-09 NOTE — Assessment & Plan Note (Signed)
Chronic, stable. Continue amaryl and metformin.

## 2014-12-09 NOTE — Assessment & Plan Note (Signed)
Chronic, stable. Continue pravastatin. Check FLP today.  

## 2014-12-09 NOTE — Progress Notes (Signed)
Pre visit review using our clinic review tool, if applicable. No additional management support is needed unless otherwise documented below in the visit note. 

## 2014-12-10 ENCOUNTER — Telehealth: Payer: Self-pay | Admitting: Family Medicine

## 2014-12-10 NOTE — Telephone Encounter (Signed)
emmi mailed  °

## 2014-12-10 NOTE — Addendum Note (Signed)
Addended by: Baldomero LamyHAVERS, NATASHA C on: 12/10/2014 03:43 PM   Modules accepted: Kipp BroodSmartSet

## 2014-12-14 ENCOUNTER — Other Ambulatory Visit: Payer: Self-pay | Admitting: Family Medicine

## 2014-12-14 MED ORDER — METFORMIN HCL 1000 MG PO TABS: ORAL_TABLET | ORAL | Status: DC

## 2014-12-25 ENCOUNTER — Other Ambulatory Visit: Payer: Self-pay

## 2014-12-25 MED ORDER — PRAVASTATIN SODIUM 40 MG PO TABS: 40.0000 mg | ORAL_TABLET | Freq: Every day | ORAL | Status: DC

## 2014-12-25 NOTE — Telephone Encounter (Signed)
Pt left v/m requesting refill on Pravastatin;last filled by Dr Kriste BasqueNadel. My chart note from last lab said may need to change to more potent med for cholesterol.Please advise.

## 2015-01-16 ENCOUNTER — Telehealth: Payer: Self-pay | Admitting: Family Medicine

## 2015-01-16 MED ORDER — ATENOLOL 100 MG PO TABS
100.0000 mg | ORAL_TABLET | Freq: Every day | ORAL | Status: DC
Start: 2015-01-16 — End: 2016-01-06

## 2015-01-16 NOTE — Telephone Encounter (Signed)
Sent in

## 2015-01-16 NOTE — Telephone Encounter (Signed)
Pt called he needs rx refill on  Atenolol  [facility]

## 2015-01-27 LAB — HM DIABETES EYE EXAM

## 2015-02-03 ENCOUNTER — Encounter: Payer: Self-pay | Admitting: Family Medicine

## 2015-02-03 ENCOUNTER — Ambulatory Visit (INDEPENDENT_AMBULATORY_CARE_PROVIDER_SITE_OTHER): Payer: 59 | Admitting: Family Medicine

## 2015-02-03 VITALS — BP 136/82 | HR 64 | Temp 98.3°F | Wt 303.2 lb

## 2015-02-03 DIAGNOSIS — E1342 Other specified diabetes mellitus with diabetic polyneuropathy: Secondary | ICD-10-CM

## 2015-02-03 DIAGNOSIS — G629 Polyneuropathy, unspecified: Secondary | ICD-10-CM

## 2015-02-03 DIAGNOSIS — M79671 Pain in right foot: Secondary | ICD-10-CM | POA: Insufficient documentation

## 2015-02-03 DIAGNOSIS — IMO0001 Reserved for inherently not codable concepts without codable children: Secondary | ICD-10-CM

## 2015-02-03 DIAGNOSIS — E1142 Type 2 diabetes mellitus with diabetic polyneuropathy: Secondary | ICD-10-CM

## 2015-02-03 DIAGNOSIS — M79672 Pain in left foot: Secondary | ICD-10-CM

## 2015-02-03 MED ORDER — GABAPENTIN 300 MG PO CAPS: ORAL_CAPSULE | ORAL | Status: DC

## 2015-02-03 NOTE — Patient Instructions (Addendum)
I'm suspicious for diabetic neuropathy - trial gabapentin nerve pain medicine take 300mg  nightly for 4 days and if tolerated and not controlling symptoms, may increase to 300mg  twice daily. Common side effect to watch for is dizziness or sedation.  Diabetic Neuropathy Diabetic neuropathy is a nerve disease or nerve damage that is caused by diabetes mellitus. About half of all people with diabetes mellitus have some form of nerve damage. Nerve damage is more common in those who have had diabetes mellitus for many years and who generally have not had good control of their blood sugar (glucose) level. Diabetic neuropathy is a common complication of diabetes mellitus. There are three more common types of diabetic neuropathy and a fourth type that is less common and less understood:   Peripheral neuropathy--This is the most common type of diabetic neuropathy. It causes damage to the nerves of the feet and legs first and then eventually the hands and arms.The damage affects the ability to sense touch.  Autonomic neuropathy--This type causes damage to the autonomic nervous system, which controls the following functions:  Heartbeat.  Body temperature.  Blood pressure.  Urination.  Digestion.  Sweating.  Sexual function.  Focal neuropathy--Focal neuropathy can be painful and unpredictable and occurs most often in older adults with diabetes mellitus. It involves a specific nerve or one area and often comes on suddenly. It usually does not cause long-term problems.  Radiculoplexus neuropathy-- Sometimes called lumbosacral radiculoplexus neuropathy, radiculoplexus neuropathy affects the nerves of the thighs, hips, buttocks, or legs. It is more common in people with type 2 diabetes mellitus and in older men. It is characterized by debilitating pain, weakness, and atrophy, usually in the thigh muscles. CAUSES  The cause of peripheral, autonomic, and focal neuropathies is diabetes mellitus that is  uncontrolled and high glucose levels. The cause of radiculoplexus neuropathy is unknown. However, it is thought to be caused by inflammation related to uncontrolled glucose levels. SIGNS AND SYMPTOMS  Peripheral Neuropathy Peripheral neuropathy develops slowly over time. When the nerves of the feet and legs no longer work there may be:   Burning, stabbing, or aching pain in the legs or feet.  Inability to feel pressure or pain in your feet. This can lead to:  Thick calluses over pressure areas.  Pressure sores.  Ulcers.  Foot deformities.  Reduced ability to feel temperature changes.  Muscle weakness. Autonomic Neuropathy The symptoms of autonomic neuropathy vary depending on which nerves are affected. Symptoms may include:  Problems with digestion, such as:  Feeling sick to your stomach (nausea).  Vomiting.  Bloating.  Constipation.  Diarrhea.  Abdominal pain.  Difficulty with urination. This occurs if you lose your ability to sense when your bladder is full. Problems include:  Urine leakage (incontinence).  Inability to empty your bladder completely (retention).  Rapid or irregular heartbeat (palpitations).  Blood pressure drops when you stand up (orthostatic hypotension). When you stand up you may feel:  Dizzy.  Weak.  Faint.  In men, inability to attain and maintain an erection.  In women, vaginal dryness and problems with decreased sexual desire and arousal.  Problems with body temperature regulation.  Increased or decreased sweating. Focal Neuropathy  Abnormal eye movements or abnormal alignment of both eyes.  Weakness in the wrist.  Foot drop. This results in an inability to lift the foot properly and abnormal walking or foot movement.  Paralysis on one side of your face (Bell palsy).  Chest or abdominal pain. Radiculoplexus Neuropathy  Sudden, severe pain  in your hip, thigh, or buttocks.  Weakness and wasting of thigh  muscles.  Difficulty rising from a seated position.  Abdominal swelling.  Unexplained weight loss (usually more than 10 lb [4.5 kg]). DIAGNOSIS  Peripheral Neuropathy Your senses may be tested. Sensory function testing can be done with:  A light touch using a monofilament.  A vibration with tuning fork.  A sharp sensation with a pin prick. Other tests that can help diagnose neuropathy are:  Nerve conduction velocity. This test checks the transmission of an electrical current through a nerve.  Electromyography. This shows how muscles respond to electrical signals transmitted by nearby nerves.  Quantitative sensory testing. This is used to assess how your nerves respond to vibrations and changes in temperature. Autonomic Neuropathy Diagnosis is often based on reported symptoms. Tell your health care provider if you experience:   Dizziness.   Constipation.   Diarrhea.   Inappropriate urination or inability to urinate.   Inability to get or maintain an erection.  Tests that may be done include:   Electrocardiography or Holter monitor. These are tests that can help show problems with the heart rate or heart rhythm.   An X-ray exam may be done. Focal Neuropathy Diagnosis is made based on your symptoms and what your health care provider finds during your exam. Other tests may be done. They may include:  Nerve conduction velocities. This checks the transmission of electrical current through a nerve.  Electromyography. This shows how muscles respond to electrical signals transmitted by nearby nerves.  Quantitative sensory testing. This test is used to assess how your nerves respond to vibration and changes in temperature. Radiculoplexus Neuropathy  Often the first thing is to eliminate any other issue or problems that might be the cause, as there is no stick test for diagnosis.  X-ray exam of your spine and lumbar region.  Spinal tap to rule out cancer.  MRI to  rule out other lesions. TREATMENT  Once nerve damage occurs, it cannot be reversed. The goal of treatment is to keep the disease or nerve damage from getting worse and affecting more nerve fibers. Controlling your blood glucose level is the key. Most people with radiculoplexus neuropathy see at least a partial improvement over time. You will need to keep your blood glucose and HbA1c levels in the target range determined by your health care provider. Things that help control blood glucose levels include:   Blood glucose monitoring.   Meal planning.   Physical activity.   Diabetes medicine.  Over time, maintaining lower blood glucose levels helps lessen symptoms. Sometimes, prescription pain medicine is needed. HOME CARE INSTRUCTIONS:  Do not smoke.  Keep your blood glucose level in the range that you and your health care provider have determined acceptable for you.  Keep your blood pressure level in the range that you and your health care provider have determined acceptable for you.  Eat a well-balanced diet.  Be active every day.  Check your feet every day. SEEK MEDICAL CARE IF:   You have burning, stabbing, or aching pain in the legs or feet.  You are unable to feel pressure or pain in your feet.  You develop problems with digestion such as:  Nausea.  Vomiting.  Bloating.  Constipation.  Diarrhea.  Abdominal pain.  You have difficulty with urination, such as:  Incontinence.  Retention.  You have palpitations.  You develop orthostatic hypotension. When you stand up you may feel:  Dizzy.  Weak.  Faint.  You cannot attain and maintain an erection (in men).  You have vaginal dryness and problems with decreased sexual desire and arousal (in women).  You have severe pain in your thighs, legs, or buttocks.  You have unexplained weight loss. Document Released: 11/14/2001 Document Revised: 06/26/2013 Document Reviewed: 02/14/2013 [facility] Patient  Information 2015 [employer] [place]. This information is not intended to replace advice given to you by your health care provider. Make sure you discuss any questions you have with your health care provider.

## 2015-02-03 NOTE — Assessment & Plan Note (Signed)
Congratulated on 14 lb weight loss with diet changes to date. Pt feels this is sustainable.

## 2015-02-03 NOTE — Progress Notes (Signed)
BP 136/82 mmHg  Pulse 64  Temp(Src) 98.3 F (36.8 C) (Oral)  Wt 303 lb 4 oz (137.553 kg)   CC: paresthesias  Subjective:    Patient ID: James [name], male    DOB: December 25, 1980, 34 y.o.   MRN: [id number]  HPI: James [name] is a 34 y.o. male presenting on 02/03/2015 for Tingling   Patient with diabetes, hypertension and hyperlipidemia presents with 1 mo h/o paresthesias bilateral feet - noted worse with sitting or sleeping. Predominantly at toes and soles. Describes electrical pain of soles. No burning, no ankle pain, no leg swelling. Denies back pain or inciting trauma/injury.  DM - compliant with metformin 1000mg  bid and amaryl 4mg  daily. Sugar 78 last night after dinner.  Checks sugars intermittently. Lab Results  Component Value Date   HGBA1C 7.6* 12/09/2014     Dropping 14lbs with diet changes - less fast food, more grilled chicken with beans, decreased sweetened beverages and added sugars.   Relevant past medical, surgical, family and social history reviewed and updated as indicated. Interim medical history since our last visit reviewed. Allergies and medications reviewed and updated. Current Outpatient Prescriptions on File Prior to Visit  Medication Sig  . atenolol (TENORMIN) 100 MG tablet Take 1 tablet (100 mg total) by mouth daily.  Marland Kitchen. glimepiride (AMARYL) 4 MG tablet Take 1 tablet (4 mg total) by mouth daily with breakfast.  . glucose blood (BAYER CONTOUR NEXT TEST) test strip Use as instructed  . metFORMIN (GLUCOPHAGE) 1000 MG tablet TAKE 1 TABLET (1000 MG TOTAL) BY MOUTH 2 (TWO) TIMES DAILY WITH A MEAL.  . pravastatin (PRAVACHOL) 40 MG tablet Take 1 tablet (40 mg total) by mouth at bedtime.  . [DISCONTINUED] metoprolol (TOPROL-XL) 50 MG 24 hr tablet Take 50 mg by mouth daily.     No current facility-administered medications on file prior to visit.    Review of Systems Per HPI unless specifically indicated above     Objective:    BP 136/82 mmHg  Pulse  64  Temp(Src) 98.3 F (36.8 C) (Oral)  Wt 303 lb 4 oz (137.553 kg)  Wt Readings from Last 3 Encounters:  02/03/15 303 lb 4 oz (137.553 kg)  12/09/14 317 lb 6.4 oz (143.972 kg)  10/16/14 309 lb 4 oz (140.275 kg)   Body mass index is 36.44 kg/(m^2).  Physical Exam  Constitutional: He appears well-developed and well-nourished. No distress.  Musculoskeletal: He exhibits no edema.  No deformity or tenderness to palpation today. See foot exam  Nursing note and vitals reviewed.  Diabetic Foot Exam - Simple   Simple Foot Form  Diabetic Foot exam was performed with the following findings:  Yes 02/03/2015  8:20 AM  Visual Inspection  No deformities, no ulcerations, no other skin breakdown bilaterally:  Yes  Sensation Testing  Intact to touch and monofilament testing bilaterally:  Yes  Pulse Check  Posterior Tibialis and Dorsalis pulse intact bilaterally:  Yes  Comments         Assessment & Plan:   Problem List Items Addressed This Visit    Diabetic peripheral neuropathy - Primary    Concern for diabetic peripheral neuropathy - discussed with patient. Encouraged continued glycemic control. Bothersome to the point of wanting to try medication for this so will taper gabapentin. Discussed side effects to monitor. Start gabapentin 300mg  nightly x 4d with option to increase to bid.      Relevant Medications   gabapentin (NEURONTIN) 300 MG capsule   Obesity, Class II,  BMI 35-39.9, with comorbidity    Congratulated on 14 lb weight loss with diet changes to date. Pt feels this is sustainable.          Follow up plan: Return if symptoms worsen or fail to improve.

## 2015-02-03 NOTE — Progress Notes (Signed)
Pre visit review using our clinic review tool, if applicable. No additional management support is needed unless otherwise documented below in the visit note. 

## 2015-02-03 NOTE — Assessment & Plan Note (Signed)
Concern for diabetic peripheral neuropathy - discussed with patient. Encouraged continued glycemic control. Bothersome to the point of wanting to try medication for this so will taper gabapentin. Discussed side effects to monitor. Start gabapentin 300mg  nightly x 4d with option to increase to bid.

## 2015-02-04 ENCOUNTER — Encounter: Payer: Self-pay | Admitting: Family Medicine

## 2015-02-04 NOTE — Telephone Encounter (Signed)
Please see Mychart message from patient.  

## 2015-04-04 ENCOUNTER — Other Ambulatory Visit: Payer: Self-pay | Admitting: Family Medicine

## 2015-06-12 ENCOUNTER — Ambulatory Visit (INDEPENDENT_AMBULATORY_CARE_PROVIDER_SITE_OTHER): Payer: 59 | Admitting: Family Medicine

## 2015-06-12 ENCOUNTER — Encounter: Payer: Self-pay | Admitting: Family Medicine

## 2015-06-12 VITALS — BP 120/80 | HR 71 | Temp 97.6°F | Resp 16 | Wt 297.2 lb

## 2015-06-12 DIAGNOSIS — E782 Mixed hyperlipidemia: Secondary | ICD-10-CM | POA: Diagnosis not present

## 2015-06-12 DIAGNOSIS — K760 Fatty (change of) liver, not elsewhere classified: Secondary | ICD-10-CM | POA: Diagnosis not present

## 2015-06-12 DIAGNOSIS — Z23 Encounter for immunization: Secondary | ICD-10-CM

## 2015-06-12 DIAGNOSIS — I10 Essential (primary) hypertension: Secondary | ICD-10-CM

## 2015-06-12 DIAGNOSIS — G629 Polyneuropathy, unspecified: Secondary | ICD-10-CM

## 2015-06-12 DIAGNOSIS — E119 Type 2 diabetes mellitus without complications: Secondary | ICD-10-CM

## 2015-06-12 DIAGNOSIS — E1342 Other specified diabetes mellitus with diabetic polyneuropathy: Secondary | ICD-10-CM

## 2015-06-12 DIAGNOSIS — IMO0001 Reserved for inherently not codable concepts without codable children: Secondary | ICD-10-CM

## 2015-06-12 DIAGNOSIS — E1142 Type 2 diabetes mellitus with diabetic polyneuropathy: Secondary | ICD-10-CM

## 2015-06-12 LAB — COMPREHENSIVE METABOLIC PANEL
ALBUMIN: 4.6 g/dL (ref 3.5–5.2)
ALK PHOS: 50 U/L (ref 39–117)
ALT: 41 U/L (ref 0–53)
AST: 30 U/L (ref 0–37)
BILIRUBIN TOTAL: 0.7 mg/dL (ref 0.2–1.2)
BUN: 12 mg/dL (ref 6–23)
CO2: 26 mEq/L (ref 19–32)
CREATININE: 1.01 mg/dL (ref 0.40–1.50)
Calcium: 9.7 mg/dL (ref 8.4–10.5)
Chloride: 103 mEq/L (ref 96–112)
GFR: 89.46 mL/min (ref >60.00)
Glucose, Bld: 86 mg/dL (ref 70–99)
Potassium: 4.2 mEq/L (ref 3.5–5.1)
Sodium: 138 mEq/L (ref 135–145)
TOTAL PROTEIN: 7.8 g/dL (ref 6.0–8.3)

## 2015-06-12 LAB — LDL CHOLESTEROL, DIRECT: Direct LDL: 103 mg/dL

## 2015-06-12 LAB — HEMOGLOBIN A1C: Hgb A1c MFr Bld: 5.5 % (ref 4.6–6.5)

## 2015-06-12 MED ORDER — GABAPENTIN 400 MG PO CAPS: 400.0000 mg | ORAL_CAPSULE | Freq: Two times a day (BID) | ORAL | Status: DC

## 2015-06-12 NOTE — Assessment & Plan Note (Signed)
Improved with gabapentin but persistent - increase to  bid. Some loss of long arch noted. Discussed shoeware with arch support.

## 2015-06-12 NOTE — Assessment & Plan Note (Signed)
Chronic, stable. Continue pravastatin. Check dLDL

## 2015-06-12 NOTE — Patient Instructions (Addendum)
Flu shot today. Increase gabapentin to  twice daily. Let me know how you do with this. labwork today. Return in 6 months for physical

## 2015-06-12 NOTE — Progress Notes (Signed)
BP 120/80 mmHg  Pulse 71  Temp(Src) 97.6 F (36.4 C)  Resp 16  Wt 297 lb 4 oz (134.832 kg)  SpO2 97%   CC: 30mo f/u visit  Subjective:    Patient ID: James [name], male    DOB: Feb 11, 1981, 34 y.o.   MRN: [id number]  HPI: James [name] is a 34 y.o. male presenting on 06/12/2015 for Follow-up   DM - regularly does check sugars twice daily, right after meal 100s. Compliant with antihyperglycemic regimen which includes: metformin  bid and amaryl  daily. Denies low sugars or hypoglycemic symptoms.  + paresthesias - started gabapentin last visit  BID - this has helped but persistent sxs.Last diabetic eye exam 01/2015.  Pneumovax: 05/2014.  Prevnar: not due yet. Lab Results  Component Value Date   HGBA1C 7.6* 12/09/2014   Diabetic Foot Exam - Simple   Simple Foot Form  Diabetic Foot exam was performed with the following findings:  Yes 06/12/2015 10:02 AM  Visual Inspection  No deformities, no ulcerations, no other skin breakdown bilaterally:  Yes  Sensation Testing  Intact to touch and monofilament testing bilaterally:  Yes  Pulse Check  Posterior Tibialis and Dorsalis pulse intact bilaterally:  Yes  Comments  Erythematous papular patch on skin bilateral feet       Obesity - continued weight loss noted. Congratulated.  Relevant past medical, surgical, family and social history reviewed and updated as indicated. Interim medical history since our last visit reviewed. Allergies and medications reviewed and updated. Current Outpatient Prescriptions on File Prior to Visit  Medication Sig  . atenolol (TENORMIN) 100 MG tablet Take 1 tablet (100 mg total) by mouth daily.  Marland Kitchen glimepiride (AMARYL) 4 MG tablet Take 1 tablet (4 mg total) by mouth daily with breakfast.  . glucose blood (BAYER CONTOUR NEXT TEST) test strip Use as instructed  . metFORMIN (GLUCOPHAGE) 1000 MG tablet TAKE 1 TABLET (1000 MG TOTAL) BY MOUTH 2 (TWO) TIMES DAILY WITH A MEAL.  . pravastatin  (PRAVACHOL) 40 MG tablet Take 1 tablet (40 mg total) by mouth at bedtime.  . [DISCONTINUED] metoprolol (TOPROL-XL) 50 MG 24 hr tablet Take 50 mg by mouth daily.     No current facility-administered medications on file prior to visit.    Review of Systems Per HPI unless specifically indicated above     Objective:    BP 120/80 mmHg  Pulse 71  Temp(Src) 97.6 F (36.4 C)  Resp 16  Wt 297 lb 4 oz (134.832 kg)  SpO2 97%  Wt Readings from Last 3 Encounters:  06/12/15 297 lb 4 oz (134.832 kg)  02/03/15 303 lb 4 oz (137.553 kg)  12/09/14 317 lb 6.4 oz (143.972 kg)   Body mass index is 35.71 kg/(m^2).  Physical Exam  Constitutional: He appears well-developed and well-nourished. No distress.  HENT:  Head: Normocephalic and atraumatic.  Right Ear: External ear normal.  Left Ear: External ear normal.  Nose: Nose normal.  Mouth/Throat: Oropharynx is clear and moist. No oropharyngeal exudate.  Eyes: Conjunctivae and EOM are normal. Pupils are equal, round, and reactive to light. No scleral icterus.  Neck: Normal range of motion. Neck supple.  Cardiovascular: Normal rate, regular rhythm, normal heart sounds and intact distal pulses.   No murmur heard. Pulmonary/Chest: Effort normal and breath sounds normal. No respiratory distress. He has no wheezes. He has no rales.  Musculoskeletal: He exhibits no edema.  See HPI for foot exam if done  Lymphadenopathy:    He has  no cervical adenopathy.  Skin: Skin is warm and dry. No rash noted.  Psychiatric: He has a normal mood and affect.  Nursing note and vitals reviewed.  Results for orders placed or performed in visit on 02/03/15  HM DIABETES EYE EXAM  Result Value Ref Range   HM Diabetic Eye Exam No Retinopathy No Retinopathy      Assessment & Plan:   Problem List Items Addressed This Visit    Obesity, Class II, BMI 35-39.9, with comorbidity    Congratulated on weight loss to date. Pt working on healthy diet and lifestyle changes.       NAFLD (nonalcoholic fatty liver disease)   Relevant Orders   Comprehensive metabolic panel   Mixed hyperlipidemia    Chronic, stable. Continue pravastatin. Check dLDL       Relevant Orders   Comprehensive metabolic panel   LDL Cholesterol, Direct   HTN (hypertension)    Chronic, stable. Continue atenolol  daily. Consider change to ACEI      Diabetic peripheral neuropathy    Improved with gabapentin but persistent - increase to  bid. Some loss of long arch noted. Discussed shoeware with arch support.      Relevant Medications   gabapentin (NEURONTIN) 400 MG capsule   Diabetes type 2, controlled - Primary    Chronic, stable. Continue amaryl and metformin. Check A1c and anticipate will be able to slowly start tapering antihyperglycemics.      Relevant Orders   Hemoglobin A1c    Other Visit Diagnoses    Need for prophylactic vaccination and inoculation against influenza        Relevant Orders    Flu Vaccine QUAD 36+ mos IM (Completed)        Follow up plan: Return in about 6 months (around 12/10/2015), or as needed, for annual exam, prior fasting for blood work.

## 2015-06-12 NOTE — Assessment & Plan Note (Signed)
Congratulated on weight loss to date. Pt working on healthy diet and lifestyle changes.

## 2015-06-12 NOTE — Assessment & Plan Note (Signed)
Chronic, stable. Continue amaryl and metformin. Check A1c and anticipate will be able to slowly start tapering antihyperglycemics.

## 2015-06-12 NOTE — Assessment & Plan Note (Signed)
Chronic, stable. Continue atenolol  daily. Consider change to ACEI

## 2015-06-12 NOTE — Progress Notes (Signed)
Pre visit review using our clinic review tool, if applicable. No additional management support is needed unless otherwise documented below in the visit note. 

## 2015-06-13 ENCOUNTER — Other Ambulatory Visit: Payer: Self-pay | Admitting: Family Medicine

## 2015-09-18 ENCOUNTER — Ambulatory Visit: Payer: Self-pay | Admitting: Family Medicine

## 2015-10-06 ENCOUNTER — Ambulatory Visit: Payer: Self-pay | Admitting: Family Medicine

## 2015-10-14 ENCOUNTER — Ambulatory Visit (INDEPENDENT_AMBULATORY_CARE_PROVIDER_SITE_OTHER): Payer: 59 | Admitting: Family Medicine

## 2015-10-14 ENCOUNTER — Encounter: Payer: Self-pay | Admitting: Family Medicine

## 2015-10-14 VITALS — BP 120/84 | HR 96 | Temp 98.5°F | Wt 301.5 lb

## 2015-10-14 DIAGNOSIS — R05 Cough: Secondary | ICD-10-CM | POA: Diagnosis not present

## 2015-10-14 DIAGNOSIS — R059 Cough, unspecified: Secondary | ICD-10-CM | POA: Insufficient documentation

## 2015-10-14 MED ORDER — AZITHROMYCIN 250 MG PO TABS: ORAL_TABLET | ORAL | Status: DC

## 2015-10-14 MED ORDER — BENZONATATE 200 MG PO CAPS: 200.0000 mg | ORAL_CAPSULE | Freq: Three times a day (TID) | ORAL | Status: DC | PRN

## 2015-10-14 NOTE — Patient Instructions (Signed)
Drink plenty of fluids, start zithromax and use tessalon as needed for the cough.   This should gradually improve.  Take care.  Let us know if you have other concerns.

## 2015-10-14 NOTE — Progress Notes (Signed)
Pre visit review using our clinic review tool, if applicable. No additional management support is needed unless otherwise documented below in the visit note.  Sx started last week.  Sick contact noted.  First noted cough, then discolored sputum.  No fevers.  Cough is the main problem for him.  Some possible/occ wheeze.  No rash, no ear pain.  Some ST. No vomiting, no diarrhea.  Sugar is controlled recently.  Sleeping well.    Meds, vitals, and allergies reviewed.   ROS: See HPI.  Otherwise, noncontributory.  GEN: nad, alert and oriented HEENT: mucous membranes moist, tm w/o erythema, nasal exam w/o erythema, clear discharge noted,  OP with cobblestoning NECK: supple w/o LA CV: rrr.   PULM: ctab, no inc wob EXT: no edema SKIN: no acute rash

## 2015-10-14 NOTE — Assessment & Plan Note (Signed)
Given the duration and sputum with  h/o DM2, I would treat.  D/w pt at OV.  Still okay for outpatient f/u.  Start zmax with tessalon prn.  Rest and fluids.

## 2015-10-16 ENCOUNTER — Ambulatory Visit: Payer: Self-pay | Admitting: Family Medicine

## 2015-12-05 ENCOUNTER — Other Ambulatory Visit: Payer: Self-pay | Admitting: Family Medicine

## 2015-12-05 DIAGNOSIS — E119 Type 2 diabetes mellitus without complications: Secondary | ICD-10-CM

## 2015-12-05 DIAGNOSIS — I10 Essential (primary) hypertension: Secondary | ICD-10-CM

## 2015-12-05 DIAGNOSIS — K760 Fatty (change of) liver, not elsewhere classified: Secondary | ICD-10-CM

## 2015-12-05 DIAGNOSIS — E782 Mixed hyperlipidemia: Secondary | ICD-10-CM

## 2015-12-07 ENCOUNTER — Other Ambulatory Visit (INDEPENDENT_AMBULATORY_CARE_PROVIDER_SITE_OTHER): Payer: 59

## 2015-12-07 DIAGNOSIS — E119 Type 2 diabetes mellitus without complications: Secondary | ICD-10-CM | POA: Diagnosis not present

## 2015-12-07 DIAGNOSIS — K760 Fatty (change of) liver, not elsewhere classified: Secondary | ICD-10-CM | POA: Diagnosis not present

## 2015-12-07 DIAGNOSIS — E782 Mixed hyperlipidemia: Secondary | ICD-10-CM

## 2015-12-07 DIAGNOSIS — I10 Essential (primary) hypertension: Secondary | ICD-10-CM | POA: Diagnosis not present

## 2015-12-07 LAB — HEMOGLOBIN A1C: Hgb A1c MFr Bld: 6.3 % (ref 4.6–6.5)

## 2015-12-07 LAB — BASIC METABOLIC PANEL
BUN: 11 mg/dL (ref 6–23)
CALCIUM: 9.4 mg/dL (ref 8.4–10.5)
CHLORIDE: 103 meq/L (ref 96–112)
CO2: 27 meq/L (ref 19–32)
Creatinine, Ser: 1.01 mg/dL (ref 0.40–1.50)
GFR: 89.21 mL/min (ref >60.00)
GLUCOSE: 135 mg/dL — AB (ref 70–99)
Potassium: 4.3 mEq/L (ref 3.5–5.1)
Sodium: 138 mEq/L (ref 135–145)

## 2015-12-07 LAB — MICROALBUMIN / CREATININE URINE RATIO
CREATININE, U: 192.2 mg/dL
MICROALB/CREAT RATIO: 0.5 mg/g (ref 0.0–30.0)
Microalb, Ur: 1 mg/dL (ref 0.0–1.9)

## 2015-12-07 LAB — HEPATIC FUNCTION PANEL
ALBUMIN: 4.6 g/dL (ref 3.5–5.2)
ALT: 53 U/L (ref 0–53)
AST: 33 U/L (ref 0–37)
Alkaline Phosphatase: 51 U/L (ref 39–117)
BILIRUBIN DIRECT: 0.1 mg/dL (ref 0.0–0.3)
TOTAL PROTEIN: 7.5 g/dL (ref 6.0–8.3)
Total Bilirubin: 0.4 mg/dL (ref 0.2–1.2)

## 2015-12-07 LAB — LIPID PANEL
CHOLESTEROL: 145 mg/dL (ref 0–200)
HDL: 25.9 mg/dL — AB (ref >39.00)
NonHDL: 118.87
TRIGLYCERIDES: 205 mg/dL — AB (ref 0.0–149.0)
Total CHOL/HDL Ratio: 6
VLDL: 41 mg/dL — AB (ref 0.0–40.0)

## 2015-12-07 LAB — LDL CHOLESTEROL, DIRECT: Direct LDL: 91 mg/dL

## 2015-12-11 ENCOUNTER — Encounter: Payer: 59 | Admitting: Family Medicine

## 2015-12-16 ENCOUNTER — Ambulatory Visit (INDEPENDENT_AMBULATORY_CARE_PROVIDER_SITE_OTHER): Payer: 59 | Admitting: Family Medicine

## 2015-12-16 ENCOUNTER — Encounter: Payer: Self-pay | Admitting: Family Medicine

## 2015-12-16 VITALS — BP 134/82 | HR 64 | Temp 98.2°F | Wt 293.0 lb

## 2015-12-16 DIAGNOSIS — E782 Mixed hyperlipidemia: Secondary | ICD-10-CM | POA: Diagnosis not present

## 2015-12-16 DIAGNOSIS — M79671 Pain in right foot: Secondary | ICD-10-CM

## 2015-12-16 DIAGNOSIS — E1142 Type 2 diabetes mellitus with diabetic polyneuropathy: Secondary | ICD-10-CM | POA: Diagnosis not present

## 2015-12-16 DIAGNOSIS — K760 Fatty (change of) liver, not elsewhere classified: Secondary | ICD-10-CM

## 2015-12-16 DIAGNOSIS — M79672 Pain in left foot: Secondary | ICD-10-CM

## 2015-12-16 DIAGNOSIS — IMO0001 Reserved for inherently not codable concepts without codable children: Secondary | ICD-10-CM

## 2015-12-16 DIAGNOSIS — I10 Essential (primary) hypertension: Secondary | ICD-10-CM | POA: Diagnosis not present

## 2015-12-16 DIAGNOSIS — Z0000 Encounter for general adult medical examination without abnormal findings: Secondary | ICD-10-CM

## 2015-12-16 LAB — VITAMIN B12: Vitamin B-12: 219 pg/mL (ref 211–911)

## 2015-12-16 LAB — FOLATE: Folate: 15.7 ng/mL (ref >5.9)

## 2015-12-16 MED ORDER — ATENOLOL 100 MG PO TABS: 100.0000 mg | ORAL_TABLET | Freq: Every day | ORAL | Status: DC

## 2015-12-16 MED ORDER — METFORMIN HCL 850 MG PO TABS: 850.0000 mg | ORAL_TABLET | Freq: Two times a day (BID) | ORAL | Status: DC

## 2015-12-16 MED ORDER — GABAPENTIN 300 MG PO CAPS: 300.0000 mg | ORAL_CAPSULE | ORAL | Status: DC

## 2015-12-16 MED ORDER — PRAVASTATIN SODIUM 40 MG PO TABS: 40.0000 mg | ORAL_TABLET | Freq: Every day | ORAL | Status: DC

## 2015-12-16 NOTE — Patient Instructions (Addendum)
We will check further labwork today and refer you for nerve conduction study for further evaluation of neuropathy. Increase gabapentin to  in the morning and  at night - new dose sent to pharmacy. Call us with update in 1 month. Try arch support insoles in shoes.  Return for f/u visit in 3 months  Health Maintenance, Male A healthy lifestyle and preventative care can promote health and wellness.  Maintain regular health, dental, and eye exams.  Eat a healthy diet. Foods like vegetables, fruits, whole grains, low-fat dairy products, and lean protein foods contain the nutrients you need and are low in calories. Decrease your intake of foods high in solid fats, added sugars, and salt. Get information about a proper diet from your health care provider, if necessary.  Regular physical exercise is one of the most important things you can do for your health. Most adults should get at least 150 minutes of moderate-intensity exercise (any activity that increases your heart rate and causes you to sweat) each week. In addition, most adults need muscle-strengthening exercises on 2 or more days a week.   Maintain a healthy weight. The body mass index (BMI) is a screening tool to identify possible weight problems. It provides an estimate of body fat based on height and weight. Your health care provider can find your BMI and can help you achieve or maintain a healthy weight. For males 20 years and older:  A BMI below 18.5 is considered underweight.  A BMI of 18.5 to 24.9 is normal.  A BMI of 25 to 29.9 is considered overweight.  A BMI of 30 and above is considered obese.  Maintain normal blood lipids and cholesterol by exercising and minimizing your intake of saturated fat. Eat a balanced diet with plenty of fruits and vegetables. Blood tests for lipids and cholesterol should begin at age 97 and be repeated every 5 years. If your lipid or cholesterol levels are high, you are over age 65, or you  are at high risk for heart disease, you may need your cholesterol levels checked more frequently.Ongoing high lipid and cholesterol levels should be treated with medicines if diet and exercise are not working.  If you smoke, find out from your health care provider how to quit. If you do not use tobacco, do not start.  Lung cancer screening is recommended for adults aged 55-80 years who are at high risk for developing lung cancer because of a history of smoking. A yearly low-dose CT scan of the lungs is recommended for people who have at least a 30-pack-year history of smoking and are current smokers or have quit within the past 15 years. A pack year of smoking is smoking an average of 1 pack of cigarettes a day for 1 year (for example, a 30-pack-year history of smoking could mean smoking 1 pack a day for 30 years or 2 packs a day for 15 years). Yearly screening should continue until the smoker has stopped smoking for at least 15 years. Yearly screening should be stopped for people who develop a health problem that would prevent them from having lung cancer treatment.  If you choose to drink alcohol, do not have more than 2 drinks per day. One drink is considered to be 12 oz (360 mL) of beer, 5 oz (150 mL) of wine, or 1.5 oz (45 mL) of liquor.  Avoid the use of street drugs. Do not share needles with anyone. Ask for help if you need support or instructions about  stopping the use of drugs.  High blood pressure causes heart disease and increases the risk of stroke. High blood pressure is more likely to develop in:  People who have blood pressure in the end of the normal range (100-139/85-89 mm Hg).  People who are overweight or obese.  People who are African American.  If you are 90-3 years of age, have your blood pressure checked every 3-5 years. If you are 59 years of age or older, have your blood pressure checked every year. You should have your blood pressure measured twice--once when you are at  a hospital or clinic, and once when you are not at a hospital or clinic. Record the average of the two measurements. To check your blood pressure when you are not at a hospital or clinic, you can use:  An automated blood pressure machine at a pharmacy.  A home blood pressure monitor.  If you are 37-53 years old, ask your health care provider if you should take aspirin to prevent heart disease.  Diabetes screening involves taking a blood sample to check your fasting blood sugar level. This should be done once every 3 years after age 19 if you are at a normal weight and without risk factors for diabetes. Testing should be considered at a younger age or be carried out more frequently if you are overweight and have at least 1 risk factor for diabetes.  Colorectal cancer can be detected and often prevented. Most routine colorectal cancer screening begins at the age of 89 and continues through age 62. However, your health care provider may recommend screening at an earlier age if you have risk factors for colon cancer. On a yearly basis, your health care provider may provide home test kits to check for hidden blood in the stool. A small camera at the end of a tube may be used to directly examine the colon (sigmoidoscopy or colonoscopy) to detect the earliest forms of colorectal cancer. Talk to your health care provider about this at age 79 when routine screening begins. A direct exam of the colon should be repeated every 5-10 years through age 70, unless early forms of precancerous polyps or small growths are found.  People who are at an increased risk for hepatitis B should be screened for this virus. You are considered at high risk for hepatitis B if:  You were born in a country where hepatitis B occurs often. Talk with your health care provider about which countries are considered high risk.  Your parents were born in a high-risk country and you have not received a shot to protect against hepatitis B  (hepatitis B vaccine).  You have HIV or AIDS.  You use needles to inject street drugs.  You live with, or have sex with, someone who has hepatitis B.  You are a man who has sex with other men (MSM).  You get hemodialysis treatment.  You take certain medicines for conditions like cancer, organ transplantation, and autoimmune conditions.  Hepatitis C blood testing is recommended for all people born from [birth year] through 1966 and any individual with known risk factors for hepatitis C.  Healthy men should no longer receive prostate-specific antigen (PSA) blood tests as part of routine cancer screening. Talk to your health care provider about prostate cancer screening.  Testicular cancer screening is not recommended for adolescents or adult males who have no symptoms. Screening includes self-exam, a health care provider exam, and other screening tests. Consult with your health care provider about  any symptoms you have or any concerns you have about testicular cancer.  Practice safe sex. Use condoms and avoid high-risk sexual practices to reduce the spread of sexually transmitted infections (STIs).  You should be screened for STIs, including gonorrhea and chlamydia if:  You are sexually active and are younger than 24 years.  You are older than 24 years, and your health care provider tells you that you are at risk for this type of infection.  Your sexual activity has changed since you were last screened, and you are at an increased risk for chlamydia or gonorrhea. Ask your health care provider if you are at risk.  If you are at risk of being infected with HIV, it is recommended that you take a prescription medicine daily to prevent HIV infection. This is called pre-exposure prophylaxis (PrEP). You are considered at risk if:  You are a man who has sex with other men (MSM).  You are a heterosexual man who is sexually active with multiple partners.  You take drugs by injection.  You are  sexually active with a partner who has HIV.  Talk with your health care provider about whether you are at high risk of being infected with HIV. If you choose to begin PrEP, you should first be tested for HIV. You should then be tested every 3 months for as long as you are taking PrEP.  Use sunscreen. Apply sunscreen liberally and repeatedly throughout the day. You should seek shade when your shadow is shorter than you. Protect yourself by wearing long sleeves, pants, a wide-brimmed hat, and sunglasses year round whenever you are outdoors.  Tell your health care provider of new moles or changes in moles, especially if there is a change in shape or color. Also, tell your health care provider if a mole is larger than the size of a pencil eraser.  A one-time screening for abdominal aortic aneurysm (AAA) and surgical repair of large AAAs by ultrasound is recommended for men aged 65-75 years who are current or former smokers.  Stay current with your vaccines (immunizations).   This information is not intended to replace advice given to you by your health care provider. Make sure you discuss any questions you have with your health care provider.   Document Released: 03/03/2008 Document Revised: 09/26/2014 Document Reviewed: 01/31/2011 Elsevier Interactive Patient Education [employer]2016 [employer]

## 2015-12-16 NOTE — Assessment & Plan Note (Signed)
Preventative protocols reviewed and updated unless pt declined. Discussed healthy diet and lifestyle.  

## 2015-12-16 NOTE — Assessment & Plan Note (Signed)
Chronic, stable. Continue atenolol 100mg  daily. Consider ACEI

## 2015-12-16 NOTE — Progress Notes (Signed)
BP 134/82 mmHg  Pulse 64  Temp(Src) 98.2 F (36.8 C) (Oral)  Wt 293 lb (132.904 kg)   CC: CPE  Subjective:    Patient ID: James [name], male    DOB: 1981-02-20, 35 y.o.   MRN: [id number]  HPI: James [name] is a 35 y.o. male presenting on 12/16/2015 for Annual Exam   Some work stress.  DM - complaint with metformin 1000mg  bid but this is causing stomach upset, diarrhea. Also endorsing worsening sharp paresthesias of feet and some on right hand. Would like increased gabapentin dose.   Diabetic Foot Exam - Simple   Simple Foot Form  Diabetic Foot exam was performed with the following findings:  Yes 12/16/2015  8:16 AM  Visual Inspection  No deformities, no ulcerations, no other skin breakdown bilaterally:  Yes  Sensation Testing  Intact to touch and monofilament testing bilaterally:  Yes  Pulse Check  Posterior Tibialis and Dorsalis pulse intact bilaterally:  Yes  Comments  2+ DP       Preventative: Flu shot yearly Tdap 04/2014 Pneumovax 05/2014 Seat belt use discussed Sunscreen use discussed. No changing moles on skin.  Lives alone Occupation: works at [employer]: [employer]  Activity: 30 min/day 3x/wk on treadmill  Diet: good water, fruits/vegetables daily, avoids fast foods  Relevant past medical, surgical, family and social history reviewed and updated as indicated. Interim medical history since our last visit reviewed. Allergies and medications reviewed and updated. Current Outpatient Prescriptions on File Prior to Visit  Medication Sig  . glucose blood (BAYER CONTOUR NEXT TEST) test strip Use as instructed  . [DISCONTINUED] metoprolol (TOPROL-XL) 50 MG 24 hr tablet Take 50 mg by mouth daily.     No current facility-administered medications on file prior to visit.    Review of Systems  Constitutional: Negative for fever, chills, activity change, appetite change, fatigue and unexpected weight change.  HENT: Negative for hearing loss.     Eyes: Negative for visual disturbance.  Respiratory: Negative for cough, chest tightness, shortness of breath and wheezing.   Cardiovascular: Negative for chest pain, palpitations and leg swelling.  Gastrointestinal: Negative for nausea, vomiting, abdominal pain, diarrhea, constipation, blood in stool and abdominal distention.  Genitourinary: Negative for hematuria and difficulty urinating.  Musculoskeletal: Negative for myalgias, arthralgias and neck pain.  Skin: Negative for rash.  Neurological: Negative for dizziness, seizures, syncope and headaches.  Hematological: Negative for adenopathy. Does not bruise/bleed easily.  Psychiatric/Behavioral: Positive for dysphoric mood (work related). The patient is not nervous/anxious.    Per HPI unless specifically indicated in ROS section     Objective:    BP 134/82 mmHg  Pulse 64  Temp(Src) 98.2 F (36.8 C) (Oral)  Wt 293 lb (132.904 kg)  Wt Readings from Last 3 Encounters:  12/16/15 293 lb (132.904 kg)  10/14/15 301 lb 8 oz (136.76 kg)  06/12/15 297 lb 4 oz (134.832 kg)    Physical Exam  Constitutional: He is oriented to person, place, and time. He appears well-developed and well-nourished. No distress.  HENT:  Head: Normocephalic and atraumatic.  Right Ear: Hearing, tympanic membrane, external ear and ear canal normal.  Left Ear: Hearing, tympanic membrane, external ear and ear canal normal.  Nose: Nose normal.  Mouth/Throat: Uvula is midline, oropharynx is clear and moist and mucous membranes are normal. No oropharyngeal exudate, posterior oropharyngeal edema or posterior oropharyngeal erythema.  Eyes: Conjunctivae and EOM are normal. Pupils are equal, round, and reactive to light. No scleral  icterus.  Neck: Normal range of motion. Neck supple. No thyromegaly present.  Cardiovascular: Normal rate, regular rhythm, normal heart sounds and intact distal pulses.   No murmur heard. Pulses:      Radial pulses are 2+ on the right side,  and 2+ on the left side.  Pulmonary/Chest: Effort normal and breath sounds normal. No respiratory distress. He has no wheezes. He has no rales.  Abdominal: Soft. Bowel sounds are normal. He exhibits no distension and no mass. There is no tenderness. There is no rebound and no guarding.  Musculoskeletal: Normal range of motion. He exhibits no edema.  See HPI for foot exam if done  Lymphadenopathy:    He has no cervical adenopathy.  Neurological: He is alert and oriented to person, place, and time.  CN grossly intact, station and gait intact  Skin: Skin is warm and dry. No rash noted.  Psychiatric: His speech is normal and behavior is normal. Judgment and thought content normal. His mood appears anxious. He exhibits a depressed mood.  Tearful with discussion of work stress and worry over foot pains  Nursing note and vitals reviewed.  Results for orders placed or performed in visit on 12/07/15  Microalbumin / creatinine urine ratio  Result Value Ref Range   Microalb, Ur 1.0 0.0 - 1.9 mg/dL   Creatinine,U 161.0 mg/dL   Microalb Creat Ratio 0.5 0.0 - 30.0 mg/g  Lipid panel  Result Value Ref Range   Cholesterol 145 0 - 200 mg/dL   Triglycerides 960.4 (H) 0.0 - 149.0 mg/dL   HDL 54.09 (L) >81.19 mg/dL   VLDL 14.7 (H) 0.0 - 82.9 mg/dL   Total CHOL/HDL Ratio 6    NonHDL 118.87   Hemoglobin A1c  Result Value Ref Range   Hgb A1c MFr Bld 6.3 4.6 - 6.5 %  Basic metabolic panel  Result Value Ref Range   Sodium 138 135 - 145 mEq/L   Potassium 4.3 3.5 - 5.1 mEq/L   Chloride 103 96 - 112 mEq/L   CO2 27 19 - 32 mEq/L   Glucose, Bld 135 (H) 70 - 99 mg/dL   BUN 11 6 - 23 mg/dL   Creatinine, Ser 5.62 0.40 - 1.50 mg/dL   Calcium 9.4 8.4 - 13.0 mg/dL   GFR 86.57 >84.69 mL/min  Hepatic function panel  Result Value Ref Range   Total Bilirubin 0.4 0.2 - 1.2 mg/dL   Bilirubin, Direct 0.1 0.0 - 0.3 mg/dL   Alkaline Phosphatase 51 39 - 117 U/L   AST 33 0 - 37 U/L   ALT 53 0 - 53 U/L   Total  Protein 7.5 6.0 - 8.3 g/dL   Albumin 4.6 3.5 - 5.2 g/dL  LDL cholesterol, direct  Result Value Ref Range   Direct LDL 91.0 mg/dL      Assessment & Plan:   Problem List Items Addressed This Visit    Mixed hyperlipidemia    Chronic. Continue pravastatin. Discussed healthy diet changes to improve triglycerides and exercise to improve HDL      Relevant Medications   atenolol (TENORMIN) 100 MG tablet   pravastatin (PRAVACHOL) 40 MG tablet   NAFLD (nonalcoholic fatty liver disease)    LFTs normal.       HTN (hypertension)    Chronic, stable. Continue atenolol  daily. Consider ACEI      Relevant Medications   atenolol (TENORMIN) 100 MG tablet   pravastatin (PRAVACHOL) 40 MG tablet   Type 2 diabetes, controlled, with peripheral  neuropathy (HCC)    Chronic, stable. Decrease metformin to  daily due to diarrhea. Recheck levels in 3 months.       Relevant Medications   metFORMIN (GLUCOPHAGE) 850 MG tablet   gabapentin (NEURONTIN) 300 MG capsule   pravastatin (PRAVACHOL) 40 MG tablet   Other Relevant Orders   Vitamin B12   Folate   Nerve conduction test   Obesity, Class II, BMI 35-39.9, with comorbidity (HCC)    Discussed healthy diet and lifestyle changes to affect sustainable weight loss.      Relevant Medications   metFORMIN (GLUCOPHAGE) 850 MG tablet   Health maintenance examination - Primary    Preventative protocols reviewed and updated unless pt declined. Discussed healthy diet and lifestyle.       Foot pain, bilateral    Previously presumed diabetic neuropathy but describes sharp more stabbing pain today and not burning or paresthesias per se. Very bothersome to patient.  Will increase gabapentin to  in am and  in pm.  Will order nerve conduction study. Will check B12 and folate levels Again noted poor foot mechanics with flat foot - recommended he buy arch support insoles.       Relevant Orders   Nerve conduction test       Follow up  plan: Return in about 3 months (around 03/17/2016) for follow up visit.

## 2015-12-16 NOTE — Progress Notes (Signed)
Pre visit review using our clinic review tool, if applicable. No additional management support is needed unless otherwise documented below in the visit note. 

## 2015-12-16 NOTE — Assessment & Plan Note (Signed)
Chronic. Continue pravastatin. Discussed healthy diet changes to improve triglycerides and exercise to improve HDL

## 2015-12-16 NOTE — Assessment & Plan Note (Addendum)
LFTs normal

## 2015-12-16 NOTE — Assessment & Plan Note (Signed)
Previously presumed diabetic neuropathy but describes sharp more stabbing pain today and not burning or paresthesias per se. Very bothersome to patient.  Will increase gabapentin to 300mg  in am and 600mg  in pm.  Will order nerve conduction study. Will check B12 and folate levels Again noted poor foot mechanics with flat foot - recommended he buy arch support insoles.

## 2015-12-16 NOTE — Assessment & Plan Note (Signed)
Chronic, stable. Decrease metformin to 850mg  daily due to diarrhea. Recheck levels in 3 months.

## 2015-12-16 NOTE — Assessment & Plan Note (Signed)
Discussed healthy diet and lifestyle changes to affect sustainable weight loss  

## 2015-12-16 NOTE — Addendum Note (Signed)
Addended by: Baldomero LamyHAVERS, Marquail Bradwell C on: 12/16/2015 01:55 PM   Modules accepted: Kipp BroodSmartSet

## 2015-12-17 ENCOUNTER — Other Ambulatory Visit: Payer: Self-pay | Admitting: Family Medicine

## 2015-12-17 MED ORDER — VITAMIN B-12 1000 MCG PO TABS: 1000.0000 ug | ORAL_TABLET | Freq: Every day | ORAL | Status: DC

## 2015-12-21 ENCOUNTER — Telehealth: Payer: Self-pay | Admitting: Family Medicine

## 2015-12-21 DIAGNOSIS — M79671 Pain in right foot: Secondary | ICD-10-CM

## 2015-12-21 DIAGNOSIS — E1142 Type 2 diabetes mellitus with diabetic polyneuropathy: Secondary | ICD-10-CM

## 2015-12-21 DIAGNOSIS — M79672 Pain in left foot: Secondary | ICD-10-CM

## 2015-12-21 NOTE — Telephone Encounter (Signed)
This was ordered 12/16/2015 I was told by neurology we didn't need to have separate neurology referral, but I have placed one. thanks.

## 2015-12-21 NOTE — Telephone Encounter (Signed)
Please enter neurology consult for pt to have nerve conduction test.     Thanks, Revonda StandardAllison

## 2015-12-24 ENCOUNTER — Encounter: Payer: Self-pay | Admitting: Family Medicine

## 2015-12-25 ENCOUNTER — Ambulatory Visit (INDEPENDENT_AMBULATORY_CARE_PROVIDER_SITE_OTHER): Payer: 59 | Admitting: Family Medicine

## 2015-12-25 ENCOUNTER — Emergency Department (HOSPITAL_COMMUNITY)
Admission: EM | Admit: 2015-12-25 | Discharge: 2015-12-26 | Disposition: A | Discharge status: Home / Self Care | Payer: 59 | Attending: Emergency Medicine | Admitting: Emergency Medicine

## 2015-12-25 ENCOUNTER — Encounter (HOSPITAL_COMMUNITY): Payer: Self-pay | Admitting: Emergency Medicine

## 2015-12-25 ENCOUNTER — Encounter: Payer: Self-pay | Admitting: Family Medicine

## 2015-12-25 VITALS — BP 158/104 | HR 82 | Temp 97.7°F | Wt 299.0 lb

## 2015-12-25 DIAGNOSIS — R45851 Suicidal ideations: Secondary | ICD-10-CM | POA: Diagnosis present

## 2015-12-25 DIAGNOSIS — Z8719 Personal history of other diseases of the digestive system: Secondary | ICD-10-CM | POA: Diagnosis not present

## 2015-12-25 DIAGNOSIS — E669 Obesity, unspecified: Secondary | ICD-10-CM | POA: Insufficient documentation

## 2015-12-25 DIAGNOSIS — I10 Essential (primary) hypertension: Secondary | ICD-10-CM | POA: Insufficient documentation

## 2015-12-25 DIAGNOSIS — F331 Major depressive disorder, recurrent, moderate: Secondary | ICD-10-CM | POA: Diagnosis present

## 2015-12-25 DIAGNOSIS — Z8701 Personal history of pneumonia (recurrent): Secondary | ICD-10-CM | POA: Insufficient documentation

## 2015-12-25 DIAGNOSIS — E119 Type 2 diabetes mellitus without complications: Secondary | ICD-10-CM | POA: Insufficient documentation

## 2015-12-25 DIAGNOSIS — Z79899 Other long term (current) drug therapy: Secondary | ICD-10-CM | POA: Insufficient documentation

## 2015-12-25 DIAGNOSIS — E782 Mixed hyperlipidemia: Secondary | ICD-10-CM | POA: Diagnosis not present

## 2015-12-25 DIAGNOSIS — F419 Anxiety disorder, unspecified: Secondary | ICD-10-CM | POA: Insufficient documentation

## 2015-12-25 DIAGNOSIS — J45909 Unspecified asthma, uncomplicated: Secondary | ICD-10-CM | POA: Diagnosis not present

## 2015-12-25 DIAGNOSIS — Z7984 Long term (current) use of oral hypoglycemic drugs: Secondary | ICD-10-CM | POA: Insufficient documentation

## 2015-12-25 HISTORY — DX: Suicidal ideations: R45.851

## 2015-12-25 LAB — CBC
HEMATOCRIT: 47.1 % (ref 39.0–52.0)
Hemoglobin: 15.2 g/dL (ref 13.0–17.0)
MCH: 28 pg (ref 26.0–34.0)
MCHC: 32.3 g/dL (ref 30.0–36.0)
MCV: 86.9 fL (ref 78.0–100.0)
Platelets: 227 10*3/uL (ref 150–400)
RBC: 5.42 MIL/uL (ref 4.22–5.81)
RDW: 12.9 % (ref 11.5–15.5)
WBC: 6.7 10*3/uL (ref 4.0–10.5)

## 2015-12-25 LAB — RAPID URINE DRUG SCREEN, HOSP PERFORMED
Amphetamines: NOT DETECTED
BARBITURATES: NOT DETECTED
Benzodiazepines: NOT DETECTED
COCAINE: NOT DETECTED
OPIATES: NOT DETECTED
Tetrahydrocannabinol: NOT DETECTED

## 2015-12-25 LAB — COMPREHENSIVE METABOLIC PANEL
ALBUMIN: 4.8 g/dL (ref 3.5–5.0)
ALT: 57 U/L (ref 17–63)
AST: 42 U/L — AB (ref 15–41)
Alkaline Phosphatase: 56 U/L (ref 38–126)
Anion gap: 14 (ref 5–15)
BILIRUBIN TOTAL: 1.1 mg/dL (ref 0.3–1.2)
BUN: 12 mg/dL (ref 6–20)
CHLORIDE: 103 mmol/L (ref 101–111)
CO2: 21 mmol/L — ABNORMAL LOW (ref 22–32)
CREATININE: 0.98 mg/dL (ref 0.61–1.24)
Calcium: 9.5 mg/dL (ref 8.9–10.3)
GFR calc Af Amer: >60 mL/min (ref >60)
GLUCOSE: 144 mg/dL — AB (ref 65–99)
POTASSIUM: 4.1 mmol/L (ref 3.5–5.1)
Sodium: 138 mmol/L (ref 135–145)
Total Protein: 8.3 g/dL — ABNORMAL HIGH (ref 6.5–8.1)

## 2015-12-25 LAB — ETHANOL

## 2015-12-25 LAB — SALICYLATE LEVEL: Salicylate Lvl: <4 mg/dL (ref 2.8–30.0)

## 2015-12-25 LAB — ACETAMINOPHEN LEVEL: Acetaminophen (Tylenol), Serum: <10 ug/mL — ABNORMAL LOW (ref 10–30)

## 2015-12-25 MED ORDER — ATENOLOL 100 MG PO TABS
100.0000 mg | ORAL_TABLET | Freq: Every day | ORAL | Status: DC
  Administered 2015-12-26: 100 mg via ORAL
  Filled 2015-12-25: qty 1

## 2015-12-25 MED ORDER — NAPROXEN SODIUM 220 MG PO TABS: 220.0000 mg | ORAL_TABLET | Freq: Two times a day (BID) | ORAL | Status: DC | PRN

## 2015-12-25 MED ORDER — METFORMIN HCL 850 MG PO TABS
850.0000 mg | ORAL_TABLET | Freq: Two times a day (BID) | ORAL | Status: DC
  Administered 2015-12-25 – 2015-12-26 (×2): 850 mg via ORAL
  Filled 2015-12-25 (×5): qty 1

## 2015-12-25 MED ORDER — NAPROXEN 500 MG PO TABS: 250.0000 mg | ORAL_TABLET | Freq: Two times a day (BID) | ORAL | Status: DC | PRN

## 2015-12-25 MED ORDER — GABAPENTIN 300 MG PO CAPS: 300.0000 mg | ORAL_CAPSULE | ORAL | Status: DC

## 2015-12-25 MED ORDER — PRAVASTATIN SODIUM 40 MG PO TABS
40.0000 mg | ORAL_TABLET | Freq: Every day | ORAL | Status: DC
  Administered 2015-12-25: 40 mg via ORAL
  Filled 2015-12-25 (×2): qty 1
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
## 2015-12-25 NOTE — Progress Notes (Signed)
Pre visit review using our clinic review tool, if applicable. No additional management support is needed unless otherwise documented below in the visit note. 

## 2015-12-25 NOTE — ED Provider Notes (Signed)
CSN: [id number]     Arrival date & time 12/25/15  0845 History   First MD Initiated Contact with Patient 12/25/15 [phone]     Chief Complaint  Patient presents with  . Suicidal     (Consider location/radiation/quality/duration/timing/severity/associated sxs/prior Treatment) Patient is a 35 y.o. male presenting with mental health disorder. The history is provided by the patient.  Mental Health Problem Presenting symptoms: depression (months) and suicidal thoughts (with plan to hang himself)   Presenting symptoms: no suicidal threats and no suicide attempt   Presenting symptoms comment:  Angry, outbursts at work, feels bad about diabetes Patient accompanied by:  Family member (mother accompanied) Degree of incapacity (severity):  Moderate Onset quality:  Gradual Duration: today with suicidality. Timing:  Constant Progression:  Worsening Chronicity:  New Context: stressful life event   Treatment compliance:  Untreated Relieved by:  Nothing Worsened by:  Nothing tried Ineffective treatments:  None tried Associated symptoms: anxiety, fatigue and irritability   Risk factors: no hx of mental illness     Past Medical History  Diagnosis Date  . HTN (hypertension)   . Mixed hyperlipidemia   . Diabetes type 2, controlled (HCC)   . Obesity   . History of asthma childhood    dust mites  . NAFLD (nonalcoholic fatty liver disease)   . History of pneumonia 09/2014    LLL   History reviewed. No pertinent past surgical history. Family History  Problem Relation Age of Onset  . Diabetes Paternal Grandfather   . Diabetes Mother   . Diabetes Father   . Hypertension Mother   . Hypertension Father   . Cancer Maternal Grandmother     breast  . CAD Father     53  . Stroke Neg Hx   . Bipolar disorder Maternal Aunt   . Suicidality Maternal Aunt    Social History  Substance Use Topics  . Smoking status: Never Smoker   . Smokeless tobacco: Never Used  . Alcohol Use: 0.0 oz/week    0  Standard drinks or equivalent per week     Comment: rare    Review of Systems  Constitutional: Positive for irritability and fatigue.  Psychiatric/Behavioral: Positive for suicidal ideas (with plan to hang himself). The patient is nervous/anxious.   All other systems reviewed and are negative.     Allergies  Review of patient's allergies indicates no known allergies.  Home Medications   Prior to Admission medications   Medication Sig Start Date End Date Taking? Authorizing Provider  atenolol (TENORMIN) 100 MG tablet Take 1 tablet (100 mg total) by mouth daily. 12/16/15  Yes [name], MD  Cyanocobalamin (B-12) 3000 MCG CAPS Take 3,000 mcg by mouth daily.   Yes Historical Provider, MD  gabapentin (NEURONTIN) 300 MG capsule Take 1 capsule (300 mg total) by mouth as directed. 1 in the morning and 2 at night 12/16/15  Yes [name], MD  metFORMIN (GLUCOPHAGE) 850 MG tablet Take 1 tablet (850 mg total) by mouth 2 (two) times daily with a meal. 12/16/15  Yes [name], MD  naproxen sodium (ANAPROX) 220 MG tablet Take 220-440 mg by mouth 2 (two) times daily as needed (pain).   Yes Historical Provider, MD  Omega-3 Fatty Acids (FISH OIL PO) Take 2 capsules by mouth daily.   Yes Historical Provider, MD  OVER THE COUNTER MEDICATION Take 1 tablet by mouth daily. Calm aid   Yes Historical Provider, MD  pravastatin (PRAVACHOL) 40 MG tablet Take 1 tablet (40  mg total) by mouth at bedtime. 12/16/15  Yes Eustaquio BoydenJavier Gutierrez, MD  glucose blood (BAYER CONTOUR NEXT TEST) test strip Use as instructed 07/22/13   Michele McalpineScott M Nadel, MD   BP 136/89 mmHg  Pulse 68  Temp(Src) 97.8 F (36.6 C)  Resp 16  SpO2 97% Physical Exam  Constitutional: He is oriented to person, place, and time. He appears well-developed and well-nourished. No distress.  HENT:  Head: Normocephalic and atraumatic.  Eyes: Conjunctivae are normal.  Neck: Neck supple. No tracheal deviation present.  Cardiovascular: Normal  rate and regular rhythm.   Pulmonary/Chest: Effort normal. No respiratory distress.  Abdominal: Soft. He exhibits no distension.  Neurological: He is alert and oriented to person, place, and time.  Skin: Skin is warm and dry.  Psychiatric: He is slowed and withdrawn. He exhibits a depressed mood. He expresses suicidal ideation. He expresses suicidal plans (hanging).    ED Course  Procedures (including critical care time) Labs Review Labs Reviewed  COMPREHENSIVE METABOLIC PANEL - Abnormal; Notable for the following:    CO2 21 (*)    Glucose, Bld 144 (*)    Total Protein 8.3 (*)    AST 42 (*)    All other components within normal limits  ACETAMINOPHEN LEVEL - Abnormal; Notable for the following:    Acetaminophen (Tylenol), Serum <10 (*)    All other components within normal limits  ETHANOL  SALICYLATE LEVEL  CBC  URINE RAPID DRUG SCREEN, HOSP PERFORMED    Imaging Review No results found. I have personally reviewed and evaluated these images and lab results as part of my medical decision-making.   EKG Interpretation None      MDM   Final diagnoses:  Depression    35 y.o. male presents with Suicidal ideation starting this morning, has had ongoing depression for months relating to job stressors and chronic health problems including diabetes and obesity. She has consulted to evaluate for safety. Patient here voluntarily. MEDICALLY CLEAR FOR TRANSFER OR PSYCHIATRIC ADMISSION.   Psychiatry agreed to reassess the patient and the morning after monitoring and patient agree to stay voluntarily.  Lyndal Pulleyaniel Mckenlee Mangham, MD 12/25/15 [phone]

## 2015-12-25 NOTE — Assessment & Plan Note (Signed)
Active suicidal thoughts. Mother on her way here. [facility]. May need behavioral health hospitalization. Will return for f/u next week.

## 2015-12-25 NOTE — ED Notes (Signed)
Patient noted sleeping in room. No complaints, stable, in no acute distress. Q15 minute rounds and monitoring via Security Cameras to continue.  

## 2015-12-25 NOTE — Patient Instructions (Addendum)
To [facility] for further evaluation this morning.  Return next week for follow up with me.

## 2015-12-25 NOTE — ED Notes (Signed)
[id number] Expected date:  Expected time:  Means of arrival:  Comments: HOLD FOR 16

## 2015-12-25 NOTE — ED Notes (Signed)
Patient noted in room. No complaints, stable, in no acute distress. Q15 minute rounds and monitoring via Security Cameras to continue.  

## 2015-12-25 NOTE — ED Notes (Signed)
Pt oriented to room and unit.  Pt denies S/I and H/I.  15 minute checks and video monitoring in place.

## 2015-12-25 NOTE — ED Notes (Signed)
Patient c/o suicidal ideation x several years with no specific plans but vague thoughts of hanging self by neck, no HI/AVH. Denies any recent attempts at self harm, denies any recent loss in support system. Patient seen by psychiatrist this morning, note in EPIC, sent here for SI. Patient tearful, calm, accompanied by mother.

## 2015-12-25 NOTE — ED Notes (Signed)
Report received from Edie Marvin RN. Patient alert and oriented, warm and dry, in no acute distress. Patient denies SI, HI, AVH and pain. Patient made aware of Q15 minute rounds and security cameras for their safety. Patient instructed to come to me with needs or concerns. 

## 2015-12-25 NOTE — Progress Notes (Signed)
   BP 158/104 mmHg  Pulse 82  Temp(Src) 97.7 F (36.5 C) (Oral)  Wt 299 lb (135.626 kg)  SpO2 98%   CC: suicidal thoughts  Subjective:    Patient ID: James [name], male    DOB: Jan 23, 1981, 35 y.o.   MRN: [id number]  HPI: James [name] is a 35 y.o. male presenting on 12/25/2015 for Suicidal thoughts   Presents today as walk in with suicidal thoughts. We did recently increase gabapentin to 300/600mg . Pending NCS for foot pain from presumed diabetic neuropathy.   Increased stress at work. Argument this morning with father. Told GF he wanted to hang himself so his father would care. Started having suicidal ideations last night. Continues to have active suicidal thoughts.   Stress relieving strategies - walks around home, at work, takes breaks. Has not been doing this lately.   Endorses longstanding issues with insecurity and some anxiety but no frank depression.   Relevant past medical, surgical, family and social history reviewed and updated as indicated. Interim medical history since our last visit reviewed. Allergies and medications reviewed and updated. Current Outpatient Prescriptions on File Prior to Visit  Medication Sig  . atenolol (TENORMIN) 100 MG tablet Take 1 tablet (100 mg total) by mouth daily.  Marland Kitchen. gabapentin (NEURONTIN) 300 MG capsule Take 1 capsule (300 mg total) by mouth as directed. 1 in the morning and 2 at night  . glucose blood (BAYER CONTOUR NEXT TEST) test strip Use as instructed  . metFORMIN (GLUCOPHAGE) 850 MG tablet Take 1 tablet (850 mg total) by mouth 2 (two) times daily with a meal.  . pravastatin (PRAVACHOL) 40 MG tablet Take 1 tablet (40 mg total) by mouth at bedtime.  . vitamin B-12 (CYANOCOBALAMIN) 1000 MCG tablet Take 1 tablet (1,000 mcg total) by mouth daily.  . [DISCONTINUED] metoprolol (TOPROL-XL) 50 MG 24 hr tablet Take 50 mg by mouth daily.     No current facility-administered medications on file prior to visit.    Review of Systems Per  HPI unless specifically indicated in ROS section     Objective:    BP 158/104 mmHg  Pulse 82  Temp(Src) 97.7 F (36.5 C) (Oral)  Wt 299 lb (135.626 kg)  SpO2 98%  Wt Readings from Last 3 Encounters:  12/25/15 299 lb (135.626 kg)  12/16/15 293 lb (132.904 kg)  10/14/15 301 lb 8 oz (136.76 kg)    Physical Exam  Constitutional: He appears well-developed and well-nourished. No distress.  Psychiatric: His speech is normal. His mood appears anxious. He is agitated. He is not aggressive, not hyperactive and not combative. He exhibits a depressed mood. He expresses suicidal ideation. He expresses suicidal plans.  tearful  Nursing note and vitals reviewed.  Results for orders placed or performed in visit on 12/16/15  Vitamin B12  Result Value Ref Range   Vitamin B-12 219 211 - 911 pg/mL  Folate  Result Value Ref Range   Folate 15.7 >5.9 ng/mL      Assessment & Plan:   Problem List Items Addressed This Visit    Suicidal thoughts - Primary    Active suicidal thoughts. Mother on her way here. [facility]. May need behavioral health hospitalization. Will return for f/u next week.           Follow up plan: No Follow-up on file.  Eustaquio BoydenJavier Graysen Woodyard, MD

## 2015-12-26 ENCOUNTER — Encounter: Payer: Self-pay | Admitting: Family Medicine

## 2015-12-26 DIAGNOSIS — F331 Major depressive disorder, recurrent, moderate: Secondary | ICD-10-CM | POA: Diagnosis not present

## 2015-12-26 HISTORY — DX: Major depressive disorder, recurrent, moderate: F33.1

## 2015-12-26 LAB — CBG MONITORING, ED: Glucose-Capillary: 126 mg/dL — ABNORMAL HIGH (ref 65–99)

## 2015-12-26 NOTE — ED Notes (Signed)
Mother at bedside.  Pt reports that he feels that he can go home and is safe.  Pt does not want to stay for admission.

## 2015-12-26 NOTE — BH Assessment (Signed)
Spoke with patient who states that he feels that he can discharge safely to his mothers home.  Patient agrees to follow up with MH-IOP and was provided the telephone number and address. Patient signed a release for the IOP program and this writer sent a referral. Patients mother, Eunice BlaseDebbie, agrees for the patient to be discharged to her care. Patient provided with information for mobile crisis to use in the case of a mental health emergency.  Patient denies questions or concerns at this time.   Davina PokeJoVea Brent Noto, LCSW Therapeutic Triage Specialist [facility] 12/26/2015 12:39 PM

## 2015-12-26 NOTE — ED Notes (Signed)
Patient noted sleeping in room. No complaints, stable, in no acute distress. Q15 minute rounds and monitoring via Security Cameras to continue.  

## 2015-12-26 NOTE — ED Notes (Signed)
TTS into see 

## 2015-12-26 NOTE — ED Notes (Signed)
Dr A into see 

## 2015-12-26 NOTE — ED Notes (Signed)
Up to the bathroom 

## 2015-12-26 NOTE — ED Notes (Signed)
Patient noted in room. No complaints, stable, in no acute distress. Q15 minute rounds and monitoring via [employer] Cam[employer] to continue. Ice water given.

## 2015-12-26 NOTE — [facility] Suicide Risk Assessment (Signed)
Suicide Risk Assessment  Discharge Assessment   [facility]   Principal Problem: Major depressive disorder, recurrent episode, moderate (HCC) Discharge Diagnoses:  Patient Active Problem List   Diagnosis Date Noted  . Major depressive disorder, recurrent episode, moderate (HCC) [F33.1] 12/26/2015    Priority: High  . Suicidal thoughts [R45.851] 12/25/2015  . Foot pain, bilateral [id number][M79.671, M79.672] 02/03/2015  . Health maintenance examination [Z00.00] 12/09/2014  . HTN (hypertension) [I10]   . Type 2 diabetes, controlled, with peripheral neuropathy (HCC) [E11.42]   . Obesity, Class II, BMI 35-39.9, with comorbidity (HCC) [E66.01]   . NAFLD (nonalcoholic fatty liver disease) [Z61.0][K76.0] 04/17/2013  . Mixed hyperlipidemia [E78.2] 11/20/2010    Total Time spent with patient: 45 minutes  Musculoskeletal: Strength & Muscle Tone: within normal limits Gait & Station: normal Patient leans: N/A  Psychiatric Specialty Exam: Review of Systems  Constitutional: Negative.   HENT: Negative.   Eyes: Negative.   Respiratory: Negative.   Cardiovascular: Negative.   Gastrointestinal: Negative.   Genitourinary: Negative.   Musculoskeletal: Negative.   Skin: Negative.   Neurological: Negative.   Endo/Heme/Allergies: Negative.   Psychiatric/Behavioral: Positive for depression.    Blood pressure 137/74, pulse 92, temperature 98.6 F (37 C), temperature source Oral, resp. rate 18, SpO2 96 %.There is no weight on file to calculate BMI.  General Appearance: Disheveled  Eye Contact::  Good  Speech:  Normal Rate  Volume:  Normal  Mood:  Depressed  Affect:  Congruent  Thought Process:  Coherent  Orientation:  Full (Time, Place, and Person)  Thought Content:  Rumination  Suicidal Thoughts:  No  Homicidal Thoughts:  No  Memory:  Immediate;   Good Recent;   Good Remote;   Good  Judgement:  Fair  Insight:  Fair  Psychomotor Activity:  Normal  Concentration:  Fair   Recall:  Good  Fund of Knowledge:Good  Language: Good  Akathisia:  No  Handed:  Right  AIMS (if indicated):     Assets:  Communication Skills Desire for Improvement Financial Resources/Insurance Housing Intimacy Leisure Time Physical Health Resilience Social Support Talents/Skills Transportation Vocational/Educational  ADL's:  Intact  Cognition: WNL  Sleep:      Mental Status Per Nursing Assessment::   On Admission:   Depression, passive suicidal ideations  Demographic Factors:  Male and Caucasian  Loss Factors: NA  Historical Factors: NA  Risk Reduction Factors:   Sense of responsibility to family, Employed, Positive social support and Positive coping skills or problem solving skills  Continued Clinical Symptoms:  Depression, moderate level  Cognitive Features That Contribute To Risk:  None    Suicide Risk:  Minimal: No identifiable suicidal ideation.  Patients presenting with no risk factors but with morbid ruminations; may be classified as minimal risk based on the severity of the depressive symptoms    Plan Of Care/Follow-up recommendations:  Activity:  as tolerated  Diet:  heart healthy, diabetic diet  LORD, JAMISON, NP 12/26/2015, 12:18 PM

## 2015-12-26 NOTE — ED Notes (Signed)
Dr A into talk w/ pt, his mom and TTS

## 2015-12-26 NOTE — Consult Note (Signed)
[facility] Psychiatry Consult   Reason for Consult:  Depression Referring Physician:  EDP Patient Identification: James [name] MRN:  [id number] Principal Diagnosis: Major depressive disorder, recurrent episode, moderate ([place]) Diagnosis:   Patient Active Problem List   Diagnosis Date Noted  . Major depressive disorder, recurrent episode, moderate ([employer]) [F33.1] 12/26/2015    Priority: High  . Suicidal thoughts [R45.851] 12/25/2015  . Foot pain, bilateral [id number], M79.672] 02/03/2015  . Health maintenance examination [Z00.00] 12/09/2014  . HTN (hypertension) [I10]   . Type 2 diabetes, controlled, with peripheral neuropathy ([place]) [E11.42]   . Obesity, Class II, BMI 35-39.9, with comorbidity ([place]) [E66.01]   . NAFLD (nonalcoholic fatty liver disease) [K76.0] 04/17/2013  . Mixed hyperlipidemia [E78.2] 11/20/2010    Total Time spent with patient: 45 minutes  Subjective:   James [name] is a 35 y.o. male patient does not warrant admission.  HPI:  35 yo male who has an increase in depression with some passive suicidal ideations, past history of depression.  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  James [name] is upset that he tries to talk to his parents but "they do not understand or try to understand me."  Denies suicidal/homicidal ideations today along with hallucinations and alcohol/drug abuse.  He reports being bullied in middle school and feeling isolated with these feelings returning.  His parents came to the ED and want to take him home, agreeable to intensive outpatient at [facility].     Past Psychiatric History: Depression  Risk to Self: Suicidal Ideation: No (passive ideations this morning) Suicidal Intent: No Is patient at risk for suicide?: Yes Suicidal Plan?: No Access to Means: No What has been your use of drugs/alcohol within the last 12 months?: Denies How many times?: 0 Other Self Harm Risks: Denies Triggers for Past  Attempts: None known Intentional Self Injurious Behavior: None Risk to Others: Homicidal Ideation: No Thoughts of Harm to Others: No Current Homicidal Intent: No Current Homicidal Plan: No Access to Homicidal Means: No Identified Victim: Denies History of harm to others?: No Assessment of Violence: None Noted Violent Behavior Description: Denies Does patient have access to weapons?: No Criminal Charges Pending?: No Does patient have a court date: No Prior Inpatient Therapy: Prior Inpatient Therapy: No Prior Therapy Dates: N/A Prior Therapy Facilty/Provider(s): N/A Reason for Treatment: N/A Prior Outpatient Therapy: Prior Outpatient Therapy: No Prior Therapy Dates: N/A Prior Therapy Facilty/Provider(s): N/A Reason for Treatment: N/A Does patient have an ACCT team?: No Does patient have Intensive In-House Services?  : No Does patient have [facility] services? : No Does patient have P4CC services?: No  Past Medical History:  Past Medical History  Diagnosis Date  . HTN (hypertension)   . Mixed hyperlipidemia   . Diabetes type 2, controlled ([place])   . Obesity   . History of asthma childhood    dust mites  . NAFLD (nonalcoholic fatty liver disease)   . History of pneumonia 09/2014    LLL   History reviewed. No pertinent past surgical history. Family History:  Family History  Problem Relation Age of Onset  . Diabetes Paternal Grandfather   . Diabetes Mother   . Diabetes Father   . Hypertension Mother   . Hypertension Father   . Cancer Maternal Grandmother     breast  . CAD Father     39  . Stroke Neg Hx   . Bipolar disorder Maternal Aunt   . Suicidality Maternal  Aunt    Family Psychiatric  History: None Social History:  History  Alcohol Use  . 0.0 oz/week  . 0 Standard drinks or equivalent per week    Comment: rare     History  Drug Use No    Social History   Social History  . Marital Status: Single    Spouse Name: N/A  . Number of Children: 0  . Years of  Education: N/A   Social History Main Topics  . Smoking status: Never Smoker   . Smokeless tobacco: Never Used  . Alcohol Use: 0.0 oz/week    0 Standard drinks or equivalent per week     Comment: rare  . Drug Use: No  . Sexual Activity: Yes   Other Topics Concern  . 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   Edu: AA   Activity: active weekly - 30 min/day on treadmill   Diet: good water, fruits/vegetables daily, avoids fast foods   Additional Social History:    Allergies:  No Known Allergies  Labs:  Results for orders placed or performed during the hospital encounter of 12/25/15 (from the past 48 hour(s))  Comprehensive metabolic panel     Status: Abnormal   Collection Time: 12/25/15  9:17 AM  Result Value Ref Range   Sodium 138 135 - 145 mmol/L   Potassium 4.1 3.5 - 5.1 mmol/L   Chloride 103 101 - 111 mmol/L   CO2 21 (L) 22 - 32 mmol/L   Glucose, Bld 144 (H) 65 - 99 mg/dL   BUN 12 6 - 20 mg/dL   Creatinine, Ser 0.98 0.61 - 1.24 mg/dL   Calcium 9.5 8.9 - 10.3 mg/dL   Total Protein 8.3 (H) 6.5 - 8.1 g/dL   Albumin 4.8 3.5 - 5.0 g/dL   AST 42 (H) 15 - 41 U/L   ALT 57 17 - 63 U/L   Alkaline Phosphatase 56 38 - 126 U/L   Total Bilirubin 1.1 0.3 - 1.2 mg/dL   GFR calc non Af Amer >60 >60 mL/min   GFR calc Af Amer >60 >60 mL/min    Comment: (NOTE) The eGFR has been calculated using the CKD EPI equation. This calculation has not been validated in all clinical situations. eGFR's persistently <60 mL/min signify possible Chronic Kidney Disease.    Anion gap 14 5 - 15  Ethanol (ETOH)     Status: None   Collection Time: 12/25/15  9:17 AM  Result Value Ref Range   Alcohol, Ethyl (B) <5 <5 mg/dL    Comment:        LOWEST DETECTABLE LIMIT FOR SERUM ALCOHOL IS 5 mg/dL FOR MEDICAL PURPOSES ONLY   Salicylate level     Status: None   Collection Time: 12/25/15  9:17 AM  Result Value Ref Range   Salicylate Lvl <0.9 2.8 -  30.0 mg/dL  Acetaminophen level     Status: Abnormal   Collection Time: 12/25/15  9:17 AM  Result Value Ref Range   Acetaminophen (Tylenol), Serum <10 (L) 10 - 30 ug/mL    Comment:        THERAPEUTIC CONCENTRATIONS VARY SIGNIFICANTLY. A RANGE OF 10-30 ug/mL MAY BE AN EFFECTIVE CONCENTRATION FOR MANY PATIENTS. HOWEVER, SOME ARE BEST TREATED AT CONCENTRATIONS OUTSIDE THIS RANGE. ACETAMINOPHEN CONCENTRATIONS >150 ug/mL AT 4 HOURS AFTER INGESTION AND >50 ug/mL AT 12 HOURS AFTER INGESTION ARE OFTEN ASSOCIATED WITH TOXIC REACTIONS.   CBC  Status: None   Collection Time: 12/25/15  9:17 AM  Result Value Ref Range   WBC 6.7 4.0 - 10.5 K/uL   RBC 5.42 4.22 - 5.81 MIL/uL   Hemoglobin 15.2 13.0 - 17.0 g/dL   HCT 47.1 39.0 - 52.0 %   MCV 86.9 78.0 - 100.0 fL   MCH 28.0 26.0 - 34.0 pg   MCHC 32.3 30.0 - 36.0 g/dL   RDW 12.9 11.5 - 15.5 %   Platelets 227 150 - 400 K/uL  Urine rapid drug screen (hosp performed) (Not at [facility])     Status: None   Collection Time: 12/25/15  9:24 AM  Result Value Ref Range   Opiates NONE DETECTED NONE DETECTED   Cocaine NONE DETECTED NONE DETECTED   Benzodiazepines NONE DETECTED NONE DETECTED   Amphetamines NONE DETECTED NONE DETECTED   Tetrahydrocannabinol NONE DETECTED NONE DETECTED   Barbiturates NONE DETECTED NONE DETECTED    Comment:        DRUG SCREEN FOR MEDICAL PURPOSES ONLY.  IF CONFIRMATION IS NEEDED FOR ANY PURPOSE, NOTIFY LAB WITHIN 5 DAYS.        LOWEST DETECTABLE LIMITS FOR URINE DRUG SCREEN Drug Class       Cutoff (ng/mL) Amphetamine      1000 Barbiturate      200 Benzodiazepine   937 Tricyclics       169 Opiates          300 Cocaine          300 THC              50   CBG monitoring, ED     Status: Abnormal   Collection Time: 12/26/15  7:47 AM  Result Value Ref Range   Glucose-Capillary 126 (H) 65 - 99 mg/dL    Current Facility-Administered Medications  Medication Dose Route Frequency Provider Last Rate Last Dose  .  atenolol (TENORMIN) tablet 100 mg  100 mg Oral Daily Leo Grosser, MD   100 mg at 12/26/15 1143  . gabapentin (NEURONTIN) capsule 300 mg  300 mg Oral UD Leo Grosser, MD      . metFORMIN (GLUCOPHAGE) tablet 850 mg  850 mg Oral BID WC Leo Grosser, MD   850 mg at 12/26/15 6789  . naproxen (NAPROSYN) tablet 250-500 mg  250-500 mg Oral BID PRN Leo Grosser, MD      . pravastatin (PRAVACHOL) tablet 40 mg  40 mg Oral QHS Leo Grosser, MD   40 mg at 12/25/15 2109   Current Outpatient Prescriptions  Medication Sig Dispense Refill  . atenolol (TENORMIN) 100 MG tablet Take 1 tablet (100 mg total) by mouth daily. 90 tablet 3  . Cyanocobalamin (B-12) 3000 MCG CAPS Take 3,000 mcg by mouth daily.    Marland Kitchen gabapentin (NEURONTIN) 300 MG capsule Take 1 capsule (300 mg total) by mouth as directed. 1 in the morning and 2 at night 270 capsule 3  . metFORMIN (GLUCOPHAGE) 850 MG tablet Take 1 tablet (850 mg total) by mouth 2 (two) times daily with a meal. 60 tablet 11  . naproxen sodium (ANAPROX) 220 MG tablet Take 220-440 mg by mouth 2 (two) times daily as needed (pain).    . Omega-3 Fatty Acids (FISH OIL PO) Take 2 capsules by mouth daily.    Marland Kitchen OVER THE COUNTER MEDICATION Take 1 tablet by mouth daily. Calm aid    . pravastatin (PRAVACHOL) 40 MG tablet Take 1 tablet (40 mg total) by mouth at bedtime. [place]  tablet 3  . glucose blood (BAYER CONTOUR NEXT TEST) test strip Use as instructed 100 each 12  . [DISCONTINUED] metoprolol (TOPROL-XL) 50 MG 24 hr tablet Take 50 mg by mouth daily.        Musculoskeletal: Strength & Muscle Tone: within normal limits Gait & Station: normal Patient leans: N/A  Psychiatric Specialty Exam: Review of Systems  Constitutional: Negative.   HENT: Negative.   Eyes: Negative.   Respiratory: Negative.   Cardiovascular: Negative.   Gastrointestinal: Negative.   Genitourinary: Negative.   Musculoskeletal: Negative.   Skin: Negative.   Neurological: Negative.   Endo/Heme/Allergies:  Negative.   Psychiatric/Behavioral: Positive for depression.    Blood pressure 137/74, pulse 92, temperature 98.6 F (37 C), temperature source Oral, resp. rate 18, SpO2 96 %.There is no weight on file to calculate BMI.  General Appearance: Disheveled  Eye Contact::  Good  Speech:  Normal Rate  Volume:  Normal  Mood:  Depressed  Affect:  Congruent  Thought Process:  Coherent  Orientation:  Full (Time, Place, and Person)  Thought Content:  Rumination  Suicidal Thoughts:  No  Homicidal Thoughts:  No  Memory:  Immediate;   Good Recent;   Good Remote;   Good  Judgement:  Fair  Insight:  Fair  Psychomotor Activity:  Normal  Concentration:  Fair  Recall:  Good  Fund of Knowledge:Good  Language: Good  Akathisia:  No  Handed:  Right  AIMS (if indicated):     Assets:  Communication Skills Desire for Improvement Financial Resources/Insurance Housing Intimacy Leisure Time Physical Health Resilience Social Support Talents/Skills Transportation Vocational/Educational  ADL's:  Intact  Cognition: WNL  Sleep:      Treatment Plan Summary: Daily contact with patient to assess and evaluate symptoms and progress in treatment, Medication management and Plan major depressive disorder, recurrent, moderate:  -Crisis stabilization -Medication management:  Continue medical medications -Referred to Int[facility] Outpatient at [facility] where medications can be started -Individual and family counseling  Disposition: No evidence of imminent risk to self or others at present.    Waylan Boga, NP 12/26/2015 12:09 PM Patient seen face-to-face for psychiatric evaluation, chart reviewed and case discussed with the physician extender and developed treatment plan. Reviewed the information documented and agree with the treatment plan. Corena Pilgrim, MD

## 2015-12-26 NOTE — Discharge Instructions (Signed)
Patient discharged to mother. [facility] phone Monday after 8:00AM - at [phone]. Referral has been sent as well and MH-IOP will contact patient at [phone] to schedule an appointment.

## 2015-12-26 NOTE — ED Notes (Addendum)
Written dc instructions reviewed w/ pt.  Pt encouraged to follow up Monday with the IOP program, and to seek treatment for return of any suicidal thoughts or urges.  Pt verbalized understanding and agreed to do so. Pt given information for mobile crisis and encouraged to contact them if needed.  Pt denies SI at this time.  Pt ambulatory w/o difficulty to waiting area, and dc'd home with his mother.

## 2015-12-28 ENCOUNTER — Encounter: Payer: Self-pay | Admitting: *Deleted

## 2015-12-28 ENCOUNTER — Ambulatory Visit: Payer: 59 | Admitting: Primary Care

## 2015-12-28 ENCOUNTER — Encounter: Payer: Self-pay | Admitting: Family Medicine

## 2015-12-28 NOTE — Telephone Encounter (Signed)
Ok to extend note to 4/17 - see MyChart message above.

## 2015-12-28 NOTE — Telephone Encounter (Signed)
plz provide letter for work. Letter may say he was seen at our office on Friday and I believe he needs excuse through Monday - can you call and verify with patient? thanks

## 2015-12-28 NOTE — Telephone Encounter (Signed)
Letter printed and given to patient

## 2016-01-04 ENCOUNTER — Encounter (HOSPITAL_COMMUNITY): Payer: Self-pay | Admitting: Psychiatry

## 2016-01-04 ENCOUNTER — Other Ambulatory Visit (HOSPITAL_COMMUNITY): Payer: 59 | Admitting: Psychiatry

## 2016-01-04 NOTE — Progress Notes (Signed)
    Daily Group Progress Note  Program: IOP  Group Time: 9:00-10:30  Participation Level: Active  Behavioral Response: Appropriate  Type of Therapy:  Group Therapy  Summary of Progress: Pt. Completed intake assessment with the case manager. Pt. Presented as talkative, cooperative. Pt. Shared with the group that he suffers with more anxiety than depression.     Group Time: 10:30-12:00  Participation Level:  Active  Behavioral Response: Appropriate  Type of Therapy: Psycho-education Group  Summary of Progress: Pt. Left group and checked in with the case manager, deciding not to complete the program.  Shaune PollackBrown, Jennifer B, LPC

## 2016-01-05 ENCOUNTER — Ambulatory Visit (HOSPITAL_COMMUNITY): Payer: Self-pay

## 2016-01-05 ENCOUNTER — Encounter: Payer: 59 | Admitting: Neurology

## 2016-01-06 ENCOUNTER — Other Ambulatory Visit: Payer: Self-pay | Admitting: Family Medicine

## 2016-01-06 ENCOUNTER — Ambulatory Visit (HOSPITAL_COMMUNITY): Payer: Self-pay

## 2016-01-07 ENCOUNTER — Ambulatory Visit (HOSPITAL_COMMUNITY): Payer: Self-pay | Admitting: Psychiatry

## 2016-01-08 ENCOUNTER — Ambulatory Visit (HOSPITAL_COMMUNITY): Payer: Self-pay

## 2016-01-11 ENCOUNTER — Ambulatory Visit (HOSPITAL_COMMUNITY): Payer: Self-pay

## 2016-01-12 ENCOUNTER — Ambulatory Visit (HOSPITAL_COMMUNITY): Payer: Self-pay

## 2016-01-13 ENCOUNTER — Ambulatory Visit (HOSPITAL_COMMUNITY): Payer: Self-pay

## 2016-01-14 ENCOUNTER — Ambulatory Visit (HOSPITAL_COMMUNITY): Payer: Self-pay

## 2016-01-15 ENCOUNTER — Ambulatory Visit (HOSPITAL_COMMUNITY): Payer: Self-pay

## 2016-01-18 ENCOUNTER — Ambulatory Visit (HOSPITAL_COMMUNITY): Payer: Self-pay

## 2016-01-18 LAB — HM DIABETES EYE EXAM

## 2016-01-19 ENCOUNTER — Encounter: Payer: 59 | Admitting: Neurology

## 2016-01-20 ENCOUNTER — Ambulatory Visit (HOSPITAL_COMMUNITY): Payer: Self-pay

## 2016-01-21 ENCOUNTER — Ambulatory Visit (HOSPITAL_COMMUNITY): Payer: Self-pay

## 2016-01-21 ENCOUNTER — Telehealth: Payer: Self-pay | Admitting: Family Medicine

## 2016-01-21 NOTE — Telephone Encounter (Signed)
Pt called stating he was faxing fmla paperwork He was out of work from 12/25/15 to 01/04/16  Went back to work on 01/05/16 He would need intermittent for dr appointments He hasn't sent those appointment up yet

## 2016-01-21 NOTE — Telephone Encounter (Signed)
FMLA paperwork In Dr Reece AgarG IN BOX For review and signature

## 2016-01-22 ENCOUNTER — Ambulatory Visit (HOSPITAL_COMMUNITY): Payer: Self-pay

## 2016-01-22 NOTE — Telephone Encounter (Signed)
Filled and in Kim's box. 

## 2016-01-25 ENCOUNTER — Ambulatory Visit (HOSPITAL_COMMUNITY): Payer: Self-pay

## 2016-01-25 NOTE — Telephone Encounter (Signed)
Forms faxed

## 2016-02-05 ENCOUNTER — Ambulatory Visit (INDEPENDENT_AMBULATORY_CARE_PROVIDER_SITE_OTHER): Payer: 59 | Admitting: Family Medicine

## 2016-02-05 ENCOUNTER — Encounter: Payer: Self-pay | Admitting: Family Medicine

## 2016-02-05 VITALS — BP 132/78 | HR 80 | Temp 98.0°F | Wt 303.5 lb

## 2016-02-05 DIAGNOSIS — F331 Major depressive disorder, recurrent, moderate: Secondary | ICD-10-CM | POA: Diagnosis not present

## 2016-02-05 NOTE — Progress Notes (Signed)
BP 132/78 mmHg  Pulse 80  Temp(Src) 98 F (36.7 C) (Oral)  Wt 303 lb 8 oz (137.667 kg)  SpO2 96%   CC: complete form for return to work   Subjective:    Patient ID: James [name], male    DOB: 29-Jun-1981, 35 y.o.   MRN: [id number]  HPI: James [name] is a 35 y.o. male presenting on 02/05/2016 for Follow-up   See prior note and hospitalization for details. Out of work since 12/25/2015. He returned to work Tuesday 01/05/2016. Needs note filled for return to work. He has been back at work since 4/18.   ED visit for suicidal ideation. Established with intensive outpatient behavioral health program. He did see counselor that Monday. He did not start meds for depression.   He was taking calm aid lavender essential oil    HTN - longstanding on atenolol 100mg  daily.   [facility] - treated with ketoconazole 2% cream which is helping.  Relevant past medical, surgical, family and social history reviewed and updated as indicated. Interim medical history since our last visit reviewed. Allergies and medications reviewed and updated. Current Outpatient Prescriptions on File Prior to Visit  Medication Sig  . atenolol (TENORMIN) 100 MG tablet TAKE 1 TABLET (100 MG TOTAL) BY MOUTH DAILY.  Marland Kitchen. Cyanocobalamin (B-12) 3000 MCG CAPS Take 3,000 mcg by mouth daily.  Marland Kitchen. gabapentin (NEURONTIN) 300 MG capsule Take 1 capsule (300 mg total) by mouth as directed. 1 in the morning and 2 at night  . glucose blood (BAYER CONTOUR NEXT TEST) test strip Use as instructed  . metFORMIN (GLUCOPHAGE) 850 MG tablet Take 1 tablet (850 mg total) by mouth 2 (two) times daily with a meal.  . naproxen sodium (ANAPROX) 220 MG tablet Take 220-440 mg by mouth 2 (two) times daily as needed (pain).  . Omega-3 Fatty Acids (FISH OIL PO) Take 2 capsules by mouth daily.  . pravastatin (PRAVACHOL) 40 MG tablet Take 1 tablet (40 mg total) by mouth at bedtime.  . [DISCONTINUED] metoprolol (TOPROL-XL) 50  MG 24 hr tablet Take 50 mg by mouth daily.     No current facility-administered medications on file prior to visit.    Review of Systems Per HPI unless specifically indicated in ROS section     Objective:    BP 132/78 mmHg  Pulse 80  Temp(Src) 98 F (36.7 C) (Oral)  Wt 303 lb 8 oz (137.667 kg)  SpO2 96%  Wt Readings from Last 3 Encounters:  02/05/16 303 lb 8 oz (137.667 kg)  12/25/15 299 lb (135.626 kg)  12/16/15 293 lb (132.904 kg)    Physical Exam  Constitutional: He appears well-developed and well-nourished. No distress.  Cardiovascular: Normal rate, regular rhythm, normal heart sounds and intact distal pulses.   No murmur heard. Pulmonary/Chest: Effort normal and breath sounds normal. No respiratory distress. He has no wheezes. He has no rales.  Musculoskeletal: He exhibits no edema.  Psychiatric: He has a normal mood and affect.  Good spirits today, pleasant and conversant  Nursing note and vitals reviewed.      Assessment & Plan:   Problem List Items Addressed This Visit    Major depressive disorder, recurrent episode, moderate (HCC) - Primary    Resolved. Feels better supported at home. Better insight into stressors, feels work stressors have eased. Back to work full time over the last month. Work note filled out.  Has # to call if feeling overwhelmed. Declines pharmacotherapy and  counseling at this time but has resources available.           Follow up plan: No Follow-up on file.  [name], MD

## 2016-02-05 NOTE — Progress Notes (Signed)
Pre visit review using our clinic review tool, if applicable. No additional management support is needed unless otherwise documented below in the visit note. 

## 2016-02-05 NOTE — Patient Instructions (Addendum)
You are doing well today. No changes in medicines. Let us know if you'd like to discuss mood medicine but it sounds like we're doing well Keep follow up appointment in June. Call us with questions or concerns sooner if needed.

## 2016-02-05 NOTE — Assessment & Plan Note (Signed)
Resolved. Feels better supported at home. Better insight into stressors, feels work stressors have eased. Back to work full time over the last month. Work note filled out.  Has # to call if feeling overwhelmed. Declines pharmacotherapy and counseling at this time but has resources available.

## 2016-03-18 ENCOUNTER — Ambulatory Visit (INDEPENDENT_AMBULATORY_CARE_PROVIDER_SITE_OTHER): Payer: 59 | Admitting: Family Medicine

## 2016-03-18 ENCOUNTER — Encounter: Payer: Self-pay | Admitting: Family Medicine

## 2016-03-18 VITALS — BP 138/80 | HR 78 | Temp 98.7°F

## 2016-03-18 DIAGNOSIS — E1142 Type 2 diabetes mellitus with diabetic polyneuropathy: Secondary | ICD-10-CM | POA: Diagnosis not present

## 2016-03-18 DIAGNOSIS — F331 Major depressive disorder, recurrent, moderate: Secondary | ICD-10-CM

## 2016-03-18 DIAGNOSIS — E538 Deficiency of other specified B group vitamins: Secondary | ICD-10-CM

## 2016-03-18 DIAGNOSIS — M79672 Pain in left foot: Secondary | ICD-10-CM

## 2016-03-18 DIAGNOSIS — M79671 Pain in right foot: Secondary | ICD-10-CM | POA: Diagnosis not present

## 2016-03-18 LAB — HEMOGLOBIN A1C: Hgb A1c MFr Bld: 6.6 % — ABNORMAL HIGH (ref 4.6–6.5)

## 2016-03-18 LAB — VITAMIN B12: Vitamin B-12: 1404 pg/mL — ABNORMAL HIGH (ref 211–911)

## 2016-03-18 NOTE — Assessment & Plan Note (Signed)
Check b12 today. Endorses compliance with 3000 mcg B12

## 2016-03-18 NOTE — Progress Notes (Signed)
Pre visit review using our clinic review tool, if applicable. No additional management support is needed unless otherwise documented below in the visit note. 

## 2016-03-18 NOTE — Assessment & Plan Note (Signed)
Chronic, stable. Check A1c on lower metformin 850mg  qd dose.

## 2016-03-18 NOTE — Progress Notes (Signed)
BP 138/80 mmHg  Pulse 78  Temp(Src) 98.7 F (37.1 C) (Oral)  SpO2 98%   CC: 3 mo f/u visit  Subjective:    Patient ID: James [name], male    DOB: 1980/09/27, 35 y.o.   MRN: [id number]  HPI: James [name] is a 35 y.o. male presenting on 03/18/2016 for Follow-up and Rash   See prior notes for details. Moodwise stable off pharmacotherapy.   Bilateral foot pain - presumed neuropathy with poor foot mechanics (pes planus). On gabapentin 300/600. He's actually taking 1 gabapentin 300mg  in am. Did not undergo NCS. We also started oral B12 for low normal level (219). He has started using scholl's inserts which also has significantly helped. Foot pain markedly better.   DM - we also decreased metformin to 850mg  daily due to diarrhea. States running well controlled. Lab Results  Component Value Date   HGBA1C 6.3 12/07/2015     R foot rash - noticed 2 months ago. Not improved despite ketoconazole 2% cream per [facility]. Not really improving.   Relevant past medical, surgical, family and social history reviewed and updated as indicated. Interim medical history since our last visit reviewed. Allergies and medications reviewed and updated. Current Outpatient Prescriptions on File Prior to Visit  Medication Sig  . atenolol (TENORMIN) 100 MG tablet TAKE 1 TABLET (100 MG TOTAL) BY MOUTH DAILY.  Marland Kitchen. Cyanocobalamin (B-12) 3000 MCG CAPS Take 3,000 mcg by mouth daily.  Marland Kitchen. glucose blood (BAYER CONTOUR NEXT TEST) test strip Use as instructed  . ketoconazole (NIZORAL) 2 % cream Apply 1 application topically daily.  . metFORMIN (GLUCOPHAGE) 850 MG tablet Take 1 tablet (850 mg total) by mouth 2 (two) times daily with a meal.  . naproxen sodium (ANAPROX) 220 MG tablet Take 220-440 mg by mouth 2 (two) times daily as needed (pain).  . Omega-3 Fatty Acids (FISH OIL PO) Take 2 capsules by mouth daily.  . pravastatin (PRAVACHOL) 40 MG tablet Take 1 tablet (40 mg total) by mouth at bedtime.  . [DISCONTINUED]  metoprolol (TOPROL-XL) 50 MG 24 hr tablet Take 50 mg by mouth daily.     No current facility-administered medications on file prior to visit.    Review of Systems Per HPI unless specifically indicated in ROS section     Objective:    BP 138/80 mmHg  Pulse 78  Temp(Src) 98.7 F (37.1 C) (Oral)  SpO2 98%  Wt Readings from Last 3 Encounters:  02/05/16 303 lb 8 oz (137.667 kg)  12/25/15 299 lb (135.626 kg)  12/16/15 293 lb (132.904 kg)    Physical Exam  Constitutional: He appears well-developed and well-nourished. No distress.  Musculoskeletal: He exhibits no edema.  2+ DP on right  Skin: Skin is warm and dry. Rash noted. There is erythema.  Erythematous pruritic annular rash right dorsal foot adjacent to 3rd/4th digits with some satellite lesions, some erythema present at 1/2 interdigital space  Nursing note and vitals reviewed.     Assessment & Plan:   Problem List Items Addressed This Visit    Type 2 diabetes, controlled, with peripheral neuropathy (HCC) - Primary    Chronic, stable. Check A1c on lower metformin 850mg  qd dose.      Relevant Medications   gabapentin (NEURONTIN) 300 MG capsule   Other Relevant Orders   Hemoglobin A1c   Foot pain, bilateral    Marked improvement with b12 supplementation and new insoles. Did not complete NCS Will decrease gabapentin to 300mg  once daily which is also  helping.      Major depressive disorder, recurrent episode, moderate (HCC)    Stable period.      Low vitamin B12 level    Check b12 today. Endorses compliance with 3000 mcg B12      Relevant Orders   Vitamin B12       Follow up plan: No Follow-up on file.  Eustaquio BoydenJavier Quasim Doyon, MD

## 2016-03-18 NOTE — Assessment & Plan Note (Addendum)
Marked improvement with b12 supplementation and new insoles. Did not complete NCS Will decrease gabapentin to 300mg  once daily which is also helping.

## 2016-03-18 NOTE — Assessment & Plan Note (Signed)
Stable period.  

## 2016-03-18 NOTE — Patient Instructions (Signed)
Labs today For skin rash - start clotrimazole cream twice daily, and use cortisone-10 once daily at night time for 1 week. Let us know if not improving.

## 2016-03-21 ENCOUNTER — Encounter: Payer: Self-pay | Admitting: Family Medicine

## 2016-06-23 ENCOUNTER — Encounter: Payer: Self-pay | Admitting: *Deleted

## 2016-06-23 ENCOUNTER — Encounter: Payer: Self-pay | Admitting: Family Medicine

## 2016-07-13 ENCOUNTER — Telehealth: Payer: Self-pay

## 2016-07-13 MED ORDER — ATENOLOL 50 MG PO TABS: 100.0000 mg | ORAL_TABLET | Freq: Every day | ORAL | 3 refills | Status: DC

## 2016-07-13 NOTE — Telephone Encounter (Signed)
[name] said atenolol 100 mg is on back order and request new rx for atenolol 50 mg taking 2 tabs.Please advise.

## 2016-07-13 NOTE — Telephone Encounter (Signed)
Ok to do - plz send in new Rx

## 2016-07-13 NOTE — Telephone Encounter (Signed)
Sent in

## 2016-09-06 ENCOUNTER — Other Ambulatory Visit: Payer: Self-pay | Admitting: Family Medicine

## 2016-10-13 ENCOUNTER — Ambulatory Visit: Payer: Self-pay | Admitting: Family Medicine

## 2016-10-28 ENCOUNTER — Ambulatory Visit (INDEPENDENT_AMBULATORY_CARE_PROVIDER_SITE_OTHER): Payer: 59 | Admitting: Family Medicine

## 2016-10-28 ENCOUNTER — Encounter: Payer: Self-pay | Admitting: Family Medicine

## 2016-10-28 VITALS — BP 124/88 | HR 80 | Temp 98.6°F | Wt 287.8 lb

## 2016-10-28 DIAGNOSIS — E1142 Type 2 diabetes mellitus with diabetic polyneuropathy: Secondary | ICD-10-CM | POA: Diagnosis not present

## 2016-10-28 DIAGNOSIS — IMO0001 Reserved for inherently not codable concepts without codable children: Secondary | ICD-10-CM

## 2016-10-28 DIAGNOSIS — M79672 Pain in left foot: Secondary | ICD-10-CM

## 2016-10-28 DIAGNOSIS — F331 Major depressive disorder, recurrent, moderate: Secondary | ICD-10-CM

## 2016-10-28 DIAGNOSIS — S63502D Unspecified sprain of left wrist, subsequent encounter: Secondary | ICD-10-CM

## 2016-10-28 DIAGNOSIS — M79671 Pain in right foot: Secondary | ICD-10-CM

## 2016-10-28 DIAGNOSIS — E669 Obesity, unspecified: Secondary | ICD-10-CM

## 2016-10-28 DIAGNOSIS — E538 Deficiency of other specified B group vitamins: Secondary | ICD-10-CM

## 2016-10-28 DIAGNOSIS — S63502A Unspecified sprain of left wrist, initial encounter: Secondary | ICD-10-CM | POA: Insufficient documentation

## 2016-10-28 LAB — HEMOGLOBIN A1C: Hgb A1c MFr Bld: 11.7 % — ABNORMAL HIGH (ref 4.6–6.5)

## 2016-10-28 MED ORDER — HYDROXYZINE HCL 25 MG PO TABS: 12.5000 mg | ORAL_TABLET | Freq: Two times a day (BID) | ORAL | 0 refills | Status: DC | PRN

## 2016-10-28 MED ORDER — ATENOLOL 100 MG PO TABS: 100.0000 mg | ORAL_TABLET | Freq: Every day | ORAL | 3 refills | Status: DC

## 2016-10-28 MED ORDER — CITALOPRAM HYDROBROMIDE 10 MG PO TABS: 10.0000 mg | ORAL_TABLET | Freq: Every day | ORAL | 3 refills | Status: DC

## 2016-10-28 MED ORDER — PRAVASTATIN SODIUM 40 MG PO TABS: 40.0000 mg | ORAL_TABLET | Freq: Every day | ORAL | 3 refills | Status: DC

## 2016-10-28 NOTE — Assessment & Plan Note (Signed)
Repleted. Continue daily.

## 2016-10-28 NOTE — Progress Notes (Signed)
Pre visit review using our clinic review tool, if applicable. No additional management support is needed unless otherwise documented below in the visit note. 

## 2016-10-28 NOTE — Assessment & Plan Note (Signed)
No signs of scaphoid fracture or tendonitis. Slowly improving. Anticipate wrist sprain - continue brace and aleve. Discussed anticipated course of improvement.

## 2016-10-28 NOTE — Assessment & Plan Note (Signed)
Largely improved with Scholls insoles.
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
## 2016-10-28 NOTE — Patient Instructions (Addendum)
Let's start celexa 10mg  daily for mood. This will take 3-4 weeks to take full effect. May use hydroxyzine as needed for acute anxiety. Start at 1/2 pill. May make you sleepy.  Wrist sprain should continue to improve over next few weeks.  Congratulations on weight loss! Keep up the good work.  Return in 1 month for follow up visit.
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
## 2016-10-28 NOTE — Assessment & Plan Note (Signed)
Congratulated on weight loss - after GI bug last month.  Pt motivated to continue healthy diet changes for ongoing weight loss.

## 2016-10-28 NOTE — Assessment & Plan Note (Signed)
Chronic, stable on metformin 850mg  bid. Continue. Update A1c today.

## 2016-10-30 ENCOUNTER — Other Ambulatory Visit: Payer: Self-pay | Admitting: Family Medicine

## 2016-10-30 MED ORDER — METFORMIN HCL 1000 MG PO TABS: 1000.0000 mg | ORAL_TABLET | Freq: Two times a day (BID) | ORAL | 6 refills | Status: DC

## 2016-10-30 MED ORDER — GLIMEPIRIDE 2 MG PO TABS: 2.0000 mg | ORAL_TABLET | Freq: Every day | ORAL | 6 refills | Status: DC

## 2016-11-30 ENCOUNTER — Other Ambulatory Visit: Payer: Self-pay | Admitting: Family Medicine

## 2016-11-30 NOTE — Telephone Encounter (Signed)
Ok to refill? Last filled 10/28/16 #30 0RF

## 2016-12-02 ENCOUNTER — Encounter: Payer: Self-pay | Admitting: Family Medicine

## 2016-12-02 ENCOUNTER — Ambulatory Visit (INDEPENDENT_AMBULATORY_CARE_PROVIDER_SITE_OTHER): Payer: 59 | Admitting: Family Medicine

## 2016-12-02 VITALS — BP 124/80 | HR 73 | Temp 98.2°F | Wt 284.2 lb

## 2016-12-02 DIAGNOSIS — I10 Essential (primary) hypertension: Secondary | ICD-10-CM

## 2016-12-02 DIAGNOSIS — E669 Obesity, unspecified: Secondary | ICD-10-CM

## 2016-12-02 DIAGNOSIS — F331 Major depressive disorder, recurrent, moderate: Secondary | ICD-10-CM | POA: Diagnosis not present

## 2016-12-02 DIAGNOSIS — E1142 Type 2 diabetes mellitus with diabetic polyneuropathy: Secondary | ICD-10-CM | POA: Diagnosis not present

## 2016-12-02 LAB — LDL CHOLESTEROL, DIRECT: LDL DIRECT: 106 mg/dL

## 2016-12-02 NOTE — Assessment & Plan Note (Signed)
Chronic, stable. Continue atenolol 100mg  daily.

## 2016-12-02 NOTE — Assessment & Plan Note (Signed)
Congratulated on ongoing weight loss through healthy lifestyle changes.
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
## 2016-12-02 NOTE — Assessment & Plan Note (Addendum)
Improved on celexa 10mg  daily and hydroxyzine PRN. Continue current regimen. Recheck in 2-3 months.
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
## 2016-12-02 NOTE — Patient Instructions (Addendum)
You are doing well today - continue celexa, hydroxyzine, metformin and glimepiride. Check labs today. Return in 2-3 months for follow up visit on diabetes or physical.

## 2016-12-05 LAB — FRUCTOSAMINE: Fructosamine: 287 umol/L — ABNORMAL HIGH (ref 190–270)

## 2016-12-31 LAB — HM DIABETES EYE EXAM

## 2017-01-12 ENCOUNTER — Other Ambulatory Visit: Payer: Self-pay | Admitting: Family Medicine

## 2017-01-23 ENCOUNTER — Encounter: Payer: Self-pay | Admitting: Family Medicine

## 2017-02-10 ENCOUNTER — Other Ambulatory Visit: Payer: Self-pay | Admitting: Family Medicine

## 2017-03-06 ENCOUNTER — Ambulatory Visit (INDEPENDENT_AMBULATORY_CARE_PROVIDER_SITE_OTHER): Payer: 59 | Admitting: Family Medicine

## 2017-03-06 ENCOUNTER — Encounter: Payer: Self-pay | Admitting: Family Medicine

## 2017-03-06 VITALS — BP 122/80 | HR 74 | Temp 97.5°F | Ht 76.0 in | Wt 293.2 lb

## 2017-03-06 DIAGNOSIS — E669 Obesity, unspecified: Secondary | ICD-10-CM

## 2017-03-06 DIAGNOSIS — I10 Essential (primary) hypertension: Secondary | ICD-10-CM | POA: Diagnosis not present

## 2017-03-06 DIAGNOSIS — E1142 Type 2 diabetes mellitus with diabetic polyneuropathy: Secondary | ICD-10-CM

## 2017-03-06 DIAGNOSIS — F331 Major depressive disorder, recurrent, moderate: Secondary | ICD-10-CM

## 2017-03-06 LAB — BASIC METABOLIC PANEL
BUN: 10 mg/dL (ref 6–23)
CO2: 30 meq/L (ref 19–32)
Calcium: 9.7 mg/dL (ref 8.4–10.5)
Chloride: 100 mEq/L (ref 96–112)
Creatinine, Ser: 0.94 mg/dL (ref 0.40–1.50)
GFR: 96.24 mL/min (ref >60.00)
GLUCOSE: 165 mg/dL — AB (ref 70–99)
POTASSIUM: 4.4 meq/L (ref 3.5–5.1)
Sodium: 136 mEq/L (ref 135–145)

## 2017-03-06 LAB — MICROALBUMIN / CREATININE URINE RATIO
Creatinine,U: 166.6 mg/dL
Microalb Creat Ratio: 0.5 mg/g (ref 0.0–30.0)
Microalb, Ur: 0.9 mg/dL (ref 0.0–1.9)

## 2017-03-06 LAB — HEMOGLOBIN A1C: HEMOGLOBIN A1C: 6.4 % (ref 4.6–6.5)

## 2017-03-06 LAB — LDL CHOLESTEROL, DIRECT: LDL DIRECT: 110 mg/dL

## 2017-03-06 NOTE — Assessment & Plan Note (Signed)
Chronic, stable. Update labs.  Continue current medicines.  Discussed monitoring sugars.

## 2017-03-06 NOTE — Assessment & Plan Note (Signed)
Chronic, stable. Continue current regimen. 

## 2017-03-06 NOTE — Assessment & Plan Note (Signed)
Stable period on celexa 10mg daily.  

## 2017-03-06 NOTE — Assessment & Plan Note (Signed)
Discussed healthy diet and lifestyle changes to affect sustainable weight loss  

## 2017-03-06 NOTE — Patient Instructions (Addendum)
Start aspirin 81mg  daily.  Labs today. Continue current medicines and we will be in touch with lab results. Return as needed or in 3-514months for physical

## 2017-03-06 NOTE — Progress Notes (Signed)
BP 122/80   Pulse 74   Temp 97.5 F (36.4 C)   Ht 6\' 4"  (1.93 m)   Wt 293 lb 4 oz (133 kg)   SpO2 97%   BMI 35.70 kg/m    CC: 3 mo f/u visit Subjective:    Patient ID: James [name], male    DOB: 06-12-1981, 36 y.o.   MRN: [id number]  HPI: James [name] is a 36 y.o. male presenting on 03/06/2017 for Follow-up (MEDICATION  GLIMEPIRIDE)   MDD - doing well on celexa 10mg  daily with hydroxyzine PRN.   DM - regularly does check sugars once a week - doesn't remember readings. Compliant with antihyperglycemic regimen which includes: metformin 1000mg  bid and glimepiride 2mg . Some loose stools with metformin. Denies low sugars or hypoglycemic symptoms. Denies paresthesias. Last diabetic eye exam 12/2016. Pneumovax: 2015. Prevnar: not due. Lab Results  Component Value Date   HGBA1C 11.7 (H) 10/28/2016   Diabetic Foot Exam - Simple   No data filed       Obesity - weight gain noted. Treadmill during lunch hour 3-4 times a week. glucerna shake for breakfast, snacks for lunch, sometimes sandwich, avoids fast foods and junk food. doesn' eat out much.   Relevant past medical, surgical, family and social history reviewed and updated as indicated. Interim medical history since our last visit reviewed. Allergies and medications reviewed and updated. Outpatient Medications Prior to Visit  Medication Sig Dispense Refill  . atenolol (TENORMIN) 100 MG tablet Take 1 tablet (100 mg total) by mouth daily. 90 tablet 3  . citalopram (CELEXA) 10 MG tablet TAKE 1 TABLET (10 MG TOTAL) BY MOUTH DAILY. 30 tablet 3  . glimepiride (AMARYL) 2 MG tablet Take 1 tablet (2 mg total) by mouth daily before breakfast. 30 tablet 6  . glucose blood (BAYER CONTOUR NEXT TEST) test strip Use as instructed 100 each 12  . hydrOXYzine (ATARAX/VISTARIL) 25 MG tablet TAKE 0.5-1 TABLETS (12.5-25 MG TOTAL) BY MOUTH 2 (TWO) TIMES DAILY AS NEEDED FOR ANXIETY. 30 tablet 3  . metFORMIN (GLUCOPHAGE) 1000 MG tablet Take 1  tablet (1,000 mg total) by mouth 2 (two) times daily with a meal. 60 tablet 6  . Omega-3 Fatty Acids (FISH OIL PO) Take 3 capsules by mouth daily.     . pravastatin (PRAVACHOL) 40 MG tablet Take 1 tablet (40 mg total) by mouth at bedtime. 90 tablet 3  . naproxen sodium (ANAPROX) 220 MG tablet Take 220-440 mg by mouth 2 (two) times daily as needed (pain).    . MELATONIN PO Take 1 tablet by mouth at bedtime as needed.    . pravastatin (PRAVACHOL) 40 MG tablet TAKE 1 TABLET (40 MG TOTAL) BY MOUTH AT BEDTIME. 90 tablet 3  . vitamin B-12 (CYANOCOBALAMIN) 500 MCG tablet Take 500 mcg by mouth daily.     No facility-administered medications prior to visit.      Per HPI unless specifically indicated in ROS section below Review of Systems     Objective:    BP 122/80   Pulse 74   Temp 97.5 F (36.4 C)   Ht 6\' 4"  (1.93 m)   Wt 293 lb 4 oz (133 kg)   SpO2 97%   BMI 35.70 kg/m   Wt Readings from Last 3 Encounters:  03/06/17 293 lb 4 oz (133 kg)  12/02/16 284 lb 4 oz (128.9 kg)  10/28/16 287 lb 12 oz (130.5 kg)    Physical Exam  Constitutional: He appears well-developed and well-nourished.  No distress.  HENT:  Head: Normocephalic and atraumatic.  Right Ear: External ear normal.  Left Ear: External ear normal.  Nose: Nose normal.  Mouth/Throat: Oropharynx is clear and moist. No oropharyngeal exudate.  Eyes: Conjunctivae and EOM are normal. Pupils are equal, round, and reactive to light. No scleral icterus.  Neck: Normal range of motion. Neck supple.  Cardiovascular: Normal rate, regular rhythm, normal heart sounds and intact distal pulses.   No murmur heard. Pulmonary/Chest: Effort normal and breath sounds normal. No respiratory distress. He has no wheezes. He has no rales.  Musculoskeletal: He exhibits no edema.  See HPI for foot exam if done  Lymphadenopathy:    He has no cervical adenopathy.  Skin: Skin is warm and dry. No rash noted.  Psychiatric: He has a normal mood and affect.   Nursing note and vitals reviewed.  Results for orders placed or performed in visit on 01/23/17  HM DIABETES EYE EXAM  Result Value Ref Range   HM Diabetic Eye Exam No Retinopathy No Retinopathy      Assessment & Plan:   Problem List Items Addressed This Visit    HTN (hypertension)    Chronic, stable. Continue current regimen.       Relevant Medications   aspirin EC 81 MG tablet   Major depressive disorder, recurrent episode, moderate (HCC)    Stable period on celexa 10mg  daily.       Obesity, Class I, BMI 30-34.9    Discussed healthy diet and lifestyle changes to affect sustainable weight loss.       Type 2 diabetes, controlled, with peripheral neuropathy (HCC) - Primary    Chronic, stable. Update labs.  Continue current medicines.  Discussed monitoring sugars.       Relevant Medications   aspirin EC 81 MG tablet   Other Relevant Orders   Hemoglobin A1c   Basic metabolic panel   Microalbumin / creatinine urine ratio   LDL Cholesterol, Direct       Follow up plan: Return in about 3 months (around 06/06/2017) for annual exam, prior fasting for blood work.  Eustaquio BoydenJavier Autumnrose Yore, MD

## 2017-05-10 ENCOUNTER — Other Ambulatory Visit: Payer: Self-pay | Admitting: Family Medicine

## 2017-05-12 ENCOUNTER — Other Ambulatory Visit: Payer: Self-pay | Admitting: Family Medicine

## 2017-05-20 ENCOUNTER — Other Ambulatory Visit: Payer: Self-pay | Admitting: Family Medicine

## 2017-05-25 ENCOUNTER — Other Ambulatory Visit: Payer: Self-pay | Admitting: Family Medicine

## 2017-05-25 ENCOUNTER — Other Ambulatory Visit (INDEPENDENT_AMBULATORY_CARE_PROVIDER_SITE_OTHER): Payer: 59

## 2017-05-25 DIAGNOSIS — E782 Mixed hyperlipidemia: Secondary | ICD-10-CM

## 2017-05-25 DIAGNOSIS — K760 Fatty (change of) liver, not elsewhere classified: Secondary | ICD-10-CM

## 2017-05-25 DIAGNOSIS — E1142 Type 2 diabetes mellitus with diabetic polyneuropathy: Secondary | ICD-10-CM | POA: Diagnosis not present

## 2017-05-25 LAB — COMPREHENSIVE METABOLIC PANEL
ALBUMIN: 4.5 g/dL (ref 3.5–5.2)
ALK PHOS: 53 U/L (ref 39–117)
ALT: 52 U/L (ref 0–53)
AST: 32 U/L (ref 0–37)
BILIRUBIN TOTAL: 0.5 mg/dL (ref 0.2–1.2)
BUN: 10 mg/dL (ref 6–23)
CO2: 26 mEq/L (ref 19–32)
CREATININE: 0.91 mg/dL (ref 0.40–1.50)
Calcium: 9.5 mg/dL (ref 8.4–10.5)
Chloride: 102 mEq/L (ref 96–112)
GFR: 99.79 mL/min (ref >60.00)
GLUCOSE: 193 mg/dL — AB (ref 70–99)
Potassium: 4.2 mEq/L (ref 3.5–5.1)
SODIUM: 137 meq/L (ref 135–145)
TOTAL PROTEIN: 7.4 g/dL (ref 6.0–8.3)

## 2017-05-25 LAB — LIPID PANEL
Cholesterol: 172 mg/dL (ref 0–200)
HDL: 32.2 mg/dL — AB (ref >39.00)
NONHDL: 139.59
Total CHOL/HDL Ratio: 5
Triglycerides: 210 mg/dL — ABNORMAL HIGH (ref 0.0–149.0)
VLDL: 42 mg/dL — ABNORMAL HIGH (ref 0.0–40.0)

## 2017-05-25 LAB — LDL CHOLESTEROL, DIRECT: LDL DIRECT: 106 mg/dL

## 2017-05-25 LAB — MICROALBUMIN / CREATININE URINE RATIO
CREATININE, U: 184.8 mg/dL
Microalb Creat Ratio: 0.5 mg/g (ref 0.0–30.0)
Microalb, Ur: 0.9 mg/dL (ref 0.0–1.9)

## 2017-05-29 ENCOUNTER — Encounter: Payer: Self-pay | Admitting: Family Medicine

## 2017-05-29 ENCOUNTER — Ambulatory Visit (INDEPENDENT_AMBULATORY_CARE_PROVIDER_SITE_OTHER): Payer: 59 | Admitting: Family Medicine

## 2017-05-29 VITALS — BP 110/72 | HR 76 | Temp 98.1°F | Ht 76.0 in | Wt 308.0 lb

## 2017-05-29 DIAGNOSIS — F331 Major depressive disorder, recurrent, moderate: Secondary | ICD-10-CM | POA: Diagnosis not present

## 2017-05-29 DIAGNOSIS — Z23 Encounter for immunization: Secondary | ICD-10-CM

## 2017-05-29 DIAGNOSIS — I10 Essential (primary) hypertension: Secondary | ICD-10-CM | POA: Diagnosis not present

## 2017-05-29 DIAGNOSIS — E669 Obesity, unspecified: Secondary | ICD-10-CM

## 2017-05-29 DIAGNOSIS — E1142 Type 2 diabetes mellitus with diabetic polyneuropathy: Secondary | ICD-10-CM | POA: Diagnosis not present

## 2017-05-29 DIAGNOSIS — K760 Fatty (change of) liver, not elsewhere classified: Secondary | ICD-10-CM

## 2017-05-29 DIAGNOSIS — E782 Mixed hyperlipidemia: Secondary | ICD-10-CM | POA: Diagnosis not present

## 2017-05-29 DIAGNOSIS — M25512 Pain in left shoulder: Secondary | ICD-10-CM | POA: Diagnosis not present

## 2017-05-29 DIAGNOSIS — Z0000 Encounter for general adult medical examination without abnormal findings: Secondary | ICD-10-CM

## 2017-05-29 NOTE — Assessment & Plan Note (Addendum)
Chronic, stable on pravastatin and fish oil 3 capsules daily.

## 2017-05-29 NOTE — Assessment & Plan Note (Signed)
LFTs stable.

## 2017-05-29 NOTE — Progress Notes (Signed)
BP 110/72 (BP Location: Left Arm, Patient Position: Sitting, Cuff Size: Large)   Pulse 76   Temp 98.1 F (36.7 C) (Oral)   Ht 6\' 4"  (1.93 m)   Wt (!) 308 lb (139.7 kg)   SpO2 97%   BMI 37.49 kg/m    CC: CPE Subjective:    Patient ID: James [name], male    DOB: 04-Dec-1980, 36 y.o.   MRN: [id number]  HPI: James [name] is a 36 y.o. male presenting on 05/29/2017 for Annual Exam and Shoulder Pain (Left shoulder pain for last 2 weeks. Has not taken anything, Applied topical OTC pain relief, helpful)   2 wk h/o L shoulder pain. ?from lifting free weights so he stopped this. Denies inciting trauma/injury. Treating with bengay therapy. Pulling feeling in back of shoulder. Worse with leaning forward.   Hasn't been checking sugars.   Preventative: Flu shot yearly Tdap 04/2014 Pneumovax 05/2014 Seat belt use discussed Sunscreen use discussed. No changing moles on skin. 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  Diet: good water, fruits/vegetables daily, avoids soda and fast foods   Relevant past medical, surgical, family and social history reviewed and updated as indicated. Interim medical history since our last visit reviewed. Allergies and medications reviewed and updated. Outpatient Medications Prior to Visit  Medication Sig Dispense Refill  . aspirin EC 81 MG tablet Take 81 mg by mouth daily.    Marland Kitchen. atenolol (TENORMIN) 100 MG tablet Take 1 tablet (100 mg total) by mouth daily. 90 tablet 3  . citalopram (CELEXA) 10 MG tablet TAKE 1 TABLET (10 MG TOTAL) BY MOUTH DAILY. 30 tablet 3  . glimepiride (AMARYL) 2 MG tablet TAKE 1 TABLET BY MOUTH EVERY DAY BEFORE BREAKFAST 30 tablet 6  . glucose blood (BAYER CONTOUR NEXT TEST) test strip Use as instructed 100 each 12  . hydrOXYzine (ATARAX/VISTARIL) 25 MG tablet TAKE 0.5-1 TABLETS (12.5-25 MG TOTAL) BY MOUTH 2  (TWO) TIMES DAILY AS NEEDED FOR ANXIETY. 30 tablet 3  . metFORMIN (GLUCOPHAGE) 1000 MG tablet TAKE 1 TABLET (1,000 MG TOTAL) BY MOUTH 2 (TWO) TIMES DAILY WITH A MEAL. 60 tablet 6  . Omega-3 Fatty Acids (FISH OIL PO) Take 3 capsules by mouth daily.     . pravastatin (PRAVACHOL) 40 MG tablet Take 1 tablet (40 mg total) by mouth at bedtime. 90 tablet 3   No facility-administered medications prior to visit.      Per HPI unless specifically indicated in ROS section below Review of Systems  Constitutional: Negative for activity change, appetite change, chills, fatigue, fever and unexpected weight change.  HENT: Negative for hearing loss.   Eyes: Negative for visual disturbance.  Respiratory: Negative for cough, chest tightness, shortness of breath and wheezing.   Cardiovascular: Negative for chest pain, palpitations and leg swelling.  Gastrointestinal: Negative for abdominal distention, abdominal pain, blood in stool, constipation, diarrhea, nausea and vomiting.  Genitourinary: Negative for difficulty urinating and hematuria.  Musculoskeletal: Negative for arthralgias, myalgias and neck pain.  Skin: Negative for rash.  Neurological: Negative for dizziness, seizures, syncope and headaches.  Hematological: Negative for adenopathy. Does not bruise/bleed easily.  Psychiatric/Behavioral: Negative for dysphoric mood. The patient is not nervous/anxious.        Objective:    BP 110/72 (BP Location: Left Arm, Patient Position: Sitting, Cuff Size: Large)   Pulse 76   Temp 98.1  F (36.7 C) (Oral)   Ht  (1.93 m)   Wt (!) 308 lb (139.7 kg)   SpO2 97%   BMI 37.49 kg/m   Wt Readings from Last 3 Encounters:  05/29/17 (!) 308 lb (139.7 kg)  03/06/17 293 lb 4 oz (133 kg)  12/02/16 284 lb 4 oz (128.9 kg)    Physical Exam  Constitutional: He is oriented to person, place, and time. He appears well-developed and well-nourished. No distress.  HENT:  Head: Normocephalic and atraumatic.  Right  Ear: Hearing, tympanic membrane, external ear and ear canal normal.  Left Ear: Hearing, tympanic membrane, external ear and ear canal normal.  Nose: Nose normal.  Mouth/Throat: Uvula is midline, oropharynx is clear and moist and mucous membranes are normal. No oropharyngeal exudate, posterior oropharyngeal edema or posterior oropharyngeal erythema.  Eyes: Pupils are equal, round, and reactive to light. Conjunctivae and EOM are normal. No scleral icterus.  Neck: Normal range of motion. Neck supple. No thyromegaly present.  Cardiovascular: Normal rate, regular rhythm, normal heart sounds and intact distal pulses.   No murmur heard. Pulses:      Radial pulses are 2+ on the right side, and 2+ on the left side.  Pulmonary/Chest: Effort normal and breath sounds normal. No respiratory distress. He has no wheezes. He has no rales.  Abdominal: Soft. Bowel sounds are normal. He exhibits no distension and no mass. There is no tenderness. There is no rebound and no guarding.  Musculoskeletal: Normal range of motion. He exhibits no edema.  R shoulder WNL L shoulder exam: No deformity of shoulders on inspection. No pain with palpation of shoulder landmarks. FROM in abduction and forward flexion. No pain or weakness with testing SITS in ext/int rotation. No pain with empty can sign. POSITIVE Speed test. No impingement. No pain with crossover test. No pain with rotation of humeral head in GH joint.   Lymphadenopathy:    He has no cervical adenopathy.  Neurological: He is alert and oriented to person, place, and time.  CN grossly intact, station and gait intact 5/5 strength BUE  Skin: Skin is warm and dry. No rash noted.  Psychiatric: He has a normal mood and affect. His behavior is normal. Judgment and thought content normal.  Nursing note and vitals reviewed.  Results for orders placed or performed in visit on 05/25/17  Microalbumin / creatinine urine ratio  Result Value Ref Range   Microalb, Ur  0.9 0.0 - 1.9 mg/dL   Creatinine,U 161.0 mg/dL   Microalb Creat Ratio 0.5 0.0 - 30.0 mg/g  Lipid panel  Result Value Ref Range   Cholesterol 172 0 - 200 mg/dL   Triglycerides 960.4 (H) 0.0 - 149.0 mg/dL   HDL 54.09 (L) >81.19 mg/dL   VLDL 14.7 (H) 0.0 - 82.9 mg/dL   Total CHOL/HDL Ratio 5    NonHDL 139.59   Comprehensive metabolic panel  Result Value Ref Range   Sodium 137 135 - 145 mEq/L   Potassium 4.2 3.5 - 5.1 mEq/L   Chloride 102 96 - 112 mEq/L   CO2 26 19 - 32 mEq/L   Glucose, Bld 193 (H) 70 - 99 mg/dL   BUN 10 6 - 23 mg/dL   Creatinine, Ser 5.62 0.40 - 1.50 mg/dL   Total Bilirubin 0.5 0.2 - 1.2 mg/dL   Alkaline Phosphatase 53 39 - 117 U/L   AST 32 0 - 37 U/L   ALT 52 0 - 53 U/L   Total Protein 7.4  6.0 - 8.3 g/dL   Albumin 4.5 3.5 - 5.2 g/dL   Calcium 9.5 8.4 - 45.4 mg/dL   GFR 09.81 >19.14 mL/min  LDL cholesterol, direct  Result Value Ref Range   Direct LDL 106.0 mg/dL      Assessment & Plan:   Problem List Items Addressed This Visit    Health maintenance examination - Primary    Preventative protocols reviewed and updated unless pt declined. Discussed healthy diet and lifestyle.       HTN (hypertension)    Chronic, stable. Continue current regimen.       Left shoulder pain    Consistent with biceps tendonitis. Supportive care as per instructions. Provided with resistance band and stretching/strengthening exercises.      Major depressive disorder, recurrent episode, moderate (HCC)    Stable period on celexa.       Mixed hyperlipidemia    Chronic, stable on pravastatin and fish oil 3 capsules daily.      NAFLD (nonalcoholic fatty liver disease)    LFTs stable.       Obesity, Class I, BMI 30-34.9    Encouraged ongoing healthy diet and lifestyle changes to affect sustainable weight loss.       Type 2 diabetes, controlled, with peripheral neuropathy (HCC)    Chronic, deteriorated with noted weight gain. Too soon for A1c. rec more regular checking  of sugars and to bring in log next visit to review.        Other Visit Diagnoses    Need for influenza vaccination       Relevant Orders   Flu Vaccine QUAD 6+ mos PF IM (Fluarix Quad PF) (Completed)       Follow up plan: Return in about 2 months (around 07/29/2017) for follow up visit.  [name], MD

## 2017-05-29 NOTE — Assessment & Plan Note (Signed)
Chronic, stable. Continue current regimen. 

## 2017-05-29 NOTE — Patient Instructions (Signed)
Flu shot today. I do think you have biceps tendonitis. Treat with aleve anti inflammatory twice daily with meals for 5 days then as needed, exercises provided today. Ok to continue ben-gay cream. Let us know if not improving with this. Sugars were a bit worse - start checking more regularly and bring log to next appointment. Return in 2 months for diabetes check.   Health Maintenance, Male A healthy lifestyle and preventive care is important for your health and wellness. Ask your health care provider about what schedule of regular examinations is right for you. What should I know about weight and diet? Eat a Healthy Diet  Eat plenty of vegetables, fruits, whole grains, low-fat dairy products, and lean protein.  Do not eat a lot of foods high in solid fats, added sugars, or salt.  Maintain a Healthy Weight Regular exercise can help you achieve or maintain a healthy weight. You should:  Do at least 150 minutes of exercise each week. The exercise should increase your heart rate and make you sweat (moderate-intensity exercise).  Do strength-training exercises at least twice a week.  Watch Your Levels of Cholesterol and Blood Lipids  Have your blood tested for lipids and cholesterol every 5 years starting at 37 years of age. If you are at high risk for heart disease, you should start having your blood tested when you are 37 years old. You may need to have your cholesterol levels checked more often if: ? Your lipid or cholesterol levels are high. ? You are older than 37 years of age. ? You are at high risk for heart disease.  What should I know about cancer screening? Many types of cancers can be detected early and may often be prevented. Lung Cancer  You should be screened every year for lung cancer if: ? You are a current smoker who has smoked for at least 30 years. ? You are a former smoker who has quit within the past 15 years.  Talk to your health care provider about your screening  options, when you should start screening, and how often you should be screened.  Colorectal Cancer  Routine colorectal cancer screening usually begins at 37 years of age and should be repeated every 5-10 years until you are 37 years old. You may need to be screened more often if early forms of precancerous polyps or small growths are found. Your health care provider may recommend screening at an earlier age if you have risk factors for colon cancer.  Your health care provider may recommend using home test kits to check for hidden blood in the stool.  A small camera at the end of a tube can be used to examine your colon (sigmoidoscopy or colonoscopy). This checks for the earliest forms of colorectal cancer.  Prostate and Testicular Cancer  Depending on your age and overall health, your health care provider may do certain tests to screen for prostate and testicular cancer.  Talk to your health care provider about any symptoms or concerns you have about testicular or prostate cancer.  Skin Cancer  Check your skin from head to toe regularly.  Tell your health care provider about any new moles or changes in moles, especially if: ? There is a change in a mole's size, shape, or color. ? You have a mole that is larger than a pencil eraser.  Always use sunscreen. Apply sunscreen liberally and repeat throughout the day.  Protect yourself by wearing long sleeves, pants, a wide-brimmed hat, and sunglasses  when outside.  What should I know about heart disease, diabetes, and high blood pressure?  If you are 3118-37 years of age, have your blood pressure checked every 3-5 years. If you are 37 years of age or older, have your blood pressure checked every year. You should have your blood pressure measured twice-once when you are at a hospital or clinic, and once when you are not at a hospital or clinic. Record the average of the two measurements. To check your blood pressure when you are not at a hospital  or clinic, you can use: ? An automated blood pressure machine at a pharmacy. ? A home blood pressure monitor.  Talk to your health care provider about your target blood pressure.  If you are between 5145-37 years old, ask your health care provider if you should take aspirin to prevent heart disease.  Have regular diabetes screenings by checking your fasting blood sugar level. ? If you are at a normal weight and have a low risk for diabetes, have this test once every three years after the age of 37. ? If you are overweight and have a high risk for diabetes, consider being tested at a younger age or more often.  A one-time screening for abdominal aortic aneurysm (AAA) by ultrasound is recommended for men aged 65-75 years who are current or former smokers. What should I know about preventing infection? Hepatitis B If you have a higher risk for hepatitis B, you should be screened for this virus. Talk with your health care provider to find out if you are at risk for hepatitis B infection. Hepatitis C Blood testing is recommended for:  Everyone born from [id number] through 1966.  Anyone with known risk factors for hepatitis C.  Sexually Transmitted Diseases (STDs)  You should be screened each year for STDs including gonorrhea and chlamydia if: ? You are sexually active and are younger than 37 years of age. ? You are older than 37 years of age and your health care provider tells you that you are at risk for this type of infection. ? Your sexual activity has changed since you were last screened and you are at an increased risk for chlamydia or gonorrhea. Ask your health care provider if you are at risk.  Talk with your health care provider about whether you are at high risk of being infected with HIV. Your health care provider may recommend a prescription medicine to help prevent HIV infection.  What else can I do?  Schedule regular health, dental, and eye exams.  Stay current with your vaccines  (immunizations).  Do not use any tobacco products, such as cigarettes, chewing tobacco, and e-cigarettes. If you need help quitting, ask your health care provider.  Limit alcohol intake to no more than 2 drinks per day. One drink equals 12 ounces of beer, 5 ounces of wine, or 1 ounces of hard liquor.  Do not use street drugs.  Do not share needles.  Ask your health care provider for help if you need support or information about quitting drugs.  Tell your health care provider if you often feel depressed.  Tell your health care provider if you have ever been abused or do not feel safe at home. This information is not intended to replace advice given to you by your health care provider. Make sure you discuss any questions you have with your health care provider. Document Released: 03/03/2008 Document Revised: 05/04/2016 Document Reviewed: 06/09/2015 Elsevier Interactive Patient Education  2018 [employer]  

## 2017-05-29 NOTE — Assessment & Plan Note (Signed)
Chronic, deteriorated with noted weight gain. Too soon for A1c. rec more regular checking of sugars and to bring in log next visit to review.

## 2017-05-29 NOTE — Assessment & Plan Note (Signed)
Preventative protocols reviewed and updated unless pt declined. Discussed healthy diet and lifestyle.  

## 2017-05-29 NOTE — Assessment & Plan Note (Signed)
Stable period on celexa.  

## 2017-05-29 NOTE — Assessment & Plan Note (Addendum)
Encouraged ongoing healthy diet and lifestyle changes to affect sustainable weight loss.  

## 2017-05-29 NOTE — Assessment & Plan Note (Addendum)
Consistent with biceps tendonitis. Supportive care as per instructions. Provided with resistance band and stretching/strengthening exercises.

## 2017-06-11 ENCOUNTER — Other Ambulatory Visit: Payer: Self-pay | Admitting: Family Medicine

## 2017-07-08 ENCOUNTER — Other Ambulatory Visit: Payer: Self-pay | Admitting: Family Medicine

## 2017-08-08 ENCOUNTER — Other Ambulatory Visit: Payer: Self-pay | Admitting: Family Medicine

## 2017-08-24 ENCOUNTER — Ambulatory Visit: Payer: 59 | Admitting: Family Medicine

## 2017-08-24 ENCOUNTER — Encounter: Payer: Self-pay | Admitting: Family Medicine

## 2017-08-24 VITALS — BP 132/80 | HR 86 | Temp 98.4°F | Wt 310.0 lb

## 2017-08-24 DIAGNOSIS — E1142 Type 2 diabetes mellitus with diabetic polyneuropathy: Secondary | ICD-10-CM

## 2017-08-24 LAB — HEMOGLOBIN A1C: HEMOGLOBIN A1C: 11.1 % — AB (ref 4.6–6.5)

## 2017-08-24 NOTE — Progress Notes (Signed)
BP 132/80 (BP Location: Left Arm, Patient Position: Sitting, Cuff Size: Large)   Pulse 86   Temp 98.4 F (36.9 C) (Oral)   Wt (!) 310 lb (140.6 kg)   SpO2 95%   BMI 37.73 kg/m    CC: 3 mo f/u visit Subjective:    Patient ID: James [name], male    DOB: 03-02-1981, 36 y.o.   MRN: [id number]  HPI: James [name] is a 36 y.o. male presenting on 08/24/2017 for 3 mo follow-up   See prior note for details.  DM - has been checking sugars 70s fasting, 120s 2 hours after meal. Compliant with antihyperglycemic regimen which includes: metformin 1000mg  bid and amaryl 2mg  daily with breakfast. Denies low sugars or hypoglycemic symptoms. Denies paresthesias. Last diabetic eye exam 12/2016. Pneumovax: 2015. Prevnar: not due. Glucometer brand: bayer contour next. DSME: has not completed.  Lab Results  Component Value Date   HGBA1C 6.4 03/06/2017   Diabetic Foot Exam - Simple   Simple Foot Form Diabetic Foot exam was performed with the following findings:  Yes 08/24/2017  8:06 AM  Visual Inspection No deformities, no ulcerations, no other skin breakdown bilaterally:  Yes Sensation Testing Intact to touch and monofilament testing bilaterally:  Yes Pulse Check Posterior Tibialis and Dorsalis pulse intact bilaterally:  Yes Comments    Lab Results  Component Value Date   MICROALBUR 0.9 05/25/2017     Relevant past medical, surgical, family and social history reviewed and updated as indicated. Interim medical history since our last visit reviewed. Allergies and medications reviewed and updated. Outpatient Medications Prior to Visit  Medication Sig Dispense Refill  . aspirin EC 81 MG tablet Take 81 mg by mouth daily.    Marland Kitchen. atenolol (TENORMIN) 100 MG tablet Take 1 tablet (100 mg total) by mouth daily. 90 tablet 3  . citalopram (CELEXA) 10 MG tablet TAKE 1 TABLET (10 MG TOTAL) BY MOUTH DAILY. 30 tablet 2  . glimepiride (AMARYL) 2 MG tablet TAKE 1 TABLET BY MOUTH EVERY DAY BEFORE  BREAKFAST 30 tablet 0  . glucose blood (BAYER CONTOUR NEXT TEST) test strip Use as instructed 100 each 12  . hydrOXYzine (ATARAX/VISTARIL) 25 MG tablet TAKE 0.5-1 TABLETS (12.5-25 MG TOTAL) BY MOUTH 2 (TWO) TIMES DAILY AS NEEDED FOR ANXIETY. 30 tablet 3  . metFORMIN (GLUCOPHAGE) 1000 MG tablet TAKE 1 TABLET (1,000 MG TOTAL) BY MOUTH 2 (TWO) TIMES DAILY WITH A MEAL. 60 tablet 6  . Omega-3 Fatty Acids (FISH OIL PO) Take 3 capsules by mouth daily.     . pravastatin (PRAVACHOL) 40 MG tablet Take 1 tablet (40 mg total) by mouth at bedtime. 90 tablet 3   No facility-administered medications prior to visit.      Per HPI unless specifically indicated in ROS section below Review of Systems     Objective:    BP 132/80 (BP Location: Left Arm, Patient Position: Sitting, Cuff Size: Large)   Pulse 86   Temp 98.4 F (36.9 C) (Oral)   Wt (!) 310 lb (140.6 kg)   SpO2 95%   BMI 37.73 kg/m   Wt Readings from Last 3 Encounters:  08/24/17 (!) 310 lb (140.6 kg)  05/29/17 (!) 308 lb (139.7 kg)  03/06/17 293 lb 4 oz (133 kg)    Physical Exam  Constitutional: He appears well-developed and well-nourished. No distress.  Musculoskeletal: Normal range of motion. He exhibits no edema.  See HPI for foot exam.   Nursing note and vitals reviewed.  Results  for orders placed or performed in visit on 05/25/17  Microalbumin / creatinine urine ratio  Result Value Ref Range   Microalb, Ur 0.9 0.0 - 1.9 mg/dL   Creatinine,U 161.0184.8 mg/dL   Microalb Creat Ratio 0.5 0.0 - 30.0 mg/g  Lipid panel  Result Value Ref Range   Cholesterol 172 0 - 200 mg/dL   Triglycerides 960.4210.0 (H) 0.0 - 149.0 mg/dL   HDL 54.0932.20 (L) >81.19>39.00 mg/dL   VLDL 14.742.0 (H) 0.0 - 82.940.0 mg/dL   Total CHOL/HDL Ratio 5    NonHDL 139.59   Comprehensive metabolic panel  Result Value Ref Range   Sodium 137 135 - 145 mEq/L   Potassium 4.2 3.5 - 5.1 mEq/L   Chloride 102 96 - 112 mEq/L   CO2 26 19 - 32 mEq/L   Glucose, Bld 193 (H) 70 - 99 mg/dL   BUN  10 6 - 23 mg/dL   Creatinine, Ser 5.620.91 0.40 - 1.50 mg/dL   Total Bilirubin 0.5 0.2 - 1.2 mg/dL   Alkaline Phosphatase 53 39 - 117 U/L   AST 32 0 - 37 U/L   ALT 52 0 - 53 U/L   Total Protein 7.4 6.0 - 8.3 g/dL   Albumin 4.5 3.5 - 5.2 g/dL   Calcium 9.5 8.4 - 13.010.5 mg/dL   GFR 86.5799.79 >84.69>60.00 mL/min  LDL cholesterol, direct  Result Value Ref Range   Direct LDL 106.0 mg/dL      Assessment & Plan:   Problem List Items Addressed This Visit    Type 2 diabetes, controlled, with peripheral neuropathy (HCC) - Primary    Chronic, stable. Continue current regimen. Encouraged low sugar diet.  Check A1c today.  Return 6-8 mo f/u visit.       Relevant Orders   Hemoglobin A1c       Follow up plan: Return in about 6 months (around 02/22/2018) for follow up visit.  James BoydenJavier Blimie Vaness, James [name]

## 2017-08-24 NOTE — Assessment & Plan Note (Signed)
Chronic, stable. Continue current regimen. Encouraged low sugar diet.  Check A1c today.  Return 6-8 mo f/u visit.

## 2017-08-24 NOTE — Patient Instructions (Signed)
Sugars sound well controlled. Labs today.  Return as needed or in 6-8 months for follow up visit.

## 2017-08-26 ENCOUNTER — Other Ambulatory Visit: Payer: Self-pay | Admitting: Family Medicine

## 2017-08-26 DIAGNOSIS — IMO0002 Reserved for concepts with insufficient information to code with codable children: Secondary | ICD-10-CM

## 2017-08-26 DIAGNOSIS — E1165 Type 2 diabetes mellitus with hyperglycemia: Principal | ICD-10-CM

## 2017-08-26 DIAGNOSIS — E1140 Type 2 diabetes mellitus with diabetic neuropathy, unspecified: Secondary | ICD-10-CM

## 2017-09-04 ENCOUNTER — Telehealth: Payer: Self-pay | Admitting: Family Medicine

## 2017-09-04 NOTE — Telephone Encounter (Unsigned)
Copied from CRM [id number]. Topic: Referral - Question >> Sep 04, 2017 11:38 AM [name], Donna F wrote: Reason for CRM: [name]*** [name] called patient to see if pt wanted referral either [facility] or [facility] and pt is preferring [facility]

## 2017-09-22 ENCOUNTER — Other Ambulatory Visit: Payer: Self-pay | Admitting: Family Medicine

## 2017-09-28 ENCOUNTER — Encounter: Payer: Self-pay | Admitting: *Deleted

## 2017-09-28 ENCOUNTER — Encounter: Payer: 59 | Attending: Family Medicine | Admitting: *Deleted

## 2017-09-28 VITALS — BP 134/96 | Ht 76.0 in | Wt 307.7 lb

## 2017-09-28 DIAGNOSIS — E1165 Type 2 diabetes mellitus with hyperglycemia: Secondary | ICD-10-CM | POA: Insufficient documentation

## 2017-09-28 DIAGNOSIS — E1140 Type 2 diabetes mellitus with diabetic neuropathy, unspecified: Secondary | ICD-10-CM | POA: Diagnosis not present

## 2017-09-28 DIAGNOSIS — Z713 Dietary counseling and surveillance: Secondary | ICD-10-CM | POA: Insufficient documentation

## 2017-09-28 NOTE — Patient Instructions (Addendum)
Check blood sugars 2 x day before breakfast and 2 hrs after supper every day Bring blood sugar records to the next class  Exercise: Walk for exercise - nothing strenuous until blood sugars less than 250   Eat 3 meals day, 1-2 snacks a day Space meals 4-6 hours apart Limit fried foods and those high in fat Avoid sugar sweetened drinks (soda, tea, juices) Drink plenty of water  Return for classes on:

## 2017-09-28 NOTE — Progress Notes (Signed)
Diabetes Self-Management Education  Visit Type: First/Initial  Appt. Start Time: 1600 Appt. End Time: 1705  09/28/2017  [name], identified by name and date of birth, is a 37 y.o. male with a diagnosis of Diabetes: Type 2.   ASSESSMENT  Blood pressure (!) 134/96, height 6\' 4"  (1.93 m), weight (!) 307 lb 11.2 oz (139.6 kg). Body mass index is 37.45 kg/m.  Diabetes Self-Management Education - 09/28/17 1716      Visit Information   Visit Type  First/Initial      Initial Visit   Diabetes Type  Type 2    Are you currently following a meal plan?  No    Are you taking your medications as prescribed?  Yes    Date Diagnosed  3 years ago      Health Coping   How would you rate your overall health?  Good      Psychosocial Assessment   Patient Belief/Attitude about Diabetes  Motivated to manage diabetes    Self-care barriers  None    Self-management support  Doctor's office;Family    Patient Concerns  Nutrition/Meal planning;Medication;Glycemic Control;Healthy Lifestyle;Monitoring    Special Needs  None    Preferred Learning Style  Visual;Auditory    Learning Readiness  Ready    How often do you need to have someone help you when you read instructions, pamphlets, or other written materials from your doctor or pharmacy?  1 - Never    What is the last grade level you completed in school?  2 year [employer]      Pre-Education Assessment   Patient understands the diabetes disease and treatment process.  Needs Instruction    Patient understands incorporating nutritional management into lifestyle.  Needs Instruction    Patient undertands incorporating physical activity into lifestyle.  Needs Review    Patient understands using medications safely.  Needs Instruction    Patient understands monitoring blood glucose, interpreting and using results  Needs Review    Patient understands prevention, detection, and treatment of acute complications.  Needs Instruction    Patient  understands prevention, detection, and treatment of chronic complications.  Needs Instruction    Patient understands how to develop strategies to address psychosocial issues.  Needs Instruction    Patient understands how to develop strategies to promote health/change behavior.  Needs Instruction      Complications   Last HgB A1C per patient/outside source  11.1 %    How often do you check your blood sugar?  0 times/day (not testing) Pt has a meter but is not checking his blood sugars. BG in the office was 363 mg/dl at 1:474:55 pm - 4 1/2 hrs pp. Pt had sesame chicken, fried rice, egg roll and 1/2 & 1/2 tea for lunch.     Have you had a dilated eye exam in the past 12 months?  Yes    Have you had a dental exam in the past 12 months?  Yes    Are you checking your feet?  Yes    How many days per week are you checking your feet?  4      Dietary Intake   Breakfast  Glucerna shake with banana or apple    Lunch  eats out for 5 meals/week - grilled chicken sandwich with potato, burger and fries, [employer] sub with chips, [place]    Snack (afternoon)  chips, candy    Dinner  same as lunch or ham sandwich, pork, fish, peas, corn, beans, pasta,  salad with cuccumbers and carrots    Beverage(s)  water, soft drinks, tea, diet sodas      Exercise   Exercise Type  ADL's      Patient Education   Previous Diabetes Education  No    Disease state   Definition of diabetes, type 1 and 2, and the diagnosis of diabetes;Explored patient's options for treatment of their diabetes    Nutrition management   Role of diet in the treatment of diabetes and the relationship between the three main macronutrients and blood glucose level;Reviewed blood glucose goals for pre and post meals and how to evaluate the patients' food intake on their blood glucose level.;Meal timing in regards to the patients' current diabetes medication.    Physical activity and exercise   Role of exercise on diabetes management, blood pressure control and  cardiac health.    Medications  Reviewed patients medication for diabetes, action, purpose, timing of dose and side effects.    Monitoring  Purpose and frequency of SMBG.;Taught/discussed recording of test results and interpretation of SMBG.;Identified appropriate SMBG and/or A1C goals.    Chronic complications  Relationship between chronic complications and blood glucose control    Psychosocial adjustment  Identified and addressed patients feelings and concerns about diabetes      Individualized Goals (developed by patient)   Reducing Risk  Improve blood sugars Decrease medications Prevent diabetes complications Lose weight Lead a healthier lifestyle Become more fit Quit smoking     Outcomes   Future DMSE  4-6 wks       Individualized Plan for Diabetes Self-Management Training:   Learning Objective:  Patient will have a greater understanding of diabetes self-management. Patient education plan is to attend individual and/or group sessions per assessed needs and concerns.   Plan:   Patient Instructions  Check blood sugars 2 x day before breakfast and 2 hrs after supper every day Bring blood sugar records to the next class Exercise: Walk for exercise - nothing strenuous until blood sugars less than 250  Eat 3 meals day, 1-2 snacks a day Space meals 4-6 hours apart Limit fried foods and those high in fat Avoid sugar sweetened drinks (soda, tea, juices) Drink plenty of water  Expected Outcomes:   Demonstrated interest in learning. Expect positive outcomes.  Education material provided: General Meal Planning Guidelines Simple Meal Plan  If problems or questions, patient to contact team via:  Sharion Settler, RN, CCM, CDE [phone]  Future DSME appointment: 4-6 wks  October 23, 2017 for Diabetes Class 1

## 2017-10-05 ENCOUNTER — Telehealth: Payer: Self-pay | Admitting: *Deleted

## 2017-10-05 NOTE — Telephone Encounter (Signed)
Phone call to patient regarding high blood sugar readings since initial appointment last week. He reports all blood sugars - fasting and post meal readings have ranged from 220-240's mg/dL. Fax sent to Dr Sharen HonesGutierrez. Patient returns in February for diabetes classes.

## 2017-10-08 MED ORDER — GLIMEPIRIDE 4 MG PO TABS: 4.0000 mg | ORAL_TABLET | Freq: Every day | ORAL | 6 refills | Status: DC

## 2017-10-08 NOTE — Telephone Encounter (Signed)
Received fax. Lab Results  Component Value Date   HGBA1C 11.1 (H) 08/24/2017    plz call patient - I received update about his sugars - staying too high. Recommend increasing amaryl to 4mg  daily (always with breakfast - he may double up on current dose, new dose sent to pharmacy) and would have him strongly consider insulin if not improving. Please have him call me in 2 weeks with sugar readings to titrate medicines accordingly.

## 2017-10-08 NOTE — Addendum Note (Signed)
Addended by: Eustaquio BoydenGUTIERREZ, Adolph Clutter on: 10/08/2017 10:36 PM   Modules accepted: Orders

## 2017-10-09 NOTE — Telephone Encounter (Signed)
Attempted to contact pt several times today.  Line has been busy.  Will try again tomorrow.

## 2017-10-10 NOTE — Telephone Encounter (Signed)
Attempted again several times today to contact pt. Line is still busy. Will try again tomorrow.

## 2017-10-12 ENCOUNTER — Other Ambulatory Visit: Payer: Self-pay | Admitting: Family Medicine

## 2017-10-12 NOTE — Telephone Encounter (Signed)
Attempted again to contact pt by phone to relay Dr. Timoteo ExposeG's instructions.  Line still busy.  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  Mailed a letter with Dr. Timoteo ExposeG's instructions for pt.

## 2017-10-16 ENCOUNTER — Encounter: Payer: Self-pay | Admitting: Family Medicine

## 2017-10-23 ENCOUNTER — Encounter: Payer: Self-pay | Admitting: Dietician

## 2017-10-23 ENCOUNTER — Encounter: Payer: 59 | Attending: Family Medicine | Admitting: Dietician

## 2017-10-23 VITALS — Ht 76.0 in | Wt 304.4 lb

## 2017-10-23 DIAGNOSIS — Z713 Dietary counseling and surveillance: Secondary | ICD-10-CM | POA: Insufficient documentation

## 2017-10-23 DIAGNOSIS — E1140 Type 2 diabetes mellitus with diabetic neuropathy, unspecified: Secondary | ICD-10-CM | POA: Insufficient documentation

## 2017-10-23 DIAGNOSIS — E1165 Type 2 diabetes mellitus with hyperglycemia: Secondary | ICD-10-CM | POA: Insufficient documentation

## 2017-10-23 NOTE — Progress Notes (Signed)

## 2017-10-30 ENCOUNTER — Encounter: Payer: 59 | Admitting: Dietician

## 2017-10-30 ENCOUNTER — Encounter: Payer: Self-pay | Admitting: Dietician

## 2017-10-30 VITALS — Wt 301.7 lb

## 2017-10-30 DIAGNOSIS — Z713 Dietary counseling and surveillance: Secondary | ICD-10-CM | POA: Diagnosis not present

## 2017-10-30 DIAGNOSIS — E1165 Type 2 diabetes mellitus with hyperglycemia: Secondary | ICD-10-CM

## 2017-10-30 NOTE — Progress Notes (Signed)

## 2017-11-06 ENCOUNTER — Encounter: Payer: Self-pay | Admitting: Dietician

## 2017-11-06 ENCOUNTER — Encounter: Payer: 59 | Admitting: Dietician

## 2017-11-06 VITALS — BP 138/90 | Ht 76.0 in | Wt 298.8 lb

## 2017-11-06 DIAGNOSIS — Z713 Dietary counseling and surveillance: Secondary | ICD-10-CM | POA: Diagnosis not present

## 2017-11-06 DIAGNOSIS — E1165 Type 2 diabetes mellitus with hyperglycemia: Secondary | ICD-10-CM

## 2017-11-06 NOTE — Progress Notes (Signed)

## 2017-11-08 ENCOUNTER — Encounter: Payer: Self-pay | Admitting: Family Medicine

## 2017-11-08 MED ORDER — CITALOPRAM HYDROBROMIDE 20 MG PO TABS: 20.0000 mg | ORAL_TABLET | Freq: Every day | ORAL | 4 refills | Status: DC

## 2017-11-14 ENCOUNTER — Encounter: Payer: Self-pay | Admitting: *Deleted

## 2017-11-24 ENCOUNTER — Other Ambulatory Visit: Payer: Self-pay

## 2017-11-24 MED ORDER — METFORMIN HCL 1000 MG PO TABS: 1000.0000 mg | ORAL_TABLET | Freq: Two times a day (BID) | ORAL | 0 refills | Status: DC

## 2017-11-24 NOTE — Telephone Encounter (Signed)
Received faxed 90 day rx request for metformin 1,000 mg tab.  Sent new rx.

## 2017-12-04 ENCOUNTER — Ambulatory Visit: Payer: Self-pay

## 2017-12-22 ENCOUNTER — Encounter: Payer: Self-pay | Admitting: Family Medicine

## 2017-12-22 ENCOUNTER — Ambulatory Visit: Payer: 59 | Admitting: Family Medicine

## 2017-12-22 VITALS — BP 132/80 | HR 82 | Temp 98.2°F | Wt 296.0 lb

## 2017-12-22 DIAGNOSIS — E1165 Type 2 diabetes mellitus with hyperglycemia: Secondary | ICD-10-CM | POA: Diagnosis not present

## 2017-12-22 DIAGNOSIS — E1140 Type 2 diabetes mellitus with diabetic neuropathy, unspecified: Secondary | ICD-10-CM

## 2017-12-22 DIAGNOSIS — IMO0002 Reserved for concepts with insufficient information to code with codable children: Secondary | ICD-10-CM

## 2017-12-22 LAB — POCT GLYCOSYLATED HEMOGLOBIN (HGB A1C): Hemoglobin A1C: 7

## 2017-12-22 NOTE — Assessment & Plan Note (Signed)
Chronic, improved readings. Anticipate significant improvement with healthy diet and lifestyle changes to date! Pt feels these changes are sustainable. Check POC A1c today.

## 2017-12-22 NOTE — Assessment & Plan Note (Signed)
Pt motivated for sustainable diet and lifestyle changes. Congratulated on 14 lb weight loss over the past 3 months!

## 2017-12-22 NOTE — Progress Notes (Signed)
BP 132/80 (BP Location: Left Arm, Patient Position: Sitting, Cuff Size: Large)   Pulse 82   Temp 98.2 F (36.8 C) (Oral)   Wt 296 lb (134.3 kg)   SpO2 96%   BMI 36.03 kg/m    CC: DM f/u visit Subjective:    Patient ID: James [name], male    DOB: [birth year], 37 y.o.   MRN: [id number]  HPI: James [name] is a 37 y.o. male presenting on 12/22/2017 for Diabetes (Here for follow-up.)   DM - does regularly check sugars: brings log this week - morning fasting 118-139, PM postprandial 110s. Compliant with antihyperglycemic regimen which includes: metformin 1000mg  bid, amaryl 2mg  daily with breakfast. Denies low sugars or hypoglycemic symptoms. Denies paresthesias. Last diabetic eye exam 12/2016. Pneumovax: 2015. Prevnar: not due. Glucometer brand: bayer contour next. DSME: completed 10/2017 - at [facility]. Started walking more regularly, preparing meals and no more fast food. 14 lbs weight loss since last visit.  Lab Results  Component Value Date   HGBA1C 7.0 12/22/2017   Diabetic Foot Exam - Simple   No data filed     Lab Results  Component Value Date   MICROALBUR 0.9 05/25/2017     Relevant past medical, surgical, family and social history reviewed and updated as indicated. Interim medical history since our last visit reviewed. Allergies and medications reviewed and updated. Outpatient Medications Prior to Visit  Medication Sig Dispense Refill  . aspirin EC 81 MG tablet Take 81 mg by mouth daily.    Marland Kitchen atenolol (TENORMIN) 100 MG tablet TAKE 1 TABLET (100 MG TOTAL) BY MOUTH DAILY. 90 tablet 2  . citalopram (CELEXA) 20 MG tablet Take 1 tablet (20 mg total) by mouth daily. 30 tablet 4  . glimepiride (AMARYL) 4 MG tablet Take 1 tablet (4 mg total) by mouth daily with breakfast. 30 tablet 6  . metFORMIN (GLUCOPHAGE) 1000 MG tablet Take 1 tablet (1,000 mg total) by mouth 2 (two) times daily with a meal. 180 tablet 0  . Omega-3 Fatty Acids (FISH OIL PO) Take 3 capsules by mouth daily.      . pravastatin (PRAVACHOL) 40 MG tablet Take 1 tablet (40 mg total) by mouth at bedtime. 90 tablet 3  . glucose blood (BAYER CONTOUR NEXT TEST) test strip Use as instructed 100 each 12  . hydrOXYzine (ATARAX/VISTARIL) 25 MG tablet TAKE 0.5-1 TABLETS (12.5-25 MG TOTAL) BY MOUTH 2 (TWO) TIMES DAILY AS NEEDED FOR ANXIETY. 30 tablet 3   No facility-administered medications prior to visit.      Per HPI unless specifically indicated in ROS section below Review of Systems     Objective:    BP 132/80 (BP Location: Left Arm, Patient Position: Sitting, Cuff Size: Large)   Pulse 82   Temp 98.2 F (36.8 C) (Oral)   Wt 296 lb (134.3 kg)   SpO2 96%   BMI 36.03 kg/m   Wt Readings from Last 3 Encounters:  12/22/17 296 lb (134.3 kg)  11/06/17 298 lb 12.8 oz (135.5 kg)  10/30/17 (!) 301 lb 11.2 oz (136.9 kg)    Physical Exam  Constitutional: He appears well-developed and well-nourished. No distress.  HENT:  Head: Normocephalic and atraumatic.  Right Ear: External ear normal.  Left Ear: External ear normal.  Nose: Nose normal.  Mouth/Throat: Oropharynx is clear and moist. No oropharyngeal exudate.  Eyes: Pupils are equal, round, and reactive to light. Conjunctivae and EOM are normal. No scleral icterus.  Neck: Normal range of motion. Neck  supple.  Cardiovascular: Normal rate, regular rhythm, normal heart sounds and intact distal pulses.  No murmur heard. Pulmonary/Chest: Effort normal and breath sounds normal. No respiratory distress. He has no wheezes. He has no rales.  Musculoskeletal: He exhibits no edema.  See HPI for foot exam if done  Lymphadenopathy:    He has no cervical adenopathy.  Skin: Skin is warm and dry. No rash noted.  Psychiatric: He has a normal mood and affect.  Nursing note and vitals reviewed.  Results for orders placed or performed in visit on 12/22/17  POCT glycosylated hemoglobin (Hb A1C)  Result Value Ref Range   Hemoglobin A1C 7.0       Assessment & Plan:    Problem List Items Addressed This Visit    Severe obesity (BMI 35.0-39.9) with comorbidity (HCC)    Pt motivated for sustainable diet and lifestyle changes. Congratulated on 14 lb weight loss over the past 3 months!       Type 2 diabetes, uncontrolled, with neuropathy (HCC) - Primary    Chronic, improved readings. Anticipate significant improvement with healthy diet and lifestyle changes to date! Pt feels these changes are sustainable. Check POC A1c today.       Relevant Orders   POCT glycosylated hemoglobin (Hb A1C) (Completed)       No orders of the defined types were placed in this encounter.  Orders Placed This Encounter  Procedures  . POCT glycosylated hemoglobin (Hb A1C)    Follow up plan: Return in about 5 months (around 05/24/2018) for annual exam, prior fasting for blood work.  Eustaquio BoydenJavier Idalie Canto, MD

## 2017-12-22 NOTE — Patient Instructions (Addendum)
Congratulations on healthy changes to date! Continue current medications and healthy diet and walking routine.  Return after 05/29/2018 for physical.

## 2018-01-01 ENCOUNTER — Telehealth: Payer: Self-pay

## 2018-01-01 NOTE — Telephone Encounter (Signed)
Copied from CRM [id number]. Topic: Quick Communication - Office Called Patient >> Jan 01, 2018  3:14 PM James [name], James [name], CMA wrote: Reason for CRM: Received a fax from [facility] stating his Pravastatin costs $45 every 3 months but Lovastatin costs $25 every 3 months. I am not sure if he is able to take simvastatin or if he would go to [facility], but simvastatin is $10 for 90 days.   PEC, if pt calls back, please ask him if he wants to do lovastatin. We will have to get with Dr Sharen HonesGutierrez about simvastatin [facility] >> Jan 01, 2018  4:00 PM James [name], James [name], RMA wrote: Patient called back and stated he will do the Lovastatin

## 2018-01-01 NOTE — Telephone Encounter (Signed)
>>   Jan 01, 2018  3:14 PM Eual FinesBridges, Jamani P, CMA wrote: Reason for CRM: Received a fax from [facility] stating his Pravastatin costs $45 every 3 months but Lovastatin costs $25 every 3 months. I am not sure if he is able to take simvastatin or if he would go to [facility], but simvastatin is $10 for 90 days.   PEC, if pt calls back, please ask him if he wants to do lovastatin. We will have to get with Dr Sharen HonesGutierrez about simvastatin [facility] >> Jan 01, 2018  4:00 PM Cecelia ByarsGreen, Temeka L, RMA wrote: Patient called back and stated he will do the Lovastatin

## 2018-01-01 NOTE — Telephone Encounter (Signed)
Received a fax from [facility] stating his Pravastatin was $45 for 3 months and Lovastatin is $25 for 3 months. Wanted to ,ake a change. [facility], [facility]. [facility] pt [facility] Reece [facility]arG was willing to change to Simvastatin. He has no allergy to it according to the chart.

## 2018-01-02 MED ORDER — LOVASTATIN 40 MG PO TABS: 40.0000 mg | ORAL_TABLET | Freq: Every day | ORAL | 3 refills | Status: DC

## 2018-01-02 NOTE — Telephone Encounter (Signed)
plz notify I have sent in lovastatin to take in place of pravastatin.

## 2018-01-02 NOTE — Telephone Encounter (Signed)
Spoke with pt relaying Dr. Timoteo ExposeG's message.  Pt verbalizes understanding and states after this [facility] [address]  [Updated pt's pharmacy list.]

## 2018-02-19 ENCOUNTER — Other Ambulatory Visit: Payer: Self-pay

## 2018-02-22 ENCOUNTER — Ambulatory Visit: Payer: Self-pay | Admitting: Family Medicine

## 2018-03-21 ENCOUNTER — Other Ambulatory Visit: Payer: Self-pay | Admitting: Family Medicine

## 2018-04-29 ENCOUNTER — Other Ambulatory Visit: Payer: Self-pay | Admitting: Family Medicine

## 2018-05-24 ENCOUNTER — Other Ambulatory Visit: Payer: Self-pay | Admitting: Family Medicine

## 2018-05-29 ENCOUNTER — Encounter: Payer: Self-pay | Admitting: *Deleted

## 2018-06-10 ENCOUNTER — Other Ambulatory Visit: Payer: Self-pay | Admitting: Family Medicine

## 2018-06-10 DIAGNOSIS — E1165 Type 2 diabetes mellitus with hyperglycemia: Principal | ICD-10-CM

## 2018-06-10 DIAGNOSIS — E782 Mixed hyperlipidemia: Secondary | ICD-10-CM

## 2018-06-10 DIAGNOSIS — E1140 Type 2 diabetes mellitus with diabetic neuropathy, unspecified: Secondary | ICD-10-CM

## 2018-06-10 DIAGNOSIS — E538 Deficiency of other specified B group vitamins: Secondary | ICD-10-CM

## 2018-06-10 DIAGNOSIS — IMO0002 Reserved for concepts with insufficient information to code with codable children: Secondary | ICD-10-CM

## 2018-06-12 ENCOUNTER — Telehealth: Payer: Self-pay

## 2018-06-12 ENCOUNTER — Encounter: Payer: Self-pay | Admitting: Family Medicine

## 2018-06-12 ENCOUNTER — Other Ambulatory Visit (INDEPENDENT_AMBULATORY_CARE_PROVIDER_SITE_OTHER): Payer: 59

## 2018-06-12 DIAGNOSIS — E1165 Type 2 diabetes mellitus with hyperglycemia: Secondary | ICD-10-CM | POA: Diagnosis not present

## 2018-06-12 DIAGNOSIS — E782 Mixed hyperlipidemia: Secondary | ICD-10-CM

## 2018-06-12 DIAGNOSIS — E1140 Type 2 diabetes mellitus with diabetic neuropathy, unspecified: Secondary | ICD-10-CM

## 2018-06-12 DIAGNOSIS — E538 Deficiency of other specified B group vitamins: Secondary | ICD-10-CM | POA: Diagnosis not present

## 2018-06-12 DIAGNOSIS — IMO0002 Reserved for concepts with insufficient information to code with codable children: Secondary | ICD-10-CM

## 2018-06-12 LAB — LIPID PANEL
CHOL/HDL RATIO: 6
Cholesterol: 156 mg/dL (ref 0–200)
HDL: 24.4 mg/dL — AB (ref >39.00)

## 2018-06-12 LAB — COMPREHENSIVE METABOLIC PANEL
ALK PHOS: 64 U/L (ref 39–117)
ALT: 39 U/L (ref 0–53)
AST: 24 U/L (ref 0–37)
Albumin: 4.7 g/dL (ref 3.5–5.2)
BUN: 15 mg/dL (ref 6–23)
CO2: 26 mEq/L (ref 19–32)
Calcium: 9.6 mg/dL (ref 8.4–10.5)
Chloride: 99 mEq/L (ref 96–112)
Creatinine, Ser: 0.98 mg/dL (ref 0.40–1.50)
GFR: 91.08 mL/min (ref >60.00)
GLUCOSE: 164 mg/dL — AB (ref 70–99)
Potassium: 4.2 mEq/L (ref 3.5–5.1)
SODIUM: 136 meq/L (ref 135–145)
TOTAL PROTEIN: 8 g/dL (ref 6.0–8.3)
Total Bilirubin: 0.5 mg/dL (ref 0.2–1.2)

## 2018-06-12 LAB — VITAMIN B12: Vitamin B-12: 217 pg/mL (ref 211–911)

## 2018-06-12 LAB — MICROALBUMIN / CREATININE URINE RATIO
Creatinine,U: 230.4 mg/dL
Microalb Creat Ratio: 0.8 mg/g (ref 0.0–30.0)
Microalb, Ur: 2 mg/dL — ABNORMAL HIGH (ref 0.0–1.9)

## 2018-06-12 LAB — HEMOGLOBIN A1C: Hgb A1c MFr Bld: 7.1 % — ABNORMAL HIGH (ref 4.6–6.5)

## 2018-06-12 LAB — LDL CHOLESTEROL, DIRECT: Direct LDL: 82 mg/dL

## 2018-06-12 MED ORDER — METFORMIN HCL 1000 MG PO TABS: 1000.0000 mg | ORAL_TABLET | Freq: Two times a day (BID) | ORAL | 0 refills | Status: DC

## 2018-06-12 NOTE — Telephone Encounter (Signed)
Updated pt's pharmacy list in chart per pt request. Also, sent refill to [facility].

## 2018-06-12 NOTE — Telephone Encounter (Addendum)
Sent refill to [facility] [address] per pt request. [See Pt Msg, 06/12/18.]  Notified pt via MyChart.

## 2018-06-15 ENCOUNTER — Encounter: Payer: Self-pay | Admitting: Family Medicine

## 2018-06-15 ENCOUNTER — Ambulatory Visit (INDEPENDENT_AMBULATORY_CARE_PROVIDER_SITE_OTHER): Payer: 59 | Admitting: Family Medicine

## 2018-06-15 VITALS — BP 122/72 | HR 84 | Temp 98.2°F | Ht 76.0 in | Wt 296.0 lb

## 2018-06-15 DIAGNOSIS — E1140 Type 2 diabetes mellitus with diabetic neuropathy, unspecified: Secondary | ICD-10-CM

## 2018-06-15 DIAGNOSIS — E1165 Type 2 diabetes mellitus with hyperglycemia: Secondary | ICD-10-CM

## 2018-06-15 DIAGNOSIS — Z0000 Encounter for general adult medical examination without abnormal findings: Secondary | ICD-10-CM

## 2018-06-15 DIAGNOSIS — E782 Mixed hyperlipidemia: Secondary | ICD-10-CM

## 2018-06-15 DIAGNOSIS — F331 Major depressive disorder, recurrent, moderate: Secondary | ICD-10-CM

## 2018-06-15 DIAGNOSIS — I10 Essential (primary) hypertension: Secondary | ICD-10-CM

## 2018-06-15 DIAGNOSIS — K760 Fatty (change of) liver, not elsewhere classified: Secondary | ICD-10-CM

## 2018-06-15 DIAGNOSIS — E538 Deficiency of other specified B group vitamins: Secondary | ICD-10-CM

## 2018-06-15 DIAGNOSIS — IMO0002 Reserved for concepts with insufficient information to code with codable children: Secondary | ICD-10-CM

## 2018-06-15 MED ORDER — CYANOCOBALAMIN 500 MCG PO TABS: 500.0000 ug | ORAL_TABLET | Freq: Every day | ORAL | Status: AC

## 2018-06-15 NOTE — Assessment & Plan Note (Signed)
Chronic, stable. Continue current regimen. 

## 2018-06-15 NOTE — Assessment & Plan Note (Signed)
Encouraged ongoing efforts at healthy diet and lifestyle changes to affect sustainable weight loss.  

## 2018-06-15 NOTE — Assessment & Plan Note (Signed)
rec restart 500 mcg OTC supplement daily.

## 2018-06-15 NOTE — Patient Instructions (Signed)
Triglycerides were too high - possibly from meal the night before blood work. - we will recheck at next labs.  Vitamin B12 was low - restart supplement daily OTC.   Health Maintenance, Male A healthy lifestyle and preventive care is important for your health and wellness. Ask your health care provider about what schedule of regular examinations is right for you. What should I know about weight and diet? Eat a Healthy Diet  Eat plenty of vegetables, fruits, whole grains, low-fat dairy products, and lean protein.  Do not eat a lot of foods high in solid fats, added sugars, or salt.  Maintain a Healthy Weight Regular exercise can help you achieve or maintain a healthy weight. You should:  Do at least 150 minutes of exercise each week. The exercise should increase your heart rate and make you sweat (moderate-intensity exercise).  Do strength-training exercises at least twice a week.  Watch Your Levels of Cholesterol and Blood Lipids  Have your blood tested for lipids and cholesterol every 5 years starting at 38 years of age. If you are at high risk for heart disease, you should start having your blood tested when you are 38 years old. You may need to have your cholesterol levels checked more often if: ? Your lipid or cholesterol levels are high. ? You are older than 38 years of age. ? You are at high risk for heart disease.  What should I know about cancer screening? Many types of cancers can be detected early and may often be prevented. Lung Cancer  You should be screened every year for lung cancer if: ? You are a current smoker who has smoked for at least 30 years. ? You are a former smoker who has quit within the past 15 years.  Talk to your health care provider about your screening options, when you should start screening, and how often you should be screened.  Colorectal Cancer  Routine colorectal cancer screening usually begins at 38 years of age and should be repeated  every 5-10 years until you are 38 years old. You may need to be screened more often if early forms of precancerous polyps or small growths are found. Your health care provider may recommend screening at an earlier age if you have risk factors for colon cancer.  Your health care provider may recommend using home test kits to check for hidden blood in the stool.  A small camera at the end of a tube can be used to examine your colon (sigmoidoscopy or colonoscopy). This checks for the earliest forms of colorectal cancer.  Prostate and Testicular Cancer  Depending on your age and overall health, your health care provider may do certain tests to screen for prostate and testicular cancer.  Talk to your health care provider about any symptoms or concerns you have about testicular or prostate cancer.  Skin Cancer  Check your skin from head to toe regularly.  Tell your health care provider about any new moles or changes in moles, especially if: ? There is a change in a mole's size, shape, or color. ? You have a mole that is larger than a pencil eraser.  Always use sunscreen. Apply sunscreen liberally and repeat throughout the day.  Protect yourself by wearing long sleeves, pants, a wide-brimmed hat, and sunglasses when outside.  What should I know about heart disease, diabetes, and high blood pressure?  If you are 56-81 years of age, have your blood pressure checked every 3-5 years. If you  are 40 years of age or older, have your blood pressure checked every year. You should have your blood pressure measured twice-once when you are at a hospital or clinic, and once when you are not at a hospital or clinic. Record the average of the two measurements. To check your blood pressure when you are not at a hospital or clinic, you can use: ? An automated blood pressure machine at a pharmacy. ? A home blood pressure monitor.  Talk to your health care provider about your target blood pressure.  If you are  between 45-79 years old, ask your health care provider if you should take aspirin to prevent heart disease.  Have regular diabetes screenings by checking your fasting blood sugar level. ? If you are at a normal weight and have a low risk for diabetes, have this test once every three years after the age of 45. ? If you are overweight and have a high risk for diabetes, consider being tested at a younger age or more often.  A one-time screening for abdominal aortic aneurysm (AAA) by ultrasound is recommended for men aged 65-75 years who are current or former smokers. What should I know about preventing infection? Hepatitis B If you have a higher risk for hepatitis B, you should be screened for this virus. Talk with your health care provider to find out if you are at risk for hepatitis B infection. Hepatitis C Blood testing is recommended for:  Everyone born from 1945 through 1965.  Anyone with known risk factors for hepatitis C.  Sexually Transmitted Diseases (STDs)  You should be screened each year for STDs including gonorrhea and chlamydia if: ? You are sexually active and are younger than 38 years of age. ? You are older than 38 years of age and your health care provider tells you that you are at risk for this type of infection. ? Your sexual activity has changed since you were last screened and you are at an increased risk for chlamydia or gonorrhea. Ask your health care provider if you are at risk.  Talk with your health care provider about whether you are at high risk of being infected with HIV. Your health care provider may recommend a prescription medicine to help prevent HIV infection.  What else can I do?  Schedule regular health, dental, and eye exams.  Stay current with your vaccines (immunizations).  Do not use any tobacco products, such as cigarettes, chewing tobacco, and e-cigarettes. If you need help quitting, ask your health care provider.  Limit alcohol intake to no  more than 2 drinks per day. One drink equals 12 ounces of beer, 5 ounces of wine, or 1 ounces of hard liquor.  Do not use street drugs.  Do not share needles.  Ask your health care provider for help if you need support or information about quitting drugs.  Tell your health care provider if you often feel depressed.  Tell your health care provider if you have ever been abused or do not feel safe at home. This information is not intended to replace advice given to you by your health care provider. Make sure you discuss any questions you have with your health care provider. Document Released: 03/03/2008 Document Revised: 05/04/2016 Document Reviewed: 06/09/2015 Elsevier Interactive Patient Education  2018 [employer]  

## 2018-06-15 NOTE — Assessment & Plan Note (Addendum)
Chronic, deteriorated predominantly triglycerides - pt attributes to possible fatty meal the night prior. Will recheck at f/u visit.  The ASCVD Risk score Denman George DC Jr., et al., 2013) failed to calculate for the following reasons:   The 2013 ASCVD risk score is only valid for ages 80 to 3

## 2018-06-15 NOTE — Assessment & Plan Note (Signed)
Stable period on celexa. PHQ2 = 0

## 2018-06-15 NOTE — Assessment & Plan Note (Signed)
Chronic, stable. Continue atenolol. Consider changing to ACEI.

## 2018-06-15 NOTE — Assessment & Plan Note (Signed)
Preventative protocols reviewed and updated unless pt declined. Discussed healthy diet and lifestyle.  

## 2018-06-15 NOTE — Assessment & Plan Note (Signed)
LFTs stable.

## 2018-06-15 NOTE — Progress Notes (Signed)
BP 122/72 (BP Location: Left Arm, Patient Position: Sitting, Cuff Size: Large)   Pulse 84   Temp 98.2 F (36.8 C) (Oral)   Ht 6\' 4"  (1.93 m)   Wt 296 lb (134.3 kg)   SpO2 97%   BMI 36.03 kg/m    CC: CPE Subjective:    Patient ID: James [name], male    DOB: [birth year], 37 y.o.   MRN: [id number]  HPI: James [name] is a 37 y.o. male presenting on 06/15/2018 for Annual Exam   Preventative: Flu shot yearly Tdap 04/2014 Pneumovax 05/2014 Seat belt use discussed Sunscreen use discussed. No changing moles on skin. Non smoker Alcohol - none Dentist Q6 mo Eye exam yearly  Lives alone Occupation: works at [employer] old [employer] Edu: AA  Activity: 30 min/day 3x/wk on treadmill as well as free weights.  Diet: good water, fruits/vegetables daily, avoids soda and fast foods   Relevant past medical, surgical, family and social history reviewed and updated as indicated. Interim medical history since our last visit reviewed. Allergies and medications reviewed and updated. Outpatient Medications Prior to Visit  Medication Sig Dispense Refill  . aspirin EC 81 MG tablet Take 81 mg by mouth daily.    Marland Kitchen atenolol (TENORMIN) 100 MG tablet TAKE 1 TABLET BY MOUTH ONCE DAILY 180 tablet 0  . citalopram (CELEXA) 20 MG tablet TAKE 1 TABLET BY MOUTH ONCE DAILY 30 tablet 6  . glimepiride (AMARYL) 4 MG tablet Take 1 tablet (4 mg total) by mouth daily with breakfast. 30 tablet 6  . lovastatin (MEVACOR) 40 MG tablet Take 1 tablet (40 mg total) by mouth at bedtime. 90 tablet 3  . metFORMIN (GLUCOPHAGE) 1000 MG tablet Take 1 tablet (1,000 mg total) by mouth 2 (two) times daily with a meal. 180 tablet 0  . Omega-3 Fatty Acids (FISH OIL PO) Take 3 capsules by mouth daily.      No facility-administered medications prior to visit.      Per HPI unless specifically indicated in ROS section below Review of Systems  Constitutional: Negative for activity change, appetite change, chills, fatigue,  fever and unexpected weight change.  HENT: Negative for hearing loss.   Eyes: Negative for visual disturbance.  Respiratory: Negative for cough, chest tightness, shortness of breath and wheezing.   Cardiovascular: Negative for chest pain, palpitations and leg swelling.  Gastrointestinal: Negative for abdominal distention, abdominal pain, blood in stool, constipation, diarrhea, nausea and vomiting.  Genitourinary: Negative for difficulty urinating and hematuria.  Musculoskeletal: Negative for arthralgias, myalgias and neck pain.  Skin: Negative for rash.  Neurological: Negative for dizziness, seizures, syncope and headaches.  Hematological: Negative for adenopathy. Does not bruise/bleed easily.  Psychiatric/Behavioral: Negative for dysphoric mood. The patient is not nervous/anxious.        Objective:    BP 122/72 (BP Location: Left Arm, Patient Position: Sitting, Cuff Size: Large)   Pulse 84   Temp 98.2 F (36.8 C) (Oral)   Ht 6\' 4"  (1.93 m)   Wt 296 lb (134.3 kg)   SpO2 97%   BMI 36.03 kg/m   Wt Readings from Last 3 Encounters:  06/15/18 296 lb (134.3 kg)  12/22/17 296 lb (134.3 kg)  11/06/17 298 lb 12.8 oz (135.5 kg)    Physical Exam  Constitutional: He is oriented to person, place, and time. He appears well-developed and well-nourished. No distress.  HENT:  Head: Normocephalic and atraumatic.  Right Ear: Hearing, tympanic membrane, external ear and ear canal normal.  Left  Ear: Hearing, tympanic membrane, external ear and ear canal normal.  Nose: Nose normal.  Mouth/Throat: Uvula is midline, oropharynx is clear and moist and mucous membranes are normal. No oropharyngeal exudate, posterior oropharyngeal edema or posterior oropharyngeal erythema.  Eyes: Pupils are equal, round, and reactive to light. Conjunctivae and EOM are normal. No scleral icterus.  Neck: Normal range of motion. Neck supple.  Cardiovascular: Normal rate, regular rhythm, normal heart sounds and intact  distal pulses.  No murmur heard. Pulses:      Radial pulses are 2+ on the right side, and 2+ on the left side.  Pulmonary/Chest: Effort normal and breath sounds normal. No respiratory distress. He has no wheezes. He has no rales.  Abdominal: Soft. Bowel sounds are normal. He exhibits no distension and no mass. There is no tenderness. There is no rebound and no guarding.  Musculoskeletal: Normal range of motion. He exhibits no edema.  Lymphadenopathy:    He has no cervical adenopathy.  Neurological: He is alert and oriented to person, place, and time.  CN grossly intact, station and gait intact  Skin: Skin is warm and dry. No rash noted.  Psychiatric: He has a normal mood and affect. His behavior is normal. Judgment and thought content normal.  Nursing note and vitals reviewed.  Results for orders placed or performed in visit on 06/12/18  Vitamin B12  Result Value Ref Range   Vitamin B-12 217 211 - 911 pg/mL  Microalbumin / creatinine urine ratio  Result Value Ref Range   Microalb, Ur 2.0 (H) 0.0 - 1.9 mg/dL   Creatinine,U 161.0 mg/dL   Microalb Creat Ratio 0.8 0.0 - 30.0 mg/g  Hemoglobin A1c  Result Value Ref Range   Hgb A1c MFr Bld 7.1 (H) 4.6 - 6.5 %  Comprehensive metabolic panel  Result Value Ref Range   Sodium 136 135 - 145 mEq/L   Potassium 4.2 3.5 - 5.1 mEq/L   Chloride 99 96 - 112 mEq/L   CO2 26 19 - 32 mEq/L   Glucose, Bld 164 (H) 70 - 99 mg/dL   BUN 15 6 - 23 mg/dL   Creatinine, Ser 9.60 0.40 - 1.50 mg/dL   Total Bilirubin 0.5 0.2 - 1.2 mg/dL   Alkaline Phosphatase 64 39 - 117 U/L   AST 24 0 - 37 U/L   ALT 39 0 - 53 U/L   Total Protein 8.0 6.0 - 8.3 g/dL   Albumin 4.7 3.5 - 5.2 g/dL   Calcium 9.6 8.4 - 45.4 mg/dL   GFR 09.81 >19.14 mL/min  Lipid panel  Result Value Ref Range   Cholesterol 156 0 - 200 mg/dL   Triglycerides (H) 0.0 - 149.0 mg/dL    782.9 Triglyceride is over 400; calculations on Lipids are invalid.   HDL 24.40 (L) >39.00 mg/dL   Total  CHOL/HDL Ratio 6   LDL cholesterol, direct  Result Value Ref Range   Direct LDL 82.0 mg/dL   Depression screen [facility] 2/9 06/15/2018 09/28/2017 10/28/2016 01/11/2013  Decreased Interest 0 0 0 0  Down, Depressed, Hopeless 0 0 1 0  PHQ - 2 Score 0 0 1 0  Altered sleeping - - 2 -  Tired, decreased energy - - 1 -  Change in appetite - - 2 -  Feeling bad or failure about yourself  - - 1 -  Trouble concentrating - - 1 -  Moving slowly or fidgety/restless - - 0 -  Suicidal thoughts - - 0 -  PHQ-9 Score - -  8 -       Assessment & Plan:   Problem List Items Addressed This Visit    Vitamin B12 deficiency    rec restart 500 mcg OTC supplement daily.       Type 2 diabetes, uncontrolled, with neuropathy (HCC)    Chronic, stable. Continue current regimen.       Severe obesity (BMI 35.0-39.9) with comorbidity (HCC)    Encouraged ongoing efforts at healthy diet and lifestyle changes to affect sustainable weight loss.       NAFLD (nonalcoholic fatty liver disease)    LFTs stable.       Mixed hyperlipidemia    Chronic, deteriorated predominantly triglycerides - pt attributes to possible fatty meal the night prior. Will recheck at f/u visit.  The ASCVD Risk score Denman George DC Jr., et al., 2013) failed to calculate for the following reasons:   The 2013 ASCVD risk score is only valid for ages 67 to 24       Major depressive disorder, recurrent episode, moderate (HCC)    Stable period on celexa. PHQ2 = 0      HTN (hypertension)    Chronic, stable. Continue atenolol. Consider changing to ACEI.       Health maintenance examination - Primary    Preventative protocols reviewed and updated unless pt declined. Discussed healthy diet and lifestyle.           Meds ordered this encounter  Medications  . vitamin B-12 (V-R VITAMIN B-12) 500 MCG tablet    Sig: Take 1 tablet (500 mcg total) by mouth daily.   No orders of the defined types were placed in this encounter.   Follow up plan: Return  in about 4 months (around 10/15/2018) for follow up visit.  [name], MD

## 2018-07-04 ENCOUNTER — Other Ambulatory Visit: Payer: Self-pay | Admitting: Family Medicine

## 2018-09-11 ENCOUNTER — Other Ambulatory Visit: Payer: Self-pay | Admitting: Family Medicine

## 2018-10-19 ENCOUNTER — Ambulatory Visit: Payer: Self-pay | Admitting: Family Medicine

## 2018-12-10 ENCOUNTER — Other Ambulatory Visit: Payer: Self-pay | Admitting: Family Medicine

## 2019-02-15 ENCOUNTER — Other Ambulatory Visit: Payer: Self-pay | Admitting: Family Medicine

## 2019-02-25 ENCOUNTER — Ambulatory Visit: Payer: Self-pay | Admitting: Family Medicine

## 2019-02-25 DIAGNOSIS — H35032 Hypertensive retinopathy, left eye: Secondary | ICD-10-CM | POA: Diagnosis not present

## 2019-02-25 DIAGNOSIS — Z7984 Long term (current) use of oral hypoglycemic drugs: Secondary | ICD-10-CM | POA: Diagnosis not present

## 2019-02-25 DIAGNOSIS — H5203 Hypermetropia, bilateral: Secondary | ICD-10-CM | POA: Diagnosis not present

## 2019-02-25 DIAGNOSIS — E119 Type 2 diabetes mellitus without complications: Secondary | ICD-10-CM | POA: Diagnosis not present

## 2019-02-25 LAB — HM DIABETES EYE EXAM

## 2019-03-01 ENCOUNTER — Encounter: Payer: Self-pay | Admitting: Family Medicine

## 2019-03-01 DIAGNOSIS — H35032 Hypertensive retinopathy, left eye: Secondary | ICD-10-CM | POA: Insufficient documentation

## 2019-04-01 ENCOUNTER — Other Ambulatory Visit: Payer: Self-pay | Admitting: Family Medicine

## 2019-04-17 ENCOUNTER — Encounter: Payer: Self-pay | Admitting: Family Medicine

## 2019-04-17 ENCOUNTER — Ambulatory Visit (INDEPENDENT_AMBULATORY_CARE_PROVIDER_SITE_OTHER): Payer: BC Managed Care – PPO | Admitting: Family Medicine

## 2019-04-17 ENCOUNTER — Other Ambulatory Visit: Payer: Self-pay

## 2019-04-17 VITALS — BP 120/70 | HR 78 | Temp 98.2°F | Ht 76.0 in | Wt 282.1 lb

## 2019-04-17 DIAGNOSIS — K760 Fatty (change of) liver, not elsewhere classified: Secondary | ICD-10-CM

## 2019-04-17 DIAGNOSIS — E1165 Type 2 diabetes mellitus with hyperglycemia: Secondary | ICD-10-CM | POA: Diagnosis not present

## 2019-04-17 DIAGNOSIS — I10 Essential (primary) hypertension: Secondary | ICD-10-CM

## 2019-04-17 DIAGNOSIS — E782 Mixed hyperlipidemia: Secondary | ICD-10-CM

## 2019-04-17 DIAGNOSIS — Z0000 Encounter for general adult medical examination without abnormal findings: Secondary | ICD-10-CM

## 2019-04-17 DIAGNOSIS — F331 Major depressive disorder, recurrent, moderate: Secondary | ICD-10-CM

## 2019-04-17 DIAGNOSIS — E538 Deficiency of other specified B group vitamins: Secondary | ICD-10-CM

## 2019-04-17 DIAGNOSIS — IMO0002 Reserved for concepts with insufficient information to code with codable children: Secondary | ICD-10-CM

## 2019-04-17 DIAGNOSIS — E1140 Type 2 diabetes mellitus with diabetic neuropathy, unspecified: Secondary | ICD-10-CM | POA: Diagnosis not present

## 2019-04-17 DIAGNOSIS — E669 Obesity, unspecified: Secondary | ICD-10-CM

## 2019-04-17 LAB — POCT GLYCOSYLATED HEMOGLOBIN (HGB A1C): Hemoglobin A1C: 8.6 % — AB (ref 4.0–5.6)

## 2019-04-17 MED ORDER — ATENOLOL 100 MG PO TABS: 100.0000 mg | ORAL_TABLET | Freq: Every day | ORAL | 3 refills | Status: DC

## 2019-04-17 MED ORDER — CITALOPRAM HYDROBROMIDE 20 MG PO TABS: 20.0000 mg | ORAL_TABLET | Freq: Every day | ORAL | 3 refills | Status: DC

## 2019-04-17 MED ORDER — OZEMPIC (0.25 OR 0.5 MG/DOSE) 2 MG/1.5ML ~~LOC~~ SOPN: PEN_INJECTOR | SUBCUTANEOUS | 3 refills | Status: AC

## 2019-04-17 MED ORDER — GLIMEPIRIDE 4 MG PO TABS: 4.0000 mg | ORAL_TABLET | Freq: Every day | ORAL | 3 refills | Status: DC

## 2019-04-17 MED ORDER — LOVASTATIN 40 MG PO TABS: 40.0000 mg | ORAL_TABLET | Freq: Every day | ORAL | 3 refills | Status: DC

## 2019-04-17 MED ORDER — METFORMIN HCL 1000 MG PO TABS: 1000.0000 mg | ORAL_TABLET | Freq: Two times a day (BID) | ORAL | 3 refills | Status: DC

## 2019-04-17 NOTE — Assessment & Plan Note (Addendum)
Chronic, stable on atenolol 100mg  daily. Continue current regimen.

## 2019-04-17 NOTE — Progress Notes (Signed)
This visit was conducted in person.  BP 120/70 (BP Location: Left Arm, Patient Position: Sitting, Cuff Size: Large)    Pulse 78    Temp 98.2 F (36.8 C) (Temporal)    Ht 6\' 4"  (1.93 m)    Wt 282 lb 2 oz (128 kg)    SpO2 97%    BMI 34.34 kg/m    CC: CPE Subjective:    Patient ID: James [name], male    DOB: Jun 05, 1981, 38 y.o.   MRN: [id number]  HPI: James [name] is a 38 y.o. male presenting on 04/17/2019 for Annual Exam   Doing well on celexa 20mg  daily. Did see online therapist.  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  Brings sugar log - fasting 177 today, 200s in June Fasting for 4 hours.  14 lbs down in last year.   Preventative: Flu shot yearly Tdap 04/2014 Pneumovax 05/2014 Seat belt use discussed Sunscreen use discussed. No changing moles on skin. 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 Diet: good water, fruits/vegetables daily, avoidssoda andfast foods     Relevant past medical, surgical, family and social history reviewed and updated as indicated. Interim medical history since our last visit reviewed. Allergies and medications reviewed and updated. Outpatient Medications Prior to Visit  Medication Sig Dispense Refill   aspirin EC 81 MG tablet Take 81 mg by mouth daily.     vitamin B-12 (V-R VITAMIN B-12) 500 MCG tablet Take 1 tablet (500 mcg total) by mouth daily.     atenolol (TENORMIN) 100 MG tablet Take 1 tablet by mouth once daily 90 tablet 0   citalopram (CELEXA) 20 MG tablet Take 1 tablet by mouth once daily 30 tablet 0   glimepiride (AMARYL) 4 MG tablet Take 1 tablet by mouth once daily with breakfast 90 tablet 0   lovastatin (MEVACOR) 40 MG tablet TAKE 1 TABLET BY MOUTH ONCE DAILY AT BEDTIME 90 tablet 0   metFORMIN (GLUCOPHAGE) 1000 MG tablet TAKE 1 TABLET BY MOUTH TWICE DAILY WITH MEALS  180 tablet 0   No facility-administered medications prior to visit.      Per HPI unless specifically indicated in ROS section below Review of Systems  Constitutional: Negative for activity change, appetite change, chills, fatigue, fever and unexpected weight change.  HENT: Negative for hearing loss.   Eyes: Negative for visual disturbance.  Respiratory: Negative for cough, chest tightness, shortness of breath and wheezing.   Cardiovascular: Negative for chest pain, palpitations and leg swelling.  Gastrointestinal: Negative for abdominal distention, abdominal pain, blood in stool, constipation, diarrhea, nausea and vomiting.  Genitourinary: Negative for difficulty urinating and hematuria.  Musculoskeletal: Negative for arthralgias, myalgias and neck pain.  Skin: Negative for rash.  Neurological: Negative for dizziness, seizures, syncope and headaches.  Hematological: Negative for adenopathy. Does not bruise/bleed easily.  Psychiatric/Behavioral: Negative for dysphoric mood. The patient is not nervous/anxious.    Objective:    BP 120/70 (BP Location: Left Arm, Patient Position: Sitting, Cuff Size: Large)    Pulse 78    Temp 98.2 F (36.8 C) (Temporal)    Ht 6\' 4"  (1.93 m)    Wt 282 lb 2 oz (128 kg)    SpO2 97%    BMI 34.34 kg/m   Wt Readings from Last 3 Encounters:  04/17/19 282 lb 2 oz (128  kg)  06/15/18 296 lb (134.3 kg)  12/22/17 296 lb (134.3 kg)    Physical Exam Vitals signs and nursing note reviewed.  Constitutional:      General: He is not in acute distress.    Appearance: Normal appearance. He is well-developed. He is not ill-appearing.  HENT:     Head: Normocephalic and atraumatic.     Right Ear: Hearing, tympanic membrane, ear canal and external ear normal.     Left Ear: Hearing, tympanic membrane, ear canal and external ear normal.     Nose: Nose normal.     Mouth/Throat:     Mouth: Mucous membranes are moist.     Pharynx: Uvula midline. No oropharyngeal exudate or  posterior oropharyngeal erythema.  Eyes:     General: No scleral icterus.    Extraocular Movements: Extraocular movements intact.     Conjunctiva/sclera: Conjunctivae normal.     Pupils: Pupils are equal, round, and reactive to light.  Neck:     Musculoskeletal: Normal range of motion and neck supple.  Cardiovascular:     Rate and Rhythm: Normal rate and regular rhythm.     Pulses: Normal pulses.          Radial pulses are 2+ on the right side and 2+ on the left side.     Heart sounds: Normal heart sounds. No murmur.  Pulmonary:     Effort: Pulmonary effort is normal. No respiratory distress.     Breath sounds: Normal breath sounds. No wheezing, rhonchi or rales.  Abdominal:     General: Bowel sounds are normal. There is no distension.     Palpations: Abdomen is soft. There is no mass.     Tenderness: There is no abdominal tenderness. There is no guarding or rebound.     Hernia: No hernia is present.  Musculoskeletal: Normal range of motion.     Right lower leg: No edema.     Left lower leg: No edema.     Comments: See HPI for foot exam if done  Lymphadenopathy:     Cervical: No cervical adenopathy.  Skin:    General: Skin is warm and dry.     Findings: No rash.  Neurological:     General: No focal deficit present.     Mental Status: He is alert and oriented to person, place, and time.     Comments: CN grossly intact, station and gait intact  Psychiatric:        Mood and Affect: Mood normal.        Behavior: Behavior normal.        Thought Content: Thought content normal.        Judgment: Judgment normal.     Comments: Bright affect today    Diabetic Foot Exam - Simple   Simple Foot Form Diabetic Foot exam was performed with the following findings: Yes 04/17/2019  3:35 PM  Visual Inspection No deformities, no ulcerations, no other skin breakdown bilaterally: Yes Sensation Testing Intact to touch and monofilament testing bilaterally: Yes Pulse Check Posterior Tibialis  and Dorsalis pulse intact bilaterally: Yes Comments        Results for orders placed or performed in visit on 04/17/19  POCT glycosylated hemoglobin (Hb A1C)  Result Value Ref Range   Hemoglobin A1C 8.6 (A) 4.0 - 5.6 %   HbA1c POC (<> result, manual entry)     HbA1c, POC (prediabetic range)     HbA1c, POC (controlled diabetic range)  Assessment & Plan:   Problem List Items Addressed This Visit    Vitamin B12 deficiency    Back on daily b12 replacement. Update levels.       Relevant Orders   Vitamin B12   Type 2 diabetes, uncontrolled, with neuropathy (HCC)    Deterioration noted despite compliance with current regimen. Discussed GLP1 RA mechanism of action and benefits - will price out ozempic. No known fmhx medullary thyroid cancer.       Relevant Medications   glimepiride (AMARYL) 4 MG tablet   lovastatin (MEVACOR) 40 MG tablet   metFORMIN (GLUCOPHAGE) 1000 MG tablet   Semaglutide,0.25 or 0.5MG /DOS, (OZEMPIC, 0.25 OR 0.5 MG/DOSE,) 2 MG/1.5ML SOPN   Other Relevant Orders   POCT glycosylated hemoglobin (Hb A1C) (Completed)   Hemoglobin A1c   Microalbumin / creatinine urine ratio   Obesity, Class I, BMI 30.0-34.9 (see actual BMI)    Congratulated on weight loss noted to date. Continue healthy changes.       NAFLD (nonalcoholic fatty liver disease)    Update LFTs .      Relevant Orders   Comprehensive metabolic panel   Mixed hyperlipidemia    Chronic, continues lovastatin. Update FLP. The ASCVD Risk score Mikey Bussing DC Jr., et al., 2013) failed to calculate for the following reasons:   The 2013 ASCVD risk score is only valid for ages 69 to 47       Relevant Medications   atenolol (TENORMIN) 100 MG tablet   lovastatin (MEVACOR) 40 MG tablet   Other Relevant Orders   Lipid panel   Comprehensive metabolic panel   Major depressive disorder, recurrent episode, moderate (HCC)    Stable period on celexa 20mg  daily - PHQ9 = 0. Desires to continue current regimen. He did  see online therapist with benefit last few months.      Relevant Medications   citalopram (CELEXA) 20 MG tablet   HTN (hypertension)    Chronic, stable on atenolol 100mg  daily. Continue current regimen.       Relevant Medications   atenolol (TENORMIN) 100 MG tablet   lovastatin (MEVACOR) 40 MG tablet   Other Relevant Orders   Microalbumin / creatinine urine ratio   Health maintenance examination - Primary    Preventative protocols reviewed and updated unless pt declined. Discussed healthy diet and lifestyle.           Meds ordered this encounter  Medications   atenolol (TENORMIN) 100 MG tablet    Sig: Take 1 tablet (100 mg total) by mouth daily.    Dispense:  90 tablet    Refill:  3   citalopram (CELEXA) 20 MG tablet    Sig: Take 1 tablet (20 mg total) by mouth daily.    Dispense:  90 tablet    Refill:  3   glimepiride (AMARYL) 4 MG tablet    Sig: Take 1 tablet (4 mg total) by mouth daily with breakfast.    Dispense:  90 tablet    Refill:  3   lovastatin (MEVACOR) 40 MG tablet    Sig: Take 1 tablet (40 mg total) by mouth at bedtime.    Dispense:  90 tablet    Refill:  3   metFORMIN (GLUCOPHAGE) 1000 MG tablet    Sig: Take 1 tablet (1,000 mg total) by mouth 2 (two) times daily with a meal.    Dispense:  180 tablet    Refill:  3   Semaglutide,0.25 or 0.5MG /DOS, (OZEMPIC, 0.25 OR 0.5 MG/DOSE,)  2 MG/1.5ML SOPN    Sig: Inject 0.25 mg into the skin once a week for 14 days, THEN 0.5 mg once a week.    Dispense:  1 pen    Refill:  3    To price out   Orders Placed This Encounter  Procedures   Lipid panel   Comprehensive metabolic panel   Vitamin B12   Hemoglobin A1c   Microalbumin / creatinine urine ratio   POCT glycosylated hemoglobin (Hb A1C)    Follow up plan: Return in about 3 months (around 07/18/2019) for follow up visit.  Eustaquio BoydenJavier Analya Louissaint, MD

## 2019-04-17 NOTE — Assessment & Plan Note (Signed)
Update LFT's 

## 2019-04-17 NOTE — Assessment & Plan Note (Signed)
Chronic, continues lovastatin. Update FLP. The ASCVD Risk score Mikey Bussing DC Jr., et al., 2013) failed to calculate for the following reasons:   The 2013 ASCVD risk score is only valid for ages 63 to 77

## 2019-04-17 NOTE — Assessment & Plan Note (Signed)
Deterioration noted despite compliance with current regimen. Discussed GLP1 RA mechanism of action and benefits - will price out ozempic. No known fmhx medullary thyroid cancer.

## 2019-04-17 NOTE — Addendum Note (Signed)
Addended by: Ellamae Sia on: 04/17/2019 04:13 PM   Modules accepted: Orders

## 2019-04-17 NOTE — Assessment & Plan Note (Signed)
Preventative protocols reviewed and updated unless pt declined. Discussed healthy diet and lifestyle.  

## 2019-04-17 NOTE — Patient Instructions (Addendum)
Lab work today.  Keep up the good work with healthy diet and lifestyle! Return in 3 months for follow up diabetes.  Sugars are staying too high - price out ozempic 0.25mg  weekly for 2 weeks then increase to 0.5mg  weekly. Look online for ozempic coupon/discount plan (or check with pharmacy). If you'd like have pharmacist review injections or schedule nurse visit here to go over injection teaching.   Health Maintenance, Male Adopting a healthy lifestyle and getting preventive care are important in promoting health and wellness. Ask your health care provider about:  The right schedule for you to have regular tests and exams.  Things you can do on your own to prevent diseases and keep yourself healthy. What should I know about diet, weight, and exercise? Eat a healthy diet   Eat a diet that includes plenty of vegetables, fruits, low-fat dairy products, and lean protein.  Do not eat a lot of foods that are high in solid fats, added sugars, or sodium. Maintain a healthy weight Body mass index (BMI) is a measurement that can be used to identify possible weight problems. It estimates body fat based on height and weight. Your health care provider can help determine your BMI and help you achieve or maintain a healthy weight. Get regular exercise Get regular exercise. This is one of the most important things you can do for your health. Most adults should:  Exercise for at least 150 minutes each week. The exercise should increase your heart rate and make you sweat (moderate-intensity exercise).  Do strengthening exercises at least twice a week. This is in addition to the moderate-intensity exercise.  Spend less time sitting. Even light physical activity can be beneficial. Watch cholesterol and blood lipids Have your blood tested for lipids and cholesterol at 39 years of age, then have this test every 5 years. You may need to have your cholesterol levels checked more often if:  Your lipid or  cholesterol levels are high.  You are older than 39 years of age.  You are at high risk for heart disease. What should I know about cancer screening? Many types of cancers can be detected early and may often be prevented. Depending on your health history and family history, you may need to have cancer screening at various ages. This may include screening for:  Colorectal cancer.  Prostate cancer.  Skin cancer.  Lung cancer. What should I know about heart disease, diabetes, and high blood pressure? Blood pressure and heart disease  High blood pressure causes heart disease and increases the risk of stroke. This is more likely to develop in people who have high blood pressure readings, are of African descent, or are overweight.  Talk with your health care provider about your target blood pressure readings.  Have your blood pressure checked: ? Every 3-5 years if you are 4018-39 years of age. ? Every year if you are 39 years old or older.  If you are between the ages of 2665 and 4575 and are a current or former smoker, ask your health care provider if you should have a one-time screening for abdominal aortic aneurysm (AAA). Diabetes Have regular diabetes screenings. This checks your fasting blood sugar level. Have the screening done:  Once every three years after age 39 if you are at a normal weight and have a low risk for diabetes.  More often and at a younger age if you are overweight or have a high risk for diabetes. What should I know about preventing  infection? Hepatitis B If you have a higher risk for hepatitis B, you should be screened for this virus. Talk with your health care provider to find out if you are at risk for hepatitis B infection. Hepatitis C Blood testing is recommended for:  Everyone born from [birth year] through 1966.  Anyone with known risk factors for hepatitis C. Sexually transmitted infections (STIs)  You should be screened each year for STIs, including gonorrhea  and chlamydia, if: ? You are sexually active and are younger than 39 years of age. ? You are older than 39 years of age and your health care provider tells you that you are at risk for this type of infection. ? Your sexual activity has changed since you were last screened, and you are at increased risk for chlamydia or gonorrhea. Ask your health care provider if you are at risk.  Ask your health care provider about whether you are at high risk for HIV. Your health care provider may recommend a prescription medicine to help prevent HIV infection. If you choose to take medicine to prevent HIV, you should first get tested for HIV. You should then be tested every 3 months for as long as you are taking the medicine. Follow these instructions at home: Lifestyle  Do not use any products that contain nicotine or tobacco, such as cigarettes, e-cigarettes, and chewing tobacco. If you need help quitting, ask your health care provider.  Do not use street drugs.  Do not share needles.  Ask your health care provider for help if you need support or information about quitting drugs. Alcohol use  Do not drink alcohol if your health care provider tells you not to drink.  If you drink alcohol: ? Limit how much you have to 0-2 drinks a day. ? Be aware of how much alcohol is in your drink. In the [place] one drink equals one 12 oz bottle of beer (355 mL), one 5 oz glass of wine (148 mL), or one 1 oz glass of hard liquor (44 mL). General instructions  Schedule regular health, dental, and eye exams.  Stay current with your vaccines.  Tell your health care provider if: ? You often feel depressed. ? You have ever been abused or do not feel safe at home. Summary  Adopting a healthy lifestyle and getting preventive care are important in promoting health and wellness.  Follow your health care provider's instructions about healthy diet, exercising, and getting tested or screened for diseases.  Follow your  health care provider's instructions on monitoring your cholesterol and blood pressure. This information is not intended to replace advice given to you by your health care provider. Make sure you discuss any questions you have with your health care provider. Document Released: 03/03/2008 Document Revised: 08/29/2018 Document Reviewed: 08/29/2018 Elsevier Patient Education  2020 [employer].

## 2019-04-17 NOTE — Assessment & Plan Note (Signed)
Stable period on celexa 20mg  daily - PHQ9 = 0. Desires to continue current regimen. He did see online therapist with benefit last few months.

## 2019-04-17 NOTE — Assessment & Plan Note (Signed)
Back on daily b12 replacement. Update levels.

## 2019-04-17 NOTE — Assessment & Plan Note (Signed)
Congratulated on weight loss noted to date. Continue healthy changes.

## 2019-04-18 LAB — COMPREHENSIVE METABOLIC PANEL
ALT: 46 U/L (ref 0–53)
AST: 29 U/L (ref 0–37)
Albumin: 5.1 g/dL (ref 3.5–5.2)
Alkaline Phosphatase: 58 U/L (ref 39–117)
BUN: 12 mg/dL (ref 6–23)
CO2: 27 mEq/L (ref 19–32)
Calcium: 10.2 mg/dL (ref 8.4–10.5)
Chloride: 101 mEq/L (ref 96–112)
Creatinine, Ser: 0.92 mg/dL (ref 0.40–1.50)
GFR: 91.77 mL/min (ref >60.00)
Glucose, Bld: 89 mg/dL (ref 70–99)
Potassium: 4.1 mEq/L (ref 3.5–5.1)
Sodium: 140 mEq/L (ref 135–145)
Total Bilirubin: 0.7 mg/dL (ref 0.2–1.2)
Total Protein: 7.8 g/dL (ref 6.0–8.3)

## 2019-04-18 LAB — MICROALBUMIN / CREATININE URINE RATIO
Creatinine,U: 152.5 mg/dL
Microalb Creat Ratio: 0.9 mg/g (ref 0.0–30.0)
Microalb, Ur: 1.4 mg/dL (ref 0.0–1.9)

## 2019-04-18 LAB — LIPID PANEL
Cholesterol: 161 mg/dL (ref 0–200)
HDL: 28.6 mg/dL — ABNORMAL LOW (ref >39.00)
NonHDL: 132.01
Total CHOL/HDL Ratio: 6
Triglycerides: 272 mg/dL — ABNORMAL HIGH (ref 0.0–149.0)
VLDL: 54.4 mg/dL — ABNORMAL HIGH (ref 0.0–40.0)

## 2019-04-18 LAB — VITAMIN B12: Vitamin B-12: 821 pg/mL (ref 211–911)

## 2019-04-18 LAB — LDL CHOLESTEROL, DIRECT: Direct LDL: 96 mg/dL

## 2019-07-18 ENCOUNTER — Ambulatory Visit: Payer: BC Managed Care – PPO | Admitting: Family Medicine

## 2019-07-29 ENCOUNTER — Ambulatory Visit: Payer: BC Managed Care – PPO | Admitting: Family Medicine

## 2019-07-29 ENCOUNTER — Encounter: Payer: Self-pay | Admitting: Family Medicine

## 2019-07-29 ENCOUNTER — Other Ambulatory Visit: Payer: Self-pay

## 2019-07-29 VITALS — BP 118/80 | HR 90 | Temp 98.4°F | Ht 76.0 in | Wt 285.6 lb

## 2019-07-29 DIAGNOSIS — E1140 Type 2 diabetes mellitus with diabetic neuropathy, unspecified: Secondary | ICD-10-CM | POA: Diagnosis not present

## 2019-07-29 DIAGNOSIS — E1165 Type 2 diabetes mellitus with hyperglycemia: Secondary | ICD-10-CM

## 2019-07-29 DIAGNOSIS — IMO0002 Reserved for concepts with insufficient information to code with codable children: Secondary | ICD-10-CM

## 2019-07-29 LAB — POCT GLYCOSYLATED HEMOGLOBIN (HGB A1C): Hemoglobin A1C: 7.7 % — AB (ref 4.0–5.6)

## 2019-07-29 MED ORDER — OZEMPIC (1 MG/DOSE) 2 MG/1.5ML ~~LOC~~ SOPN: 1.0000 mg | PEN_INJECTOR | SUBCUTANEOUS | 1 refills | Status: DC

## 2019-07-29 NOTE — Assessment & Plan Note (Signed)
Chronic, improving. Tolerating ozempic well, newly started 03/2019. Increase ozempic to 1mg  weekly, continue metformin and amaryl. RTC 4 mo f/u visit.

## 2019-07-29 NOTE — Patient Instructions (Signed)
Increase ozempic to 1mg  weekly Return in 4 months for follow up visit.

## 2019-07-29 NOTE — Progress Notes (Signed)
This visit was conducted in person.  BP 118/80 (BP Location: Left Arm, Patient Position: Sitting, Cuff Size: Large)   Pulse 90   Temp 98.4 F (36.9 C) (Temporal)   Ht 6\' 4"  (1.93 m)   Wt 285 lb 9 oz (129.5 kg)   SpO2 97%   BMI 34.76 kg/m    CC: 3 mo f/u visit Subjective:    Patient ID: James [name], male    DOB: 1981-04-02, 38 y.o.   MRN: [id number]  HPI: James [name] is a 38 y.o. male presenting on 07/29/2019 for Follow-up (Here for 3 mo f/u.)   DM - does regularly check sugars 150 yesterday morning. Compliant with antihyperglycemic regimen which includes: glimepiride 4mg  daily, metformin 1000mg  bid, ozempic 0.5mg . Denies low sugars or hypoglycemic symptoms. Denies paresthesias. Last diabetic eye exam 02/2019. Pneumovax: 2015. Prevnar: not due. Glucometer brand: bayer contour next. DSME: completed 09/2017.  Lab Results  Component Value Date   HGBA1C 7.7 (A) 07/29/2019   Diabetic Foot Exam - Simple   No data filed     Lab Results  Component Value Date   MICROALBUR 1.4 04/17/2019       Relevant past medical, surgical, family and social history reviewed and updated as indicated. Interim medical history since our last visit reviewed. Allergies and medications reviewed and updated. Outpatient Medications Prior to Visit  Medication Sig Dispense Refill  . aspirin EC 81 MG tablet Take 81 mg by mouth daily.    Marland Kitchen atenolol (TENORMIN) 100 MG tablet Take 1 tablet (100 mg total) by mouth daily. 90 tablet 3  . citalopram (CELEXA) 20 MG tablet Take 1 tablet (20 mg total) by mouth daily. 90 tablet 3  . glimepiride (AMARYL) 4 MG tablet Take 1 tablet (4 mg total) by mouth daily with breakfast. 90 tablet 3  . lovastatin (MEVACOR) 40 MG tablet Take 1 tablet (40 mg total) by mouth at bedtime. 90 tablet 3  . metFORMIN (GLUCOPHAGE) 1000 MG tablet Take 1 tablet (1,000 mg total) by mouth 2 (two) times daily with a meal. 180 tablet 3  . vitamin B-12 (V-R VITAMIN B-12) 500 MCG tablet  Take 1 tablet (500 mcg total) by mouth daily.    Marland Kitchen OZEMPIC, 0.25 OR 0.5 MG/DOSE, 2 MG/1.5ML SOPN Inject 0.5 mg as directed once a week.     No facility-administered medications prior to visit.      Per HPI unless specifically indicated in ROS section below Review of Systems Objective:    BP 118/80 (BP Location: Left Arm, Patient Position: Sitting, Cuff Size: Large)   Pulse 90   Temp 98.4 F (36.9 C) (Temporal)   Ht 6\' 4"  (1.93 m)   Wt 285 lb 9 oz (129.5 kg)   SpO2 97%   BMI 34.76 kg/m   Wt Readings from Last 3 Encounters:  07/29/19 285 lb 9 oz (129.5 kg)  04/17/19 282 lb 2 oz (128 kg)  06/15/18 296 lb (134.3 kg)    Physical Exam Vitals signs and nursing note reviewed.  Constitutional:      General: He is not in acute distress.    Appearance: Normal appearance. He is well-developed. He is obese. He is not ill-appearing.  HENT:     Mouth/Throat:     Mouth: Mucous membranes are moist.     Pharynx: Oropharynx is clear. No posterior oropharyngeal erythema.  Eyes:     General: No scleral icterus.    Extraocular Movements: Extraocular movements intact.     Conjunctiva/sclera: Conjunctivae  normal.     Pupils: Pupils are equal, round, and reactive to light.  Neck:     Musculoskeletal: Normal range of motion and neck supple.  Cardiovascular:     Rate and Rhythm: Normal rate and regular rhythm.     Pulses: Normal pulses.     Heart sounds: Normal heart sounds. No murmur.  Pulmonary:     Effort: Pulmonary effort is normal. No respiratory distress.     Breath sounds: Normal breath sounds. No wheezing, rhonchi or rales.  Musculoskeletal:     Right lower leg: No edema.     Left lower leg: No edema.     Comments: See HPI for foot exam if done  Lymphadenopathy:     Cervical: No cervical adenopathy.  Skin:    General: Skin is warm and dry.     Findings: No rash.  Neurological:     Mental Status: He is alert.  Psychiatric:        Mood and Affect: Mood normal.        Behavior:  Behavior normal.       Results for orders placed or performed in visit on 07/29/19  POCT glycosylated hemoglobin (Hb A1C)  Result Value Ref Range   Hemoglobin A1C 7.7 (A) 4.0 - 5.6 %   HbA1c POC (<> result, manual entry)     HbA1c, POC (prediabetic range)     HbA1c, POC (controlled diabetic range)     Assessment & Plan:   Problem List Items Addressed This Visit    Type 2 diabetes, uncontrolled, with neuropathy (HCC) - Primary    Chronic, improving. Tolerating ozempic well, newly started 03/2019. Increase ozempic to 1mg  weekly, continue metformin and amaryl. RTC 4 mo f/u visit.       Relevant Medications   Semaglutide, 1 MG/DOSE, (OZEMPIC, 1 MG/DOSE,) 2 MG/1.5ML SOPN   Other Relevant Orders   POCT glycosylated hemoglobin (Hb A1C) (Completed)       Meds ordered this encounter  Medications  . Semaglutide, 1 MG/DOSE, (OZEMPIC, 1 MG/DOSE,) 2 MG/1.5ML SOPN    Sig: Inject 1 mg into the skin once a week.    Dispense:  6 pen    Refill:  1   Orders Placed This Encounter  Procedures  . POCT glycosylated hemoglobin (Hb A1C)    Patient Instructions  Increase ozempic to 1mg  weekly Return in 4 months for follow up visit.   Follow up plan: Return in about 4 months (around 11/26/2019) for follow up visit.  , MD

## 2019-08-13 ENCOUNTER — Telehealth: Payer: Self-pay | Admitting: Family Medicine

## 2019-08-13 NOTE — Telephone Encounter (Signed)
Ok to switch. Does she need new Rx or ok with verbal?

## 2019-08-13 NOTE — Telephone Encounter (Signed)
James [name] called stating pt rx for  Atenolol 100- mg is on back on  She wanted to know if she could switch to 50mg  and have him take 2 instead

## 2019-08-14 MED ORDER — ATENOLOL 50 MG PO TABS: 100.0000 mg | ORAL_TABLET | Freq: Every day | ORAL | 0 refills | Status: DC

## 2019-08-14 NOTE — Telephone Encounter (Signed)
Informed pharmacist at [facility] Dr. Darnell Level is ok to switch med.  States will need a new rx sent over.

## 2019-08-14 NOTE — Addendum Note (Signed)
Addended by: Ria Bush on: 08/14/2019 02:23 PM   Modules accepted: Orders

## 2019-08-14 NOTE — Telephone Encounter (Signed)
Sent in

## 2019-08-26 DIAGNOSIS — E119 Type 2 diabetes mellitus without complications: Secondary | ICD-10-CM | POA: Diagnosis not present

## 2019-08-26 DIAGNOSIS — Z7984 Long term (current) use of oral hypoglycemic drugs: Secondary | ICD-10-CM | POA: Diagnosis not present

## 2019-08-26 DIAGNOSIS — H5203 Hypermetropia, bilateral: Secondary | ICD-10-CM | POA: Diagnosis not present

## 2019-08-26 DIAGNOSIS — H524 Presbyopia: Secondary | ICD-10-CM | POA: Diagnosis not present

## 2019-08-26 LAB — HM DIABETES EYE EXAM

## 2019-08-28 ENCOUNTER — Encounter: Payer: Self-pay | Admitting: Family Medicine

## 2019-10-25 DIAGNOSIS — B029 Zoster without complications: Secondary | ICD-10-CM | POA: Diagnosis not present

## 2019-11-13 ENCOUNTER — Other Ambulatory Visit: Payer: Self-pay | Admitting: Family Medicine

## 2019-11-26 ENCOUNTER — Encounter: Payer: Self-pay | Admitting: Family Medicine

## 2019-11-26 ENCOUNTER — Ambulatory Visit: Payer: BC Managed Care – PPO | Admitting: Family Medicine

## 2019-11-26 ENCOUNTER — Other Ambulatory Visit: Payer: Self-pay

## 2019-11-26 VITALS — BP 128/76 | HR 95 | Temp 97.8°F | Ht 76.0 in | Wt 280.1 lb

## 2019-11-26 DIAGNOSIS — IMO0002 Reserved for concepts with insufficient information to code with codable children: Secondary | ICD-10-CM

## 2019-11-26 DIAGNOSIS — E1140 Type 2 diabetes mellitus with diabetic neuropathy, unspecified: Secondary | ICD-10-CM | POA: Diagnosis not present

## 2019-11-26 DIAGNOSIS — E669 Obesity, unspecified: Secondary | ICD-10-CM

## 2019-11-26 DIAGNOSIS — R400 Somnolence: Secondary | ICD-10-CM

## 2019-11-26 DIAGNOSIS — L739 Follicular disorder, unspecified: Secondary | ICD-10-CM | POA: Diagnosis not present

## 2019-11-26 DIAGNOSIS — E1165 Type 2 diabetes mellitus with hyperglycemia: Secondary | ICD-10-CM

## 2019-11-26 DIAGNOSIS — G4733 Obstructive sleep apnea (adult) (pediatric): Secondary | ICD-10-CM | POA: Insufficient documentation

## 2019-11-26 LAB — POCT GLYCOSYLATED HEMOGLOBIN (HGB A1C): Hemoglobin A1C: 9 % — AB (ref 4.0–5.6)

## 2019-11-26 MED ORDER — DOXYCYCLINE HYCLATE 100 MG PO TABS: 100.0000 mg | ORAL_TABLET | Freq: Two times a day (BID) | ORAL | 0 refills | Status: DC

## 2019-11-26 NOTE — Assessment & Plan Note (Signed)
Chronic, deteriorated despite compliance with 3 drug regimen. Encouraged renewed efforts at low sugar low carb diabetic diet, rec start regular exercise routine. Reassess at 3 mo f/u, discussed possibly adding 4th medication (?insulin) if persistently uncontrolled.

## 2019-11-26 NOTE — Assessment & Plan Note (Signed)
Folliculitis bilateral back in h/o shingles seen at [facility] last month s/p valtrex treatment. Will Rx 1 wk doxy course. Photosensitivity precautions reviewed.

## 2019-11-26 NOTE — Patient Instructions (Addendum)
Fill out sleep questionairre today.  For residual rash on back - take doxycycline 1 wk course.  Work on diabetic diet, regular exercise for goal improved sugar control.as A1c was too high today.  Return in 3 months for follow up visit, if persistently high, we may discuss additional medicine.

## 2019-11-26 NOTE — Assessment & Plan Note (Signed)
Screened positive at prior diabetes education (2019). ESS = 4. Endorses daytime sleepiness, snoring with witnessed apnea. Will refer for formal evaluation.

## 2019-11-26 NOTE — Assessment & Plan Note (Signed)
Encouraged ongoing efforts at weight loss.  

## 2019-11-26 NOTE — Progress Notes (Signed)
This visit was conducted in person.  BP 128/76 (BP Location: Left Arm, Patient Position: Sitting, Cuff Size: Large)   Pulse 95   Temp 97.8 F (36.6 C) (Temporal)   Ht 6\' 4"  (1.93 m)   Wt 280 lb 2 oz (127.1 kg)   SpO2 97%   BMI 34.10 kg/m    CC: 4 mo DM f/u visit Subjective:    Patient ID: James [name], male    DOB: 04/05/1981, 39 y.o.   MRN: [id number]  HPI: James [name] is a 39 y.o. male presenting on 11/26/2019 for Diabetes (Here for 4 mo f/u.)   DM - does regularly check fasting sugars 160-230. Compliant with antihyperglycemic regimen which includes: amaryl 4mg  daily, metformin 1000mg  bid, ozempic 1mg  weekly. No regular exercise, . Denies low sugars or hypoglycemic symptoms. Denies paresthesias. Last diabetic eye exam 08/2019. Pneumovax: 2015. Prevnar: not due. Glucometer brand: bayer. DSME: 09/2017.  Lab Results  Component Value Date   HGBA1C 9.0 (A) 11/26/2019   Diabetic Foot Exam - Simple   Simple Foot Form Diabetic Foot exam was performed with the following findings: Yes 11/26/2019  8:42 AM  Visual Inspection No deformities, no ulcerations, no other skin breakdown bilaterally: Yes Sensation Testing Intact to touch and monofilament testing bilaterally: Yes Pulse Check Posterior Tibialis and Dorsalis pulse intact bilaterally: Yes Comments    Lab Results  Component Value Date   MICROALBUR 1.4 04/17/2019    Screened high risk for OSA. Does snore at night. Told he stopped breathing. Some restorative sleep. Some daytime somnolence.   Seen at [facility] last month with dx shingles - treated with antiviral and steroid shot. Notes some residual rash without pain.      Relevant past medical, surgical, family and social history reviewed and updated as indicated. Interim medical history since our last visit reviewed. Allergies and medications reviewed and updated. Outpatient Medications Prior to Visit  Medication Sig Dispense Refill  . aspirin EC 81 MG tablet Take 81  mg by mouth daily.    01/26/2020 atenolol (TENORMIN) 50 MG tablet Take 2 tablets by mouth once daily 180 tablet 0  . citalopram (CELEXA) 20 MG tablet Take 1 tablet (20 mg total) by mouth daily. 90 tablet 3  . glimepiride (AMARYL) 4 MG tablet Take 1 tablet (4 mg total) by mouth daily with breakfast. 90 tablet 3  . lovastatin (MEVACOR) 40 MG tablet Take 1 tablet (40 mg total) by mouth at bedtime. 90 tablet 3  . metFORMIN (GLUCOPHAGE) 1000 MG tablet Take 1 tablet (1,000 mg total) by mouth 2 (two) times daily with a meal. 180 tablet 3  . Semaglutide, 1 MG/DOSE, (OZEMPIC, 1 MG/DOSE,) 2 MG/1.5ML SOPN Inject 1 mg into the skin once a week. 6 pen 1  . vitamin B-12 (V-R VITAMIN B-12) 500 MCG tablet Take 1 tablet (500 mcg total) by mouth daily.    01/26/2020 atenolol (TENORMIN) 100 MG tablet Take 1 tablet (100 mg total) by mouth daily. (Patient not taking: Reported on 11/26/2019) 90 tablet 3   No facility-administered medications prior to visit.     Per HPI unless specifically indicated in ROS section below Review of Systems Objective:    BP 128/76 (BP Location: Left Arm, Patient Position: Sitting, Cuff Size: Large)   Pulse 95   Temp 97.8 F (36.6 C) (Temporal)   Ht 6\' 4"  (1.93 m)   Wt 280 lb 2 oz (127.1 kg)   SpO2 97%   BMI 34.10 kg/m   Wt Readings from  Last 3 Encounters:  11/26/19 280 lb 2 oz (127.1 kg)  07/29/19 285 lb 9 oz (129.5 kg)  04/17/19 282 lb 2 oz (128 kg)    Physical Exam Vitals and nursing note reviewed.  Constitutional:      Appearance: Normal appearance. He is well-developed. He is obese. He is not ill-appearing.  HENT:     Head: Normocephalic and atraumatic.  Eyes:     General: No scleral icterus.    Extraocular Movements: Extraocular movements intact.     Conjunctiva/sclera: Conjunctivae normal.     Pupils: Pupils are equal, round, and reactive to light.  Cardiovascular:     Rate and Rhythm: Normal rate and regular rhythm.     Pulses: Normal pulses.     Heart sounds: Normal heart  sounds. No murmur.  Pulmonary:     Effort: Pulmonary effort is normal. No respiratory distress.     Breath sounds: Normal breath sounds. No wheezing, rhonchi or rales.  Musculoskeletal:     Cervical back: Normal range of motion and neck supple.     Right lower leg: No edema.     Left lower leg: No edema.     Comments: See HPI for foot exam if done  Lymphadenopathy:     Cervical: No cervical adenopathy.  Skin:    General: Skin is warm and dry.     Findings: Rash present.     Comments: Small pustules on bilateral mid back, left side with 2 larger blistering lesions   Neurological:     Mental Status: He is alert.  Psychiatric:        Mood and Affect: Mood normal.        Behavior: Behavior normal.       Results for orders placed or performed in visit on 11/26/19  POCT glycosylated hemoglobin (Hb A1C)  Result Value Ref Range   Hemoglobin A1C 9.0 (A) 4.0 - 5.6 %   HbA1c POC (<> result, manual entry)     HbA1c, POC (prediabetic range)     HbA1c, POC (controlled diabetic range)     Assessment & Plan:  This visit occurred during the SARS-CoV-2 public health emergency.  Safety protocols were in place, including screening questions prior to the visit, additional usage of staff PPE, and extensive cleaning of exam room while observing appropriate contact time as indicated for disinfecting solutions.   Problem List Items Addressed This Visit    Type 2 diabetes, uncontrolled, with neuropathy ([place]) - Primary    Chronic, deteriorated despite compliance with 3 drug regimen. Encouraged renewed efforts at low sugar low carb diabetic diet, rec start regular exercise routine. Reassess at 3 mo f/u, discussed possibly adding 4th medication (?insulin) if persistently uncontrolled.       Relevant Orders   POCT glycosylated hemoglobin (Hb A1C) (Completed)   Obesity, Class I, BMI 30.0-34.9 (see actual BMI)    Encouraged ongoing efforts at weight loss.       Relevant Orders   Ambulatory referral to  Pulmonology   Folliculitis    Folliculitis bilateral back in h/o shingles seen at [facility] last month s/p valtrex treatment. Will Rx 1 wk doxy course. Photosensitivity precautions reviewed.      Daytime sleepiness    Screened positive at prior diabetes education (2019). ESS = 4. Endorses daytime sleepiness, snoring with witnessed apnea. Will refer for formal evaluation.       Relevant Orders   Ambulatory referral to Pulmonology       Meds ordered this  encounter  Medications  . doxycycline (VIBRA-TABS) 100 MG tablet    Sig: Take 1 tablet (100 mg total) by mouth 2 (two) times daily.    Dispense:  14 tablet    Refill:  0   Orders Placed This Encounter  Procedures  . Ambulatory referral to Pulmonology    Referral Priority:   Routine    Referral Type:   Consultation    Referral Reason:   Specialty Services Required    Requested Specialty:   Pulmonary Disease    Number of Visits Requested:   1  . POCT glycosylated hemoglobin (Hb A1C)    Patient Instructions  Fill out sleep questionairre today.  For residual rash on back - take doxycycline 1 wk course.  Work on diabetic diet, regular exercise for goal improved sugar control.as A1c was too high today.  Return in 3 months for follow up visit, if persistently high, we may discuss additional medicine.    Follow up plan: Return in about 3 months (around 02/26/2020) for follow up visit.  [name], MD

## 2019-12-01 ENCOUNTER — Encounter: Payer: Self-pay | Admitting: Family Medicine

## 2019-12-04 MED ORDER — CLINDAMYCIN PHOSPHATE 1 % EX SOLN: Freq: Two times a day (BID) | CUTANEOUS | 0 refills | Status: DC

## 2020-01-01 ENCOUNTER — Ambulatory Visit: Payer: BC Managed Care – PPO | Admitting: Pulmonary Disease

## 2020-01-01 ENCOUNTER — Other Ambulatory Visit: Payer: Self-pay

## 2020-01-01 ENCOUNTER — Encounter: Payer: Self-pay | Admitting: Pulmonary Disease

## 2020-01-01 VITALS — BP 108/78 | HR 95 | Temp 96.7°F | Ht 76.0 in | Wt 275.6 lb

## 2020-01-01 DIAGNOSIS — G4733 Obstructive sleep apnea (adult) (pediatric): Secondary | ICD-10-CM | POA: Diagnosis not present

## 2020-01-01 NOTE — Patient Instructions (Signed)
Moderate probability of significant obstructive sleep apnea  We will schedule a home sleep study  We will follow-up with you in about 3 months  Call with significant concerns Sleep Apnea Sleep apnea is a condition in which breathing pauses or becomes shallow during sleep. Episodes of sleep apnea usually last 10 seconds or longer, and they may occur as many as 20 times an hour. Sleep apnea disrupts your sleep and keeps your body from getting the rest that it needs. This condition can increase your risk of certain health problems, including:  Heart attack.  Stroke.  Obesity.  Diabetes.  Heart failure.  Irregular heartbeat. What are the causes? There are three kinds of sleep apnea:  Obstructive sleep apnea. This kind is caused by a blocked or collapsed airway.  Central sleep apnea. This kind happens when the part of the brain that controls breathing does not send the correct signals to the muscles that control breathing.  Mixed sleep apnea. This is a combination of obstructive and central sleep apnea. The most common cause of this condition is a collapsed or blocked airway. An airway can collapse or become blocked if:  Your throat muscles are abnormally relaxed.  Your tongue and tonsils are larger than normal.  You are overweight.  Your airway is smaller than normal. What increases the risk? You are more likely to develop this condition if you:  Are overweight.  Smoke.  Have a smaller than normal airway.  Are elderly.  Are male.  Drink alcohol.  Take sedatives or tranquilizers.  Have a family history of sleep apnea. What are the signs or symptoms? Symptoms of this condition include:  Trouble staying asleep.  Daytime sleepiness and tiredness.  Irritability.  Loud snoring.  Morning headaches.  Trouble concentrating.  Forgetfulness.  Decreased interest in sex.  Unexplained sleepiness.  Mood swings.  Personality changes.  Feelings of  depression.  Waking up often during the night to urinate.  Dry mouth.  Sore throat. How is this diagnosed? This condition may be diagnosed with:  A medical history.  A physical exam.  A series of tests that are done while you are sleeping (sleep study). These tests are usually done in a sleep lab, but they may also be done at home. How is this treated? Treatment for this condition aims to restore normal breathing and to ease symptoms during sleep. It may involve managing health issues that can affect breathing, such as high blood pressure or obesity. Treatment may include:  Sleeping on your side.  Using a decongestant if you have nasal congestion.  Avoiding the use of depressants, including alcohol, sedatives, and narcotics.  Losing weight if you are overweight.  Making changes to your diet.  Quitting smoking.  Using a device to open your airway while you sleep, such as: ? An oral appliance. This is a custom-made mouthpiece that shifts your lower jaw forward. ? A continuous positive airway pressure (CPAP) device. This device blows air through a mask when you breathe out (exhale). ? A nasal expiratory positive airway pressure (EPAP) device. This device has valves that you put into each nostril. ? A bi-level positive airway pressure (BPAP) device. This device blows air through a mask when you breathe in (inhale) and breathe out (exhale).  Having surgery if other treatments do not work. During surgery, excess tissue is removed to create a wider airway. It is important to get treatment for sleep apnea. Without treatment, this condition can lead to:  High blood pressure.  Coronary artery disease.  In men, an inability to achieve or maintain an erection (impotence).  Reduced thinking abilities. Follow these instructions at home: Lifestyle  Make any lifestyle changes that your health care provider recommends.  Eat a healthy, well-balanced diet.  Take steps to lose weight  if you are overweight.  Avoid using depressants, including alcohol, sedatives, and narcotics.  Do not use any products that contain nicotine or tobacco, such as cigarettes, e-cigarettes, and chewing tobacco. If you need help quitting, ask your health care provider. General instructions  Take over-the-counter and prescription medicines only as told by your health care provider.  If you were given a device to open your airway while you sleep, use it only as told by your health care provider.  If you are having surgery, make sure to tell your health care provider you have sleep apnea. You may need to bring your device with you.  Keep all follow-up visits as told by your health care provider. This is important. Contact a health care provider if:  The device that you received to open your airway during sleep is uncomfortable or does not seem to be working.  Your symptoms do not improve.  Your symptoms get worse. Get help right away if:  You develop: ? Chest pain. ? Shortness of breath. ? Discomfort in your back, arms, or stomach.  You have: ? Trouble speaking. ? Weakness on one side of your body. ? Drooping in your face. These symptoms may represent a serious problem that is an emergency. Do not wait to see if the symptoms will go away. Get medical help right away. Call your local emergency services (911 in the [place] Do not drive yourself to the hospital. Summary  Sleep apnea is a condition in which breathing pauses or becomes shallow during sleep.  The most common cause is a collapsed or blocked airway.  The goal of treatment is to restore normal breathing and to ease symptoms during sleep. This information is not intended to replace advice given to you by your health care provider. Make sure you discuss any questions you have with your health care provider. Document Revised: 02/20/2019 Document Reviewed: 05/01/2018 Elsevier Patient Education  [place].

## 2020-01-01 NOTE — Progress Notes (Signed)
.  ho

## 2020-01-01 NOTE — Progress Notes (Signed)
James [name]    [id number]    06-26-1981  Primary Care Physician:Gutierrez, Wynona Canes, MD  Referring Physician: [name], MD [address] [place],  [place] [address]  Chief complaint:   Patient with witnessed apneas  HPI:  Patient with concern for obstructive sleep apnea Has excessive daytime sleepiness Is following up because partner told him about apneas He has no shortness of breath Denies dryness of his mouth in the mornings Denies headaches He does have occasional sweats He does have occasional palpitations Both parents do snore  Usually goes to bed at 11, wakes up at 8 Takes him about 20 to 30 minutes to fall asleep Final awakening time between 8 and 830 About 20-30 pound weight gain in the last couple years  Memory is fine  Never smoker    Outpatient Encounter Medications as of 01/01/2020  Medication Sig  . aspirin EC 81 MG tablet Take 81 mg by mouth daily.  Marland Kitchen atenolol (TENORMIN) 100 MG tablet Take 1 tablet (100 mg total) by mouth daily.  Marland Kitchen atenolol (TENORMIN) 50 MG tablet Take 2 tablets by mouth once daily  . citalopram (CELEXA) 20 MG tablet Take 1 tablet (20 mg total) by mouth daily.  . clindamycin (CLEOCIN T) 1 % external solution Apply topically 2 (two) times daily.  Marland Kitchen glimepiride (AMARYL) 4 MG tablet Take 1 tablet (4 mg total) by mouth daily with breakfast.  . lovastatin (MEVACOR) 40 MG tablet Take 1 tablet (40 mg total) by mouth at bedtime.  . metFORMIN (GLUCOPHAGE) 1000 MG tablet Take 1 tablet (1,000 mg total) by mouth 2 (two) times daily with a meal.  . Semaglutide, 1 MG/DOSE, (OZEMPIC, 1 MG/DOSE,) 2 MG/1.5ML SOPN Inject 1 mg into the skin once a week.  . vitamin B-12 (V-R VITAMIN B-12) 500 MCG tablet Take 1 tablet (500 mcg total) by mouth daily.  . [DISCONTINUED] metoprolol (TOPROL-XL) 50 MG 24 hr tablet Take 50 mg by mouth daily.     No facility-administered encounter medications on file as of 01/01/2020.    Allergies  as of 01/01/2020  . (No Known Allergies)    Past Medical History:  Diagnosis Date  . Diabetes type 2, controlled (HCC)   . History of asthma childhood   dust mites  . History of pneumonia 09/2014   LLL  . HTN (hypertension)   . Major depressive disorder, recurrent episode, moderate (HCC) 12/26/2015  . Mixed hyperlipidemia   . NAFLD (nonalcoholic fatty liver disease)   . Obesity   . Suicidal thoughts 12/25/2015   s/p ER evaluation    History reviewed. No pertinent surgical history.  Family History  Problem Relation Age of Onset  . Diabetes Father   . Hypertension Father   . CAD Father        29  . Diabetes Mother   . Hypertension Mother   . Diabetes Paternal Grandfather   . Cancer Maternal Grandmother        breast  . Bipolar disorder Maternal Aunt   . Suicidality Maternal Aunt   . Stroke Neg Hx     Social History   Socioeconomic History  . Marital status: Single    Spouse name: Not on file  . Number of children: 0  . Years of education: Not on file  . Highest education level: Not on file  Occupational History  . Not on file  Tobacco Use  . Smoking status: Never Smoker  . Smokeless tobacco:  Never Used  Substance and Sexual Activity  . Alcohol use: No    Alcohol/week: 0.0 standard drinks    Comment: rare - 2 x year  . Drug use: No  . Sexual activity: Yes  Other Topics Concern  . 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 Difficulty of Paying Living Expenses:   Food Insecurity:   . Worried About Programme researcher, broadcasting/film/video in the Last Year:   . Barista in the Last Year:   Transportation Needs:   . Freight forwarder (Medical):   Marland Kitchen Lack of Transportation (Non-Medical):   Physical Activity:   . Days of  Exercise per Week:   . Minutes of Exercise per Session:   Stress:   . Feeling of Stress :   Social Connections:   . Frequency of Communication with Friends and Family:   . Frequency of Social Gatherings with Friends and Family:   . Attends Religious Services:   . Active Member of Clubs or Organizations:   . Attends Banker Meetings:   Marland Kitchen Marital Status:   Intimate Partner Violence:   . Fear of Current or Ex-Partner:   . Emotionally Abused:   Marland Kitchen Physically Abused:   . Sexually Abused:     Review of Systems  Constitutional: Negative.   HENT: Negative.   Eyes: Negative.   Respiratory: Positive for apnea.   Cardiovascular: Negative.   Gastrointestinal: Negative.   Musculoskeletal: Negative.     Vitals:   01/01/20 1356  BP: 108/78  Pulse: 95  Temp: (!) 96.7 F (35.9 C)  SpO2: 98%     Physical Exam  Constitutional: He is oriented to person, place, and time. He appears well-developed and well-nourished.  HENT:  Head: Normocephalic.  Mallampati 4, crowded oropharynx  Eyes: Pupils are equal, round, and reactive to light. Conjunctivae are normal.  Neck: No tracheal deviation present. No thyromegaly present.  Cardiovascular: Normal rate, regular rhythm and normal heart sounds.  Pulmonary/Chest: Effort normal and breath sounds normal. No respiratory distress. He has no wheezes. He has no rales. He exhibits no tenderness.  Musculoskeletal:     Cervical back: Normal range of motion and neck supple.  Neurological: He is alert and oriented to person, place, and time.  Skin: Skin is warm.  Psychiatric: He has a normal mood and affect.   Results of the Epworth flowsheet 01/01/2020  Sitting and reading 1  Watching TV 1  Sitting, inactive in a public place (e.g. a theatre or a meeting) 0  As a passenger in a car for an hour without a break 1  Lying down to rest in the afternoon when circumstances permit 1  Sitting and talking to someone 0  Sitting quietly after a  lunch without alcohol 0  In a car, while stopped for a few minutes in traffic 0  Total score 4    Assessment:  Moderate probability of significant obstructive sleep apnea  Obesity  Pathophysiology of sleep disordered breathing discussed with patient Treatment options for sleep disordered breathing discussed with the patient  Plan/Recommendations: We will schedule patient for home sleep study  Importance of treatment of sleep disordered breathing discussed  Risk of long  term untreated sleep disordered breathing discussed  We will follow-up in 3 months   Sherrilyn Rist MD [facility] 01/01/2020, 2:43 PM  CC: Ria Bush, MD

## 2020-01-09 ENCOUNTER — Ambulatory Visit: Payer: BC Managed Care – PPO

## 2020-01-09 ENCOUNTER — Other Ambulatory Visit: Payer: Self-pay

## 2020-01-09 ENCOUNTER — Encounter: Payer: Self-pay | Admitting: Family Medicine

## 2020-01-09 DIAGNOSIS — G4733 Obstructive sleep apnea (adult) (pediatric): Secondary | ICD-10-CM | POA: Diagnosis not present

## 2020-01-10 NOTE — Telephone Encounter (Signed)
Dr. Wynona Neat and PCC's please advise on patients mychart message:  I fought with that device all night kept blinking the strap light so i dunno if you got your data. Do i have the option of doing the on site study? If so, will my insurance cover the cost of it, dont want a 1000 plus bill sent to me.   Thanks

## 2020-01-10 NOTE — Telephone Encounter (Signed)
I do not believe I have seen the study yet..  If it does not give [place] enough information then an in lab study can be ordered

## 2020-01-18 ENCOUNTER — Other Ambulatory Visit: Payer: Self-pay | Admitting: Family Medicine

## 2020-01-24 ENCOUNTER — Telehealth: Payer: Self-pay | Admitting: Pulmonary Disease

## 2020-01-24 DIAGNOSIS — G4733 Obstructive sleep apnea (adult) (pediatric): Secondary | ICD-10-CM

## 2020-01-24 NOTE — Telephone Encounter (Signed)
Dr. Wynona Neat has reviewed the home sleep test, this showed:  Moderate obstructive sleep apnea  No significant oxygen desaturations  The recording from earlier part of the night did reveal moderate obstructive sleep apnea.  Data acquired is adequate to confirm sleep disordered breathing.  Recommendations   Recommend CPAP therapy for moderate obstructive sleep apnea.  Treatment options are CPAP with the settings auto titrating CPAP with pressure settings of 5-15 cm H2O will be appropriate  Close clinical follow up with compliance monitoring to optimize therapeutic efficiency.    Weight loss measures encouraged.   Advise against driving while sleepy & against medication with sedative side effects.    Make appointment for 3 months for compliance with download with Dr. Wynona Neat.    Patient contacted and identification verified. Results of home sleep study reviewed. Patient is ready to move forward with CPAP therapy, order placed, follow up recall placed. Patient verbalized understanding of results and plan of care.

## 2020-02-05 NOTE — Telephone Encounter (Signed)
Please see pt's mychart message about the phone number that DME needs to call.

## 2020-02-06 NOTE — Telephone Encounter (Signed)
Per message from patient he has spoke to someone and is now scheduled for this cpap set up

## 2020-02-17 ENCOUNTER — Other Ambulatory Visit: Payer: Self-pay | Admitting: Family Medicine

## 2020-02-26 DIAGNOSIS — M79662 Pain in left lower leg: Secondary | ICD-10-CM | POA: Diagnosis not present

## 2020-02-26 DIAGNOSIS — M5416 Radiculopathy, lumbar region: Secondary | ICD-10-CM | POA: Diagnosis not present

## 2020-02-26 DIAGNOSIS — M6281 Muscle weakness (generalized): Secondary | ICD-10-CM | POA: Diagnosis not present

## 2020-02-28 ENCOUNTER — Encounter: Payer: Self-pay | Admitting: Family Medicine

## 2020-02-28 ENCOUNTER — Other Ambulatory Visit: Payer: Self-pay

## 2020-02-28 ENCOUNTER — Ambulatory Visit (INDEPENDENT_AMBULATORY_CARE_PROVIDER_SITE_OTHER): Payer: BC Managed Care – PPO | Admitting: Family Medicine

## 2020-02-28 VITALS — BP 124/78 | HR 89 | Temp 97.9°F | Ht 76.0 in | Wt 273.0 lb

## 2020-02-28 DIAGNOSIS — G4733 Obstructive sleep apnea (adult) (pediatric): Secondary | ICD-10-CM | POA: Diagnosis not present

## 2020-02-28 DIAGNOSIS — M79605 Pain in left leg: Secondary | ICD-10-CM | POA: Insufficient documentation

## 2020-02-28 DIAGNOSIS — I10 Essential (primary) hypertension: Secondary | ICD-10-CM

## 2020-02-28 DIAGNOSIS — E1165 Type 2 diabetes mellitus with hyperglycemia: Secondary | ICD-10-CM | POA: Diagnosis not present

## 2020-02-28 DIAGNOSIS — E1140 Type 2 diabetes mellitus with diabetic neuropathy, unspecified: Secondary | ICD-10-CM | POA: Diagnosis not present

## 2020-02-28 DIAGNOSIS — IMO0002 Reserved for concepts with insufficient information to code with codable children: Secondary | ICD-10-CM

## 2020-02-28 LAB — POCT GLYCOSYLATED HEMOGLOBIN (HGB A1C): Hemoglobin A1C: 8 % — AB (ref 4.0–5.6)

## 2020-02-28 NOTE — Assessment & Plan Note (Signed)
Moderate on recent testing without nocturnal desaturations - has decided against CPAP at this time.

## 2020-02-28 NOTE — Assessment & Plan Note (Signed)
Chronic, stable on current regimen.  

## 2020-02-28 NOTE — Patient Instructions (Addendum)
Return in 3 months for physical.  Sugar control is improved but still too high - continue working on low sugar low carb diet.  Continue knee rehab, for goal getting back to regular walking routine.  Continue working on low sugar low carbs and low sweetened beverages.   Diabetes Mellitus and Nutrition, Adult When you have diabetes (diabetes mellitus), it is very important to have healthy eating habits because your blood sugar (glucose) levels are greatly affected by what you eat and drink. Eating healthy foods in the appropriate amounts, at about the same times every day, can help you:  Control your blood glucose.  Lower your risk of heart disease.  Improve your blood pressure.  Reach or maintain a healthy weight. Every person with diabetes is different, and each person has different needs for a meal plan. Your health care provider may recommend that you work with a diet and nutrition specialist (dietitian) to make a meal plan that is best for you. Your meal plan may vary depending on factors such as:  The calories you need.  The medicines you take.  Your weight.  Your blood glucose, blood pressure, and cholesterol levels.  Your activity level.  Other health conditions you have, such as heart or kidney disease. How do carbohydrates affect me? Carbohydrates, also called carbs, affect your blood glucose level more than any other type of food. Eating carbs naturally raises the amount of glucose in your blood. Carb counting is a method for keeping track of how many carbs you eat. Counting carbs is important to keep your blood glucose at a healthy level, especially if you use insulin or take certain oral diabetes medicines. It is important to know how many carbs you can safely have in each meal. This is different for every person. Your dietitian can help you calculate how many carbs you should have at each meal and for each snack. Foods that contain carbs include:  Bread, cereal, rice,  pasta, and crackers.  Potatoes and corn.  Peas, beans, and lentils.  Milk and yogurt.  Fruit and juice.  Desserts, such as cakes, cookies, ice cream, and candy. How does alcohol affect me? Alcohol can cause a sudden decrease in blood glucose (hypoglycemia), especially if you use insulin or take certain oral diabetes medicines. Hypoglycemia can be a life-threatening condition. Symptoms of hypoglycemia (sleepiness, dizziness, and confusion) are similar to symptoms of having too much alcohol. If your health care provider says that alcohol is safe for you, follow these guidelines:  Limit alcohol intake to no more than 1 drink per day for nonpregnant women and 2 drinks per day for men. One drink equals 12 oz of beer, 5 oz of wine, or 1 oz of hard liquor.  Do not drink on an empty stomach.  Keep yourself hydrated with water, diet soda, or unsweetened iced tea.  Keep in mind that regular soda, juice, and other mixers may contain a lot of sugar and must be counted as carbs. What are tips for following this plan?  Reading food labels  Start by checking the serving size on the "Nutrition Facts" label of packaged foods and drinks. The amount of calories, carbs, fats, and other nutrients listed on the label is based on one serving of the item. Many items contain more than one serving per package.  Check the total grams (g) of carbs in one serving. You can calculate the number of servings of carbs in one serving by dividing the total carbs by 15. For  example, if a food has 30 g of total carbs, it would be equal to 2 servings of carbs.  Check the number of grams (g) of saturated and trans fats in one serving. Choose foods that have low or no amount of these fats.  Check the number of milligrams (mg) of salt (sodium) in one serving. Most people should limit total sodium intake to less than 2,300 mg per day.  Always check the nutrition information of foods labeled as "low-fat" or "nonfat". These  foods may be higher in added sugar or refined carbs and should be avoided.  Talk to your dietitian to identify your daily goals for nutrients listed on the label. Shopping  Avoid buying canned, premade, or processed foods. These foods tend to be high in fat, sodium, and added sugar.  Shop around the outside edge of the grocery store. This includes fresh fruits and vegetables, bulk grains, fresh meats, and fresh dairy. Cooking  Use low-heat cooking methods, such as baking, instead of high-heat cooking methods like deep frying.  Cook using healthy oils, such as olive, canola, or sunflower oil.  Avoid cooking with butter, cream, or high-fat meats. Meal planning  Eat meals and snacks regularly, preferably at the same times every day. Avoid going long periods of time without eating.  Eat foods high in fiber, such as fresh fruits, vegetables, beans, and whole grains. Talk to your dietitian about how many servings of carbs you can eat at each meal.  Eat 4-6 ounces (oz) of lean protein each day, such as lean meat, chicken, fish, eggs, or tofu. One oz of lean protein is equal to: ? 1 oz of meat, chicken, or fish. ? 1 egg. ?  cup of tofu.  Eat some foods each day that contain healthy fats, such as avocado, nuts, seeds, and fish. Lifestyle  Check your blood glucose regularly.  Exercise regularly as told by your health care provider. This may include: ? 150 minutes of moderate-intensity or vigorous-intensity exercise each week. This could be brisk walking, biking, or water aerobics. ? Stretching and doing strength exercises, such as yoga or weightlifting, at least 2 times a week.  Take medicines as told by your health care provider.  Do not use any products that contain nicotine or tobacco, such as cigarettes and e-cigarettes. If you need help quitting, ask your health care provider.  Work with a Social worker or diabetes educator to identify strategies to manage stress and any emotional and  social challenges. Questions to ask a health care provider  Do I need to meet with a diabetes educator?  Do I need to meet with a dietitian?  What number can I call if I have questions?  When are the best times to check my blood glucose? Where to find more information:  American Diabetes Association: diabetes.org  Academy of Nutrition and Dietetics: www.eatright.[employer] of Diabetes and Digestive and Kidney Diseases (NIH): [employer] Summary  A healthy meal plan will help you control your blood glucose and maintain a healthy lifestyle.  Working with a diet and nutrition specialist (dietitian) can help you make a meal plan that is best for you.  Keep in mind that carbohydrates (carbs) and alcohol have immediate effects on your blood glucose levels. It is important to count carbs and to use alcohol carefully. This information is not intended to replace advice given to you by your health care provider. Make sure you discuss any questions you have with your health care provider. Document Revised:  08/18/2017 Document Reviewed: 10/10/2016 Elsevier Patient Education  [place].

## 2020-02-28 NOTE — Assessment & Plan Note (Signed)
Chronic, improved but still above goal. Reviewed renewed efforts at low sugar low carb diabetic diet and regular aerobic exercise routine. No med changes today.

## 2020-02-28 NOTE — Progress Notes (Addendum)
This visit was conducted in person.  BP 124/78   Pulse 89   Temp 97.9 F (36.6 C)   Ht 6\' 4"  (1.93 m)   Wt 273 lb (123.8 kg)   SpO2 97%   BMI 33.23 kg/m    CC: DM f/u visit  Subjective:    Patient ID: James [name], male    DOB: April 24, 1981, 39 y.o.   MRN: [id number]  HPI: James [name] is a 39 y.o. male presenting on 02/28/2020 for Diabetes   Injured leg in April with walking - seeing [facility] with PT (twice weekly). Feels this is starting to improve.   Saw sleep doctor ([place]) - moderate OSA without nocturnal desat. Ongoing daytime sleepiness. Stressful work transition - wants to work in new [place] that will be less stressful.   DM - does not regularly check sugars 200 last week. Compliant with antihyperglycemic regimen which includes: metformin 1000mg  bid, amaryl 4mg  daily, and ozempic 1mg  weekly (Fridays). Working on decreasing daytime eating. Denies low sugars or hypoglycemic symptoms. Denies paresthesias. Last diabetic eye exam 08/2019. Pneumovax: 2015. Prevnar: not due. Glucometer brand: . DSME: 09/2017. Lab Results  Component Value Date   HGBA1C 8.0 (A) 02/28/2020   Diabetic Foot Exam - Simple   No data filed     Lab Results  Component Value Date   MICROALBUR 1.4 04/17/2019       Relevant past medical, surgical, family and social history reviewed and updated as indicated. Interim medical history since our last visit reviewed. Allergies and medications reviewed and updated. Outpatient Medications Prior to Visit  Medication Sig Dispense Refill  . aspirin EC 81 MG tablet Take 81 mg by mouth daily.    Acupuncturist atenolol (TENORMIN) 50 MG tablet Take 2 tablets by mouth once daily 180 tablet 1  . citalopram (CELEXA) 20 MG tablet Take 1 tablet by mouth once daily 90 tablet 1  . glimepiride (AMARYL) 4 MG tablet Take 1 tablet (4 mg total) by mouth daily with breakfast. 90 tablet 3  . lovastatin (MEVACOR) 40 MG tablet Take 1 tablet (40 mg total) by  mouth at bedtime. 90 tablet 3  . metFORMIN (GLUCOPHAGE) 1000 MG tablet Take 1 tablet (1,000 mg total) by mouth 2 (two) times daily with a meal. 180 tablet 3  . OZEMPIC, 1 MG/DOSE, 2 MG/1.5ML SOPN INJECT  1MG  INTO THE SKIN ONCE A WEEK 12 mL 1  . vitamin B-12 (V-R VITAMIN B-12) 500 MCG tablet Take 1 tablet (500 mcg total) by mouth daily.    . metoprolol succinate (TOPROL-XL) 50 MG 24 hr tablet Take 50 mg by mouth daily.    . clindamycin (CLEOCIN T) 1 % external solution Apply topically 2 (two) times daily. 60 mL 0   No facility-administered medications prior to visit.     Per HPI unless specifically indicated in ROS section below Review of Systems Objective:  BP 124/78   Pulse 89   Temp 97.9 F (36.6 C)   Ht 6\' 4"  (1.93 m)   Wt 273 lb (123.8 kg)   SpO2 97%   BMI 33.23 kg/m   Wt Readings from Last 3 Encounters:  02/28/20 273 lb (123.8 kg)  01/01/20 275 lb 9.6 oz (125 kg)  11/26/19 280 lb 2 oz (127.1 kg)      Physical Exam Vitals and nursing note reviewed.  Constitutional:      General: He is not in acute distress.    Appearance: Normal appearance. He is well-developed.  He is not ill-appearing.  HENT:     Head: Normocephalic and atraumatic.  Eyes:     General: No scleral icterus.    Extraocular Movements: Extraocular movements intact.     Conjunctiva/sclera: Conjunctivae normal.     Pupils: Pupils are equal, round, and reactive to light.  Cardiovascular:     Rate and Rhythm: Normal rate and regular rhythm.     Pulses: Normal pulses.     Heart sounds: Normal heart sounds. No murmur heard.   Pulmonary:     Effort: Pulmonary effort is normal. No respiratory distress.     Breath sounds: Normal breath sounds. No wheezing, rhonchi or rales.  Musculoskeletal:     Cervical back: Normal range of motion and neck supple.     Right lower leg: No edema.     Left lower leg: No edema.     Comments: See HPI for foot exam if done  Lymphadenopathy:     Cervical: No cervical  adenopathy.  Skin:    General: Skin is warm and dry.     Findings: No rash.  Neurological:     Mental Status: He is alert.  Psychiatric:        Mood and Affect: Mood normal.        Behavior: Behavior normal.       Results for orders placed or performed in visit on 02/28/20  POCT glycosylated hemoglobin (Hb A1C)  Result Value Ref Range   Hemoglobin A1C 8.0 (A) 4.0 - 5.6 %   HbA1c POC (<> result, manual entry)     HbA1c, POC (prediabetic range)     HbA1c, POC (controlled diabetic range)     Assessment & Plan:  This visit occurred during the SARS-CoV-2 public health emergency.  Safety protocols were in place, including screening questions prior to the visit, additional usage of staff PPE, and extensive cleaning of exam room while observing appropriate contact time as indicated for disinfecting solutions.   Problem List Items Addressed This Visit    Type 2 diabetes, uncontrolled, with neuropathy ([place]) - Primary    Chronic, improved but still above goal. Reviewed renewed efforts at low sugar low carb diabetic diet and regular aerobic exercise routine. No med changes today.       Relevant Orders   POCT glycosylated hemoglobin (Hb A1C) (Completed)   OSA (obstructive sleep apnea)    Moderate on recent testing without nocturnal desaturations - has decided against CPAP at this time.       Left leg pain    Injured L knee seeing ortho and PT with benefit. Walking limited at this time.       HTN (hypertension)    Chronic, stable on current regimen.           No orders of the defined types were placed in this encounter.  Orders Placed This Encounter  Procedures  . POCT glycosylated hemoglobin (Hb A1C)    Patient instructions: Return in 3 months for physical.  Sugar control is improved but still too high - continue working on low sugar low carb diet.  Continue knee rehab, for goal getting back to regular walking routine.  Continue working on low sugar low carbs and low  sweetened beverages.   Follow up plan: Return in about 3 months (around 05/30/2020) for annual exam, prior fasting for blood work.  Ria Bush, MD

## 2020-02-28 NOTE — Assessment & Plan Note (Signed)
Injured L knee seeing ortho and PT with benefit. Walking limited at this time.

## 2020-03-04 DIAGNOSIS — M5416 Radiculopathy, lumbar region: Secondary | ICD-10-CM | POA: Diagnosis not present

## 2020-03-04 DIAGNOSIS — M79662 Pain in left lower leg: Secondary | ICD-10-CM | POA: Diagnosis not present

## 2020-03-04 DIAGNOSIS — M6281 Muscle weakness (generalized): Secondary | ICD-10-CM | POA: Diagnosis not present

## 2020-03-06 DIAGNOSIS — M6281 Muscle weakness (generalized): Secondary | ICD-10-CM | POA: Diagnosis not present

## 2020-03-06 DIAGNOSIS — M5416 Radiculopathy, lumbar region: Secondary | ICD-10-CM | POA: Diagnosis not present

## 2020-03-06 DIAGNOSIS — M79662 Pain in left lower leg: Secondary | ICD-10-CM | POA: Diagnosis not present

## 2020-03-11 DIAGNOSIS — M5416 Radiculopathy, lumbar region: Secondary | ICD-10-CM | POA: Diagnosis not present

## 2020-03-11 DIAGNOSIS — M6281 Muscle weakness (generalized): Secondary | ICD-10-CM | POA: Diagnosis not present

## 2020-03-11 DIAGNOSIS — M79662 Pain in left lower leg: Secondary | ICD-10-CM | POA: Diagnosis not present

## 2020-03-13 DIAGNOSIS — M79662 Pain in left lower leg: Secondary | ICD-10-CM | POA: Diagnosis not present

## 2020-03-13 DIAGNOSIS — M5416 Radiculopathy, lumbar region: Secondary | ICD-10-CM | POA: Diagnosis not present

## 2020-03-13 DIAGNOSIS — M6281 Muscle weakness (generalized): Secondary | ICD-10-CM | POA: Diagnosis not present

## 2020-04-05 ENCOUNTER — Other Ambulatory Visit: Payer: Self-pay | Admitting: Family Medicine

## 2020-05-21 ENCOUNTER — Other Ambulatory Visit: Payer: Self-pay | Admitting: Family Medicine

## 2020-06-01 DIAGNOSIS — H52223 Regular astigmatism, bilateral: Secondary | ICD-10-CM | POA: Diagnosis not present

## 2020-06-01 DIAGNOSIS — E119 Type 2 diabetes mellitus without complications: Secondary | ICD-10-CM | POA: Diagnosis not present

## 2020-06-01 DIAGNOSIS — H5203 Hypermetropia, bilateral: Secondary | ICD-10-CM | POA: Diagnosis not present

## 2020-06-01 DIAGNOSIS — Z7984 Long term (current) use of oral hypoglycemic drugs: Secondary | ICD-10-CM | POA: Diagnosis not present

## 2020-06-01 LAB — HM DIABETES EYE EXAM

## 2020-06-03 ENCOUNTER — Encounter: Payer: Self-pay | Admitting: Family Medicine

## 2020-06-07 ENCOUNTER — Other Ambulatory Visit: Payer: Self-pay | Admitting: Family Medicine

## 2020-06-07 DIAGNOSIS — IMO0002 Reserved for concepts with insufficient information to code with codable children: Secondary | ICD-10-CM

## 2020-06-07 DIAGNOSIS — E1169 Type 2 diabetes mellitus with other specified complication: Secondary | ICD-10-CM

## 2020-06-07 DIAGNOSIS — E1140 Type 2 diabetes mellitus with diabetic neuropathy, unspecified: Secondary | ICD-10-CM

## 2020-06-07 DIAGNOSIS — E538 Deficiency of other specified B group vitamins: Secondary | ICD-10-CM

## 2020-06-09 ENCOUNTER — Other Ambulatory Visit (INDEPENDENT_AMBULATORY_CARE_PROVIDER_SITE_OTHER): Payer: BC Managed Care – PPO

## 2020-06-09 ENCOUNTER — Other Ambulatory Visit: Payer: Self-pay

## 2020-06-09 DIAGNOSIS — IMO0002 Reserved for concepts with insufficient information to code with codable children: Secondary | ICD-10-CM

## 2020-06-09 DIAGNOSIS — E785 Hyperlipidemia, unspecified: Secondary | ICD-10-CM

## 2020-06-09 DIAGNOSIS — E1169 Type 2 diabetes mellitus with other specified complication: Secondary | ICD-10-CM | POA: Diagnosis not present

## 2020-06-09 DIAGNOSIS — E1165 Type 2 diabetes mellitus with hyperglycemia: Secondary | ICD-10-CM | POA: Diagnosis not present

## 2020-06-09 DIAGNOSIS — E538 Deficiency of other specified B group vitamins: Secondary | ICD-10-CM | POA: Diagnosis not present

## 2020-06-09 DIAGNOSIS — E1140 Type 2 diabetes mellitus with diabetic neuropathy, unspecified: Secondary | ICD-10-CM

## 2020-06-09 LAB — LIPID PANEL
Cholesterol: 149 mg/dL (ref 0–200)
HDL: 26.6 mg/dL — ABNORMAL LOW (ref >39.00)
NonHDL: 122
Total CHOL/HDL Ratio: 6
Triglycerides: 351 mg/dL — ABNORMAL HIGH (ref 0.0–149.0)
VLDL: 70.2 mg/dL — ABNORMAL HIGH (ref 0.0–40.0)

## 2020-06-09 LAB — COMPREHENSIVE METABOLIC PANEL
ALT: 37 U/L (ref 0–53)
AST: 22 U/L (ref 0–37)
Albumin: 4.6 g/dL (ref 3.5–5.2)
Alkaline Phosphatase: 63 U/L (ref 39–117)
BUN: 11 mg/dL (ref 6–23)
CO2: 25 mEq/L (ref 19–32)
Calcium: 9.3 mg/dL (ref 8.4–10.5)
Chloride: 99 mEq/L (ref 96–112)
Creatinine, Ser: 1.03 mg/dL (ref 0.40–1.50)
GFR: 80.08 mL/min (ref >60.00)
Glucose, Bld: 207 mg/dL — ABNORMAL HIGH (ref 70–99)
Potassium: 4 mEq/L (ref 3.5–5.1)
Sodium: 136 mEq/L (ref 135–145)
Total Bilirubin: 0.6 mg/dL (ref 0.2–1.2)
Total Protein: 7.7 g/dL (ref 6.0–8.3)

## 2020-06-09 LAB — HEMOGLOBIN A1C: Hgb A1c MFr Bld: 7.2 % — ABNORMAL HIGH (ref 4.6–6.5)

## 2020-06-09 LAB — LDL CHOLESTEROL, DIRECT: Direct LDL: 76 mg/dL

## 2020-06-09 LAB — VITAMIN B12: Vitamin B-12: 743 pg/mL (ref 211–911)

## 2020-06-19 ENCOUNTER — Ambulatory Visit (INDEPENDENT_AMBULATORY_CARE_PROVIDER_SITE_OTHER): Payer: BC Managed Care – PPO | Admitting: Family Medicine

## 2020-06-19 ENCOUNTER — Other Ambulatory Visit: Payer: Self-pay

## 2020-06-19 ENCOUNTER — Encounter: Payer: Self-pay | Admitting: Family Medicine

## 2020-06-19 VITALS — BP 118/68 | HR 100 | Temp 97.7°F | Ht 76.0 in | Wt 262.0 lb

## 2020-06-19 DIAGNOSIS — E669 Obesity, unspecified: Secondary | ICD-10-CM

## 2020-06-19 DIAGNOSIS — I10 Essential (primary) hypertension: Secondary | ICD-10-CM

## 2020-06-19 DIAGNOSIS — Z0000 Encounter for general adult medical examination without abnormal findings: Secondary | ICD-10-CM

## 2020-06-19 DIAGNOSIS — IMO0002 Reserved for concepts with insufficient information to code with codable children: Secondary | ICD-10-CM

## 2020-06-19 DIAGNOSIS — E785 Hyperlipidemia, unspecified: Secondary | ICD-10-CM

## 2020-06-19 DIAGNOSIS — F331 Major depressive disorder, recurrent, moderate: Secondary | ICD-10-CM

## 2020-06-19 DIAGNOSIS — Z23 Encounter for immunization: Secondary | ICD-10-CM | POA: Diagnosis not present

## 2020-06-19 DIAGNOSIS — R000 Tachycardia, unspecified: Secondary | ICD-10-CM

## 2020-06-19 DIAGNOSIS — E1169 Type 2 diabetes mellitus with other specified complication: Secondary | ICD-10-CM | POA: Diagnosis not present

## 2020-06-19 DIAGNOSIS — E1165 Type 2 diabetes mellitus with hyperglycemia: Secondary | ICD-10-CM

## 2020-06-19 DIAGNOSIS — E1140 Type 2 diabetes mellitus with diabetic neuropathy, unspecified: Secondary | ICD-10-CM

## 2020-06-19 NOTE — Progress Notes (Signed)
This visit was conducted in person.  BP 118/68 (BP Location: Left Arm, Patient Position: Sitting, Cuff Size: Large)   Pulse 100   Temp 97.7 F (36.5 C) (Temporal)   Ht 6\' 4"  (1.93 m)   Wt 262 lb (118.8 kg)   SpO2 97%   BMI 31.89 kg/m    Pulse Readings from Last 3 Encounters:  06/19/20 100  02/28/20 89  01/01/20 95   CC: CPE Subjective:    Patient ID: 01/03/20, male    DOB: 01/04/1981, 39 y.o.   MRN: [id number]  HPI: James [name] is a 39 y.o. male presenting on 06/19/2020 for Annual Exam   Doing well on celexa 20mg  daily. Stopped seeing online therapist.  Started new job at 08/19/2020 11/2018, now new job at [employer] - notes increased job stress.   Another 11 lbs down in the past 3 months, 25 lbs total in last year!  Tolerating meds well.   Preventative: Flu shot yearly  Tdap 04/2014 Pneumovax 05/2014 COVID vaccine - Pfizer 11/2019, 12/2019 Seat belt use discussed Sunscreen use discussed. No changing moles on skin. Non smoker Alcohol -none Dentist Q6 mo Eye exam yearly  Lives alone Occupation: works IT at 12/2019  Edu: AA  Activity: 30 min/day 3x/wk on treadmillas well as free weights  Diet: good water, fruits/vegetables daily, avoidssoda andfast foods     Relevant past medical, surgical, family and social history reviewed and updated as indicated. Interim medical history since our last visit reviewed. Allergies and medications reviewed and updated. Outpatient Medications Prior to Visit  Medication Sig Dispense Refill  . aspirin EC 81 MG tablet Take 81 mg by mouth daily.    01/2020 atenolol (TENORMIN) 50 MG tablet Take 2 tablets by mouth once daily 180 tablet 1  . citalopram (CELEXA) 20 MG tablet Take 1 tablet by mouth once daily 90 tablet 1  . glimepiride (AMARYL) 4 MG tablet Take 1 tablet by mouth once daily with breakfast 90 tablet 0  . lovastatin (MEVACOR) 40 MG tablet Take 1 tablet (40 mg total) by mouth at bedtime. 90 tablet 3  .  metFORMIN (GLUCOPHAGE) 1000 MG tablet TAKE 1 TABLET BY MOUTH TWICE DAILY WITH MEALS 180 tablet 0  . OZEMPIC, 1 MG/DOSE, 2 MG/1.5ML SOPN INJECT  1MG  INTO THE SKIN ONCE A WEEK 12 mL 1  . vitamin B-12 (V-R VITAMIN B-12) 500 MCG tablet Take 1 tablet (500 mcg total) by mouth daily.     No facility-administered medications prior to visit.     Per HPI unless specifically indicated in ROS section below Review of Systems  Constitutional: Negative for activity change, appetite change, chills, fatigue, fever and unexpected weight change.  HENT: Negative for hearing loss.   Eyes: Negative for visual disturbance.  Respiratory: Negative for cough, chest tightness, shortness of breath and wheezing.   Cardiovascular: Positive for palpitations. Negative for chest pain and leg swelling.  Gastrointestinal: Negative for abdominal distention, abdominal pain, blood in stool, constipation, diarrhea, nausea and vomiting.  Genitourinary: Negative for difficulty urinating and hematuria.  Musculoskeletal: Negative for arthralgias, myalgias and neck pain.  Skin: Negative for rash.  Neurological: Negative for dizziness, seizures, syncope and headaches.  Hematological: Negative for adenopathy. Does not bruise/bleed easily.  Psychiatric/Behavioral: Negative for dysphoric mood. The patient is nervous/anxious (work related).    Objective:  BP 118/68 (BP Location: Left Arm, Patient Position: Sitting, Cuff Size: Large)   Pulse 100   Temp 97.7 F (36.5 C) (Temporal)  Ht 6\' 4"  (1.93 m)   Wt 262 lb (118.8 kg)   SpO2 97%   BMI 31.89 kg/m   Wt Readings from Last 3 Encounters:  06/19/20 262 lb (118.8 kg)  02/28/20 273 lb (123.8 kg)  01/01/20 275 lb 9.6 oz (125 kg)      Physical Exam Vitals and nursing note reviewed.  Constitutional:      General: He is not in acute distress.    Appearance: Normal appearance. He is well-developed. He is not ill-appearing.  HENT:     Head: Normocephalic and atraumatic.     Right  Ear: Hearing, tympanic membrane, ear canal and external ear normal.     Left Ear: Hearing, tympanic membrane, ear canal and external ear normal.  Eyes:     General: No scleral icterus.    Extraocular Movements: Extraocular movements intact.     Conjunctiva/sclera: Conjunctivae normal.     Pupils: Pupils are equal, round, and reactive to light.  Neck:     Thyroid: No thyroid mass or thyromegaly.  Cardiovascular:     Rate and Rhythm: Regular rhythm. Tachycardia present.     Pulses: Normal pulses.          Radial pulses are 2+ on the right side and 2+ on the left side.     Heart sounds: Normal heart sounds. No murmur heard.   Pulmonary:     Effort: Pulmonary effort is normal. No respiratory distress.     Breath sounds: Normal breath sounds. No wheezing, rhonchi or rales.  Abdominal:     General: Abdomen is flat. Bowel sounds are normal. There is no distension.     Palpations: Abdomen is soft. There is no mass.     Tenderness: There is no abdominal tenderness. There is no guarding or rebound.     Hernia: No hernia is present.  Musculoskeletal:        General: Normal range of motion.     Cervical back: Normal range of motion and neck supple.     Right lower leg: No edema.     Left lower leg: No edema.  Lymphadenopathy:     Cervical: No cervical adenopathy.  Skin:    General: Skin is warm and dry.     Findings: No rash.  Neurological:     General: No focal deficit present.     Mental Status: He is alert and oriented to person, place, and time.     Comments: CN grossly intact, station and gait intact  Psychiatric:        Mood and Affect: Mood normal.        Behavior: Behavior normal.        Thought Content: Thought content normal.        Judgment: Judgment normal.       Results for orders placed or performed in visit on 06/09/20  Vitamin B12  Result Value Ref Range   Vitamin B-12 743 211 - 911 pg/mL  Hemoglobin A1c  Result Value Ref Range   Hgb A1c MFr Bld 7.2 (H) 4.6 -  6.5 %  Comprehensive metabolic panel  Result Value Ref Range   Sodium 136 135 - 145 mEq/L   Potassium 4.0 3.5 - 5.1 mEq/L   Chloride 99 96 - 112 mEq/L   CO2 25 19 - 32 mEq/L   Glucose, Bld 207 (H) 70 - 99 mg/dL   BUN 11 6 - 23 mg/dL   Creatinine, Ser 06/11/20 0.40 - 1.50 mg/dL  Total Bilirubin 0.6 0.2 - 1.2 mg/dL   Alkaline Phosphatase 63 39 - 117 U/L   AST 22 0 - 37 U/L   ALT 37 0 - 53 U/L   Total Protein 7.7 6.0 - 8.3 g/dL   Albumin 4.6 3.5 - 5.2 g/dL   GFR 95.18 >84.16 mL/min   Calcium 9.3 8.4 - 10.5 mg/dL  Lipid panel  Result Value Ref Range   Cholesterol 149 0 - 200 mg/dL   Triglycerides 606.3 (H) 0 - 149 mg/dL   HDL 01.60 (L) >10.93 mg/dL   VLDL 23.5 (H) 0.0 - 57.3 mg/dL   Total CHOL/HDL Ratio 6    NonHDL 122.00   LDL cholesterol, direct  Result Value Ref Range   Direct LDL 76.0 mg/dL   Assessment & Plan:  This visit occurred during the SARS-CoV-2 public health emergency.  Safety protocols were in place, including screening questions prior to the visit, additional usage of staff PPE, and extensive cleaning of exam room while observing appropriate contact time as indicated for disinfecting solutions.   Problem List Items Addressed This Visit    Type 2 diabetes, uncontrolled, with neuropathy (H[facility])    Ongoing improvement with weight loss noted since starting ozempic. Tolerating meds well. High fasting cbg today attributed to late dinner prior to labwork.       Tachycardia    Has been taking atenolol 100mg  once daily - advised to switch to atenolol 50mg  bid. Update with effect.       Obesity, Class I, BMI 30.0-34.9 (see actual BMI)    Congratulated on weight loss to date.       Major depressive disorder, recurrent episode, moderate (HCC)    Work related stress currently driving anxiety. Feels doing overall well on celexa 20mg  daily - continue.       HTN (hypertension)    Chronic, stable. Continue current regimen.       Health maintenance examination - Primary     Preventative protocols reviewed and updated unless pt declined. Discussed healthy diet and lifestyle.       Dyslipidemia associated with type 2 diabetes mellitus (HCC)    LDL stable on lovastatin. Trig high ,HDL low - he attributes high trig to late dinner night prior to labs. The ASCVD Risk score DC Jr., et al., 2013) failed to calculate for the following reasons:   The 2013 ASCVD risk score is only valid for ages 37 to 30        Other Visit Diagnoses    Need for influenza vaccination       Relevant Orders   Flu Vaccine QUAD 36+ mos IM (Completed)       No orders of the defined types were placed in this encounter.  Orders Placed This Encounter  Procedures  . Flu Vaccine QUAD 36+ mos IM    Patient instructions: Flu shot today  Congratulations on ongoing weight loss! Return in 6 months for diabetes follow up with fasting labs prior.  You are doing well   Follow up plan: Return in about 6 months (around 12/18/2020) for follow up visit.  41, MD

## 2020-06-19 NOTE — Assessment & Plan Note (Signed)
Congratulated on weight loss to date. 

## 2020-06-19 NOTE — Assessment & Plan Note (Signed)
Work related stress currently driving anxiety. Feels doing overall well on celexa 20mg  daily - continue.

## 2020-06-19 NOTE — Patient Instructions (Addendum)
Flu shot today  Congratulations on ongoing weight loss! Return in 6 months for diabetes follow up with fasting labs prior.  You are doing well   Health Maintenance, Male Adopting a healthy lifestyle and getting preventive care are important in promoting health and wellness. Ask your health care provider about:  The right schedule for you to have regular tests and exams.  Things you can do on your own to prevent diseases and keep yourself healthy. What should I know about diet, weight, and exercise? Eat a healthy diet   Eat a diet that includes plenty of vegetables, fruits, low-fat dairy products, and lean protein.  Do not eat a lot of foods that are high in solid fats, added sugars, or sodium. Maintain a healthy weight Body mass index (BMI) is a measurement that can be used to identify possible weight problems. It estimates body fat based on height and weight. Your health care provider can help determine your BMI and help you achieve or maintain a healthy weight. Get regular exercise Get regular exercise. This is one of the most important things you can do for your health. Most adults should:  Exercise for at least 150 minutes each week. The exercise should increase your heart rate and make you sweat (moderate-intensity exercise).  Do strengthening exercises at least twice a week. This is in addition to the moderate-intensity exercise.  Spend less time sitting. Even light physical activity can be beneficial. Watch cholesterol and blood lipids Have your blood tested for lipids and cholesterol at 40 years of age, then have this test every 5 years. You may need to have your cholesterol levels checked more often if:  Your lipid or cholesterol levels are high.  You are older than 40 years of age.  You are at high risk for heart disease. What should I know about cancer screening? Many types of cancers can be detected early and may often be prevented. Depending on your health history  and family history, you may need to have cancer screening at various ages. This may include screening for:  Colorectal cancer.  Prostate cancer.  Skin cancer.  Lung cancer. What should I know about heart disease, diabetes, and high blood pressure? Blood pressure and heart disease  High blood pressure causes heart disease and increases the risk of stroke. This is more likely to develop in people who have high blood pressure readings, are of African descent, or are overweight.  Talk with your health care provider about your target blood pressure readings.  Have your blood pressure checked: ? Every 3-5 years if you are 4-68 years of age. ? Every year if you are 84 years old or older.  If you are between the ages of 3 and 80 and are a current or former smoker, ask your health care provider if you should have a one-time screening for abdominal aortic aneurysm (AAA). Diabetes Have regular diabetes screenings. This checks your fasting blood sugar level. Have the screening done:  Once every three years after age 75 if you are at a normal weight and have a low risk for diabetes.  More often and at a younger age if you are overweight or have a high risk for diabetes. What should I know about preventing infection? Hepatitis B If you have a higher risk for hepatitis B, you should be screened for this virus. Talk with your health care provider to find out if you are at risk for hepatitis B infection. Hepatitis C Blood testing is recommended  for:  Everyone born from [birth year] through 1966.  Anyone with known risk factors for hepatitis C. Sexually transmitted infections (STIs)  You should be screened each year for STIs, including gonorrhea and chlamydia, if: ? You are sexually active and are younger than 40 years of age. ? You are older than 40 years of age and your health care provider tells you that you are at risk for this type of infection. ? Your sexual activity has changed since you were  last screened, and you are at increased risk for chlamydia or gonorrhea. Ask your health care provider if you are at risk.  Ask your health care provider about whether you are at high risk for HIV. Your health care provider may recommend a prescription medicine to help prevent HIV infection. If you choose to take medicine to prevent HIV, you should first get tested for HIV. You should then be tested every 3 months for as long as you are taking the medicine. Follow these instructions at home: Lifestyle  Do not use any products that contain nicotine or tobacco, such as cigarettes, e-cigarettes, and chewing tobacco. If you need help quitting, ask your health care provider.  Do not use street drugs.  Do not share needles.  Ask your health care provider for help if you need support or information about quitting drugs. Alcohol use  Do not drink alcohol if your health care provider tells you not to drink.  If you drink alcohol: ? Limit how much you have to 0-2 drinks a day. ? Be aware of how much alcohol is in your drink. In the [place] one drink equals one 12 oz bottle of beer (355 mL), one 5 oz glass of wine (148 mL), or one 1 oz glass of hard liquor (44 mL). General instructions  Schedule regular health, dental, and eye exams.  Stay current with your vaccines.  Tell your health care provider if: ? You often feel depressed. ? You have ever been abused or do not feel safe at home. Summary  Adopting a healthy lifestyle and getting preventive care are important in promoting health and wellness.  Follow your health care provider's instructions about healthy diet, exercising, and getting tested or screened for diseases.  Follow your health care provider's instructions on monitoring your cholesterol and blood pressure. This information is not intended to replace advice given to you by your health care provider. Make sure you discuss any questions you have with your health care  provider. Document Revised: 08/29/2018 Document Reviewed: 08/29/2018 Elsevier Patient Education  2020 [employer].

## 2020-06-19 NOTE — Assessment & Plan Note (Signed)
Preventative protocols reviewed and updated unless pt declined. Discussed healthy diet and lifestyle.  

## 2020-06-19 NOTE — Assessment & Plan Note (Signed)
LDL stable on lovastatin. Trig high ,HDL low - he attributes high trig to late dinner night prior to labs. The ASCVD Risk score James George DC Jr., et al., 2013) failed to calculate for the following reasons:   The 2013 ASCVD risk score is only valid for ages 93 to 48

## 2020-06-19 NOTE — Assessment & Plan Note (Signed)
Has been taking atenolol 100mg  once daily - advised to switch to atenolol 50mg  bid. Update with effect.

## 2020-06-19 NOTE — Assessment & Plan Note (Signed)
Chronic, stable. Continue current regimen. 

## 2020-06-19 NOTE — Assessment & Plan Note (Signed)
Ongoing improvement with weight loss noted since starting ozempic. Tolerating meds well. High fasting cbg today attributed to late dinner prior to labwork.

## 2020-06-20 ENCOUNTER — Other Ambulatory Visit: Payer: Self-pay | Admitting: Family Medicine

## 2020-06-25 ENCOUNTER — Other Ambulatory Visit: Payer: Self-pay

## 2020-06-25 MED ORDER — OZEMPIC (1 MG/DOSE) 2 MG/1.5ML ~~LOC~~ SOPN: PEN_INJECTOR | SUBCUTANEOUS | 1 refills | Status: DC

## 2020-06-25 NOTE — Telephone Encounter (Signed)
E-scribed refill 

## 2020-08-20 ENCOUNTER — Other Ambulatory Visit: Payer: Self-pay | Admitting: Family Medicine

## 2020-09-07 ENCOUNTER — Telehealth: Payer: Self-pay

## 2020-09-07 MED ORDER — GLIMEPIRIDE 4 MG PO TABS: ORAL_TABLET | ORAL | 1 refills | Status: DC

## 2020-09-07 NOTE — Telephone Encounter (Signed)
E-scribed refill 

## 2020-09-07 NOTE — Telephone Encounter (Signed)
Pt left v/m and wanting to get glimepiride 4 mg sent or transferred to [facility] in [place].

## 2020-11-18 ENCOUNTER — Other Ambulatory Visit: Payer: Self-pay

## 2020-11-18 ENCOUNTER — Emergency Department: Payer: PRIVATE HEALTH INSURANCE

## 2020-11-18 ENCOUNTER — Inpatient Hospital Stay
Admission: EM | Admit: 2020-11-18 | Discharge: 2020-11-20 | DRG: 247 | Disposition: A | Discharge status: Home / Self Care | Payer: PRIVATE HEALTH INSURANCE | Attending: Obstetrics and Gynecology | Admitting: Obstetrics and Gynecology

## 2020-11-18 ENCOUNTER — Ambulatory Visit (INDEPENDENT_AMBULATORY_CARE_PROVIDER_SITE_OTHER)
Admission: EM | Admit: 2020-11-18 | Discharge: 2020-11-18 | Disposition: A | Discharge status: Home / Self Care | Payer: PRIVATE HEALTH INSURANCE | Source: Home / Self Care

## 2020-11-18 DIAGNOSIS — K760 Fatty (change of) liver, not elsewhere classified: Secondary | ICD-10-CM

## 2020-11-18 DIAGNOSIS — I214 Non-ST elevation (NSTEMI) myocardial infarction: Principal | ICD-10-CM | POA: Diagnosis present

## 2020-11-18 DIAGNOSIS — Z20822 Contact with and (suspected) exposure to covid-19: Secondary | ICD-10-CM | POA: Diagnosis present

## 2020-11-18 DIAGNOSIS — Z7984 Long term (current) use of oral hypoglycemic drugs: Secondary | ICD-10-CM | POA: Diagnosis not present

## 2020-11-18 DIAGNOSIS — F329 Major depressive disorder, single episode, unspecified: Secondary | ICD-10-CM | POA: Diagnosis present

## 2020-11-18 DIAGNOSIS — G4733 Obstructive sleep apnea (adult) (pediatric): Secondary | ICD-10-CM | POA: Diagnosis present

## 2020-11-18 DIAGNOSIS — Z833 Family history of diabetes mellitus: Secondary | ICD-10-CM | POA: Diagnosis not present

## 2020-11-18 DIAGNOSIS — Z8701 Personal history of pneumonia (recurrent): Secondary | ICD-10-CM | POA: Diagnosis not present

## 2020-11-18 DIAGNOSIS — Z8709 Personal history of other diseases of the respiratory system: Secondary | ICD-10-CM

## 2020-11-18 DIAGNOSIS — K7460 Unspecified cirrhosis of liver: Secondary | ICD-10-CM | POA: Diagnosis present

## 2020-11-18 DIAGNOSIS — R7989 Other specified abnormal findings of blood chemistry: Secondary | ICD-10-CM | POA: Diagnosis present

## 2020-11-18 DIAGNOSIS — Z79899 Other long term (current) drug therapy: Secondary | ICD-10-CM

## 2020-11-18 DIAGNOSIS — E1140 Type 2 diabetes mellitus with diabetic neuropathy, unspecified: Secondary | ICD-10-CM | POA: Diagnosis present

## 2020-11-18 DIAGNOSIS — I10 Essential (primary) hypertension: Secondary | ICD-10-CM | POA: Diagnosis present

## 2020-11-18 DIAGNOSIS — E785 Hyperlipidemia, unspecified: Secondary | ICD-10-CM | POA: Diagnosis not present

## 2020-11-18 DIAGNOSIS — R079 Chest pain, unspecified: Secondary | ICD-10-CM | POA: Diagnosis present

## 2020-11-18 DIAGNOSIS — Z6834 Body mass index (BMI) 34.0-34.9, adult: Secondary | ICD-10-CM

## 2020-11-18 DIAGNOSIS — E1169 Type 2 diabetes mellitus with other specified complication: Secondary | ICD-10-CM | POA: Diagnosis present

## 2020-11-18 DIAGNOSIS — E1165 Type 2 diabetes mellitus with hyperglycemia: Secondary | ICD-10-CM | POA: Diagnosis present

## 2020-11-18 DIAGNOSIS — Z8249 Family history of ischemic heart disease and other diseases of the circulatory system: Secondary | ICD-10-CM

## 2020-11-18 DIAGNOSIS — E782 Mixed hyperlipidemia: Secondary | ICD-10-CM | POA: Diagnosis present

## 2020-11-18 DIAGNOSIS — E669 Obesity, unspecified: Secondary | ICD-10-CM | POA: Diagnosis present

## 2020-11-18 DIAGNOSIS — I2510 Atherosclerotic heart disease of native coronary artery without angina pectoris: Secondary | ICD-10-CM | POA: Diagnosis present

## 2020-11-18 DIAGNOSIS — R000 Tachycardia, unspecified: Secondary | ICD-10-CM | POA: Diagnosis present

## 2020-11-18 DIAGNOSIS — R0789 Other chest pain: Secondary | ICD-10-CM | POA: Diagnosis not present

## 2020-11-18 LAB — CBC
HCT: 44.7 % (ref 39.0–52.0)
Hemoglobin: 15.5 g/dL (ref 13.0–17.0)
MCH: 29.1 pg (ref 26.0–34.0)
MCHC: 34.7 g/dL (ref 30.0–36.0)
MCV: 84 fL (ref 80.0–100.0)
Platelets: 188 10*3/uL (ref 150–400)
RBC: 5.32 MIL/uL (ref 4.22–5.81)
RDW: 12.5 % (ref 11.5–15.5)
WBC: 8 10*3/uL (ref 4.0–10.5)
nRBC: 0 % (ref 0.0–0.2)

## 2020-11-18 LAB — PROTIME-INR
INR: 1 (ref 0.8–1.2)
Prothrombin Time: 13.1 seconds (ref 11.4–15.2)

## 2020-11-18 LAB — TROPONIN I (HIGH SENSITIVITY)
Troponin I (High Sensitivity): 86 ng/L — ABNORMAL HIGH (ref <18)
Troponin I (High Sensitivity): 161 ng/L (ref <18)
Troponin I (High Sensitivity): 410 ng/L (ref <18)

## 2020-11-18 LAB — RESP PANEL BY RT-PCR (FLU A&B, COVID) ARPGX2
Influenza A by PCR: NEGATIVE
Influenza B by PCR: NEGATIVE
SARS Coronavirus 2 by RT PCR: NEGATIVE

## 2020-11-18 LAB — BASIC METABOLIC PANEL
Anion gap: 10 (ref 5–15)
BUN: 9 mg/dL (ref 6–20)
CO2: 23 mmol/L (ref 22–32)
Calcium: 9.7 mg/dL (ref 8.9–10.3)
Chloride: 100 mmol/L (ref 98–111)
Creatinine, Ser: 0.8 mg/dL (ref 0.61–1.24)
GFR, Estimated: >60 mL/min (ref >60)
Glucose, Bld: 180 mg/dL — ABNORMAL HIGH (ref 70–99)
Potassium: 4.2 mmol/L (ref 3.5–5.1)
Sodium: 133 mmol/L — ABNORMAL LOW (ref 135–145)

## 2020-11-18 LAB — HEPARIN LEVEL (UNFRACTIONATED): Heparin Unfractionated: 0.51 IU/mL (ref 0.30–0.70)

## 2020-11-18 LAB — D-DIMER, QUANTITATIVE: D-Dimer, Quant: 0.32 ug/mL-FEU (ref 0.00–0.50)

## 2020-11-18 LAB — CBG MONITORING, ED
Glucose-Capillary: 117 mg/dL — ABNORMAL HIGH (ref 70–99)
Glucose-Capillary: 144 mg/dL — ABNORMAL HIGH (ref 70–99)

## 2020-11-18 LAB — APTT: aPTT: 27 seconds (ref 24–36)

## 2020-11-18 LAB — LIPASE, BLOOD: Lipase: 36 U/L (ref 11–51)

## 2020-11-18 LAB — MAGNESIUM: Magnesium: 2.2 mg/dL (ref 1.7–2.4)

## 2020-11-18 MED ORDER — SODIUM CHLORIDE 0.9% FLUSH
3.0000 mL | Freq: Two times a day (BID) | INTRAVENOUS | Status: DC
  Administered 2020-11-18 – 2020-11-19 (×2): 3 mL via INTRAVENOUS

## 2020-11-18 MED ORDER — METOPROLOL TARTRATE 25 MG PO TABS
12.5000 mg | ORAL_TABLET | Freq: Two times a day (BID) | ORAL | Status: DC
  Administered 2020-11-18 – 2020-11-20 (×3): 12.5 mg via ORAL
  Filled 2020-11-18 (×3): qty 1

## 2020-11-18 MED ORDER — ATORVASTATIN CALCIUM 20 MG PO TABS
40.0000 mg | ORAL_TABLET | Freq: Every day | ORAL | Status: DC
  Administered 2020-11-18: 40 mg via ORAL
  Filled 2020-11-18: qty 2

## 2020-11-18 MED ORDER — INSULIN ASPART 100 UNIT/ML ~~LOC~~ SOLN
0.0000 [IU] | Freq: Three times a day (TID) | SUBCUTANEOUS | Status: DC
  Administered 2020-11-19 (×2): 3 [IU] via SUBCUTANEOUS
  Administered 2020-11-20: 7 [IU] via SUBCUTANEOUS
  Filled 2020-11-18 (×2): qty 1

## 2020-11-18 MED ORDER — HEPARIN BOLUS VIA INFUSION
4000.0000 [IU] | Freq: Once | INTRAVENOUS | Status: AC
  Administered 2020-11-18: 4000 [IU] via INTRAVENOUS
  Filled 2020-11-18: qty 4000

## 2020-11-18 MED ORDER — ASPIRIN 81 MG PO CHEW
162.0000 mg | CHEWABLE_TABLET | Freq: Once | ORAL | Status: AC
  Administered 2020-11-18: 162 mg via ORAL

## 2020-11-18 MED ORDER — NITROGLYCERIN 0.4 MG SL SUBL: 0.4000 mg | SUBLINGUAL_TABLET | SUBLINGUAL | Status: DC | PRN

## 2020-11-18 MED ORDER — CITALOPRAM HYDROBROMIDE 20 MG PO TABS
20.0000 mg | ORAL_TABLET | Freq: Every day | ORAL | Status: DC
  Administered 2020-11-20: 20 mg via ORAL
  Filled 2020-11-18: qty 1

## 2020-11-18 MED ORDER — HEPARIN (PORCINE) 25000 UT/250ML-% IV SOLN
2100.0000 [IU]/h | INTRAVENOUS | Status: DC
  Administered 2020-11-18: 1600 [IU]/h via INTRAVENOUS
  Filled 2020-11-18 (×2): qty 250

## 2020-11-18 NOTE — ED Notes (Signed)
Called lab to send more covid swab tubes at this time. Per lab, will tube some down.

## 2020-11-18 NOTE — Progress Notes (Signed)
Update: I have consulted the on-call cardiologist, Dr.Kowalski, for NSTEMI and chest pain.    Newton Pigg, DO Hospitalist

## 2020-11-18 NOTE — ED Notes (Signed)
Patient is being discharged from the Urgent Care and sent to the Emergency Department via EMS. Per Becky Augusta, NP, patient is in need of higher level of care due to chest pain and elevated troponin. Patient is aware and verbalizes understanding of plan of care.  Vitals:   11/18/20 1206  BP: (!) 129/92  Pulse: 70  Resp: 18  Temp: 98.3 F (36.8 C)  SpO2: 99%

## 2020-11-18 NOTE — Consult Note (Signed)
[facility] Cardiology Consultation Note  Patient ID: James [name], MRN: [id number], DOB/AGE: May 21, 1981 40 y.o. Admit date: 11/18/2020   Date of Consult: 11/18/2020 Primary Physician: [name], MD Primary Cardiologist: None  Chief Complaint:  Chief Complaint  Patient presents with  . Chest Pain   Reason for Consult: Non-ST elevation myocardial infarction  HPI: 40 y.o. male with a strong family history of cardiovascular disease hypertension hyperlipidemia and diabetes has been on appropriate medication management and doing quite well with these medications with his risk factors.  The patient has had a significant episode of substernal chest discomfort radiating into his back with shortness of breath burning in nature lasting for many minutes and then returned after while for further chest discomfort that finally subsided after being in the emergency room.  The patient has an EKG with normal sinus rhythm nonspecific ST changes.  Additionally a troponin has now peaked at 410.  With his symptoms this mainly suggest the possibility of non-ST elevation myocardial infarction and acute coronary syndrome.  He now is completely pain-free and hemodynamically stable with heparin and other medication management  Past Medical History:  Diagnosis Date  . Diabetes type 2, controlled (HCC)   . History of asthma childhood   dust mites  . History of pneumonia 09/2014   LLL  . HTN (hypertension)   . Major depressive disorder, recurrent episode, moderate (HCC) 12/26/2015  . Mixed hyperlipidemia   . NAFLD (nonalcoholic fatty liver disease)   . Obesity   . Suicidal thoughts 12/25/2015   s/p ER evaluation      Surgical History: History reviewed. No pertinent surgical history.   Home Meds: Prior to Admission medications   Medication Sig Start Date End Date Taking? Authorizing Provider  atenolol (TENORMIN) 50 MG tablet Take 2 tablets by mouth once daily Patient taking differently: Take 100 mg  by mouth daily. 08/21/20  Yes [name], MD  citalopram (CELEXA) 20 MG tablet Take 1 tablet by mouth once daily Patient taking differently: Take 20 mg by mouth daily. 02/18/20  Yes [name], MD  glimepiride (AMARYL) 4 MG tablet Take 1 tablet by mouth once daily with breakfast Patient taking differently: Take 4 mg by mouth daily with breakfast. Take 1 tablet by mouth once daily with breakfast 09/07/20  Yes [name], MD  lovastatin (MEVACOR) 40 MG tablet TAKE 1 TABLET BY MOUTH AT BEDTIME Patient taking differently: Take 40 mg by mouth at bedtime. 06/22/20  Yes [name], MD  metFORMIN (GLUCOPHAGE) 1000 MG tablet TAKE 1 TABLET BY MOUTH TWICE DAILY WITH MEALS Patient taking differently: Take 1,000 mg by mouth 2 (two) times daily with a meal. 08/21/20  Yes [name], MD  vitamin B-12 (V-R VITAMIN B-12) 500 MCG tablet Take 1 tablet (500 mcg total) by mouth daily. 06/15/18  Yes [name], MD    Inpatient Medications:  . atorvastatin  40 mg Oral QHS  . [START ON 11/19/2020] citalopram  20 mg Oral Daily  . insulin aspart  0-9 Units Subcutaneous TID WC  . metoprolol tartrate  12.5 mg Oral BID   . heparin 1,600 Units/hr (11/18/20 1704)    Allergies: No Known Allergies  Social History   Socioeconomic History  . Marital status: Single    Spouse name: Not on file  . Number of children: 0  . Years of education: Not on file  . Highest education level: Not on file  Occupational History  . Not on file  Tobacco Use  . Smoking  status: Never Smoker  . Smokeless tobacco: Never Used  Vaping Use  . Vaping Use: Never used  Substance and Sexual Activity  . Alcohol use: No    Alcohol/week: 0.0 standard drinks    Comment: rare - 2 x year  . Drug use: No  . Sexual activity: Yes  Other Topics Concern  . 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 Diabetes Father   . Hypertension Father   . CAD Father        85  . Diabetes Mother   . Hypertension Mother   . Diabetes Paternal Grandfather   . Cancer Maternal Grandmother        breast  . Bipolar disorder Maternal Aunt   . Suicidality Maternal Aunt   . Stroke Neg Hx      Review of Systems Positive for chest pain Negative for: General:  chills, fever, night sweats or weight changes.  Cardiovascular: PND orthopnea syncope dizziness  Dermatological skin lesions rashes Respiratory: Cough congestion Urologic: Frequent urination urination at night and hematuria Abdominal: negative for nausea, vomiting, diarrhea, bright red blood per rectum, melena, or hematemesis Neurologic: negative for visual changes, and/or hearing changes  All other systems reviewed and are otherwise negative except as noted above.  Labs: No results for input(s): CKTOTAL, CKMB, TROPONINI in the last 72 hours. Lab Results  Component Value Date   WBC 8.0 11/18/2020   HGB 15.5 11/18/2020   HCT 44.7 11/18/2020   MCV 84.0 11/18/2020   PLT 188 11/18/2020    Recent Labs  Lab 11/18/20 1411  NA 133*  K 4.2  CL 100  CO2 23  BUN 9  CREATININE 0.80  CALCIUM 9.7  GLUCOSE 180*   Lab Results  Component Value Date   CHOL 149 06/09/2020   HDL 26.60 (L) 06/09/2020   LDLCALC 88 06/09/2014   TRIG 351.0 (H) 06/09/2020   Lab Results  Component Value Date   DDIMER 0.32 11/18/2020    Radiology/Studies:  DG Chest 2 View  Result Date: 11/18/2020 CLINICAL DATA:  Chest pain EXAM: CHEST - 2 VIEW COMPARISON:   10/08/2014 FINDINGS: Heart and mediastinal contours are within normal limits. No focal opacities or effusions. No acute bony abnormality. IMPRESSION: No active cardiopulmonary disease. Electronically Signed   By: Charlett Nose M.D.   On: 11/18/2020 15:03    EKG: Normal sinus rhythm with nonspecific ST and T wave changes  Weights: Filed Weights   11/18/20 1416  Weight: 129.3 kg     Physical Exam: Blood pressure (!) 140/101, pulse 73, temperature 98.6 F (37 C), temperature source Oral, resp. rate 18, height 6\' 4"  (1.93 m), weight 129.3 kg, SpO2 97 %. Body mass index is 34.69 kg/m. General: Well developed, well nourished, in no acute distress. Head eyes ears nose throat: Normocephalic, atraumatic, sclera non-icteric, no xanthomas, nares are without discharge. No apparent thyromegaly and/or mass  Lungs: Normal respiratory  effort.  no wheezes, no rales, no rhonchi.  Heart: RRR with normal S1 S2. no murmur gallop, no rub, PMI is normal size and placement, carotid upstroke normal without bruit, jugular venous pressure is normal Abdomen: Soft, non-tender, non-distended with normoactive bowel sounds. No hepatomegaly. No rebound/guarding. No obvious abdominal masses. Abdominal aorta is normal size without bruit Extremities: No edema. no cyanosis, no clubbing, no ulcers  Peripheral : 2+ bilateral upper extremity pulses, 2+ bilateral femoral pulses, 2+ bilateral dorsal pedal pulse Neuro: Alert and oriented. No facial asymmetry. No focal deficit. Moves all extremities spontaneously. Musculoskeletal: Normal muscle tone without kyphosis Psych:  Responds to questions appropriately with a normal affect.    Assessment: 40 year old male with hypertension hyperlipidemia diabetes with acute non-ST elevation myocardial infarction and no current evidence of congestive heart failure  Plan: 1.  Continue heparin for further risk reduction of myocardial infarction and add aspirin as well 2.  Serial ECG and  enzymes to assess extent of myocardial infarction 3.  Echocardiogram for LV systolic dysfunction valvular heart disease contributing to above 4.  Continue risk factor modification of other concerns including high intensity cholesterol therapy, hypertension control with beta-blocker and ACE inhibitor 5.  Proceed to cardiac catheterization to assess coronary anatomy and further syndrome as necessary.  Patient understands risk and benefits of cardiac catheterization.  This includes the possibility of death stroke heart attack infection bleeding or blood clot.  He is at low risk for conscious sedation  Signed, Lamar Blinks M.D. [facility] Cardiology 11/18/2020, 6:29 PM

## 2020-11-18 NOTE — Discharge Instructions (Signed)
Patient transferred to [facility] via [facility].

## 2020-11-18 NOTE — ED Triage Notes (Signed)
Pt c/o chest pain that started this morning. He took some Tums and did improve. He states feeling returned and he took 2 low dose aspirin and he also improved after these. Pt describes centralized chest pain, denies radiating pain, denies shob. Pt does have hx of htn and is on meds.

## 2020-11-18 NOTE — ED Provider Notes (Addendum)
[facility]    CSN: [id number] Arrival date & time: 11/18/20  1152      History   Chief Complaint Chief Complaint  Patient presents with  . Chest Pain    HPI James [name] is a 40 y.o. male.   HPI   40 year old male here for evaluation of chest pain.  Patient reports that he woke up somewhere between 3 and 4 AM with a burning sensation in the middle of his chest.  He reports he got up took some Tums and the pain abated and he went back to sleep.  A few hours later he was at work and the pain returned he took some aspirin and is not sure if that helped or not.  Patient denies any associated nausea, shortness of breath, sweating, or radiation of the pain.  Patient does not have any personal cardiac history.  Patient does not smoke or drink.  Patient does not currently exercise and has a sedentary job.  Patient's father recently had a triple-vessel CABG and he had an angioplasty 10 years ago.  Patient states that he thinks it might be GI related because he ate pizza last night.  Patient is not currently having any pain.  Past Medical History:  Diagnosis Date  . Diabetes type 2, controlled (HCC)   . History of asthma childhood   dust mites  . History of pneumonia 09/2014   LLL  . HTN (hypertension)   . Major depressive disorder, recurrent episode, moderate (HCC) 12/26/2015  . Mixed hyperlipidemia   . NAFLD (nonalcoholic fatty liver disease)   . Obesity   . Suicidal thoughts 12/25/2015   s/p ER evaluation    Patient Active Problem List   Diagnosis Date Noted  . Tachycardia 06/19/2020  . Left leg pain 02/28/2020  . OSA (obstructive sleep apnea) 11/26/2019  . Folliculitis 11/26/2019  . Grade 3 hypertensive retinopathy, left 03/01/2019  . Left shoulder pain 05/29/2017  . Vitamin B12 deficiency 03/18/2016  . Major depressive disorder, recurrent episode, moderate (HCC) 12/26/2015  . Foot pain, bilateral 02/03/2015  . Health maintenance examination 12/09/2014  .  HTN (hypertension)   . Type 2 diabetes, uncontrolled, with neuropathy (HCC)   . Obesity, Class I, BMI 30.0-34.9 (see actual BMI)   . NAFLD (nonalcoholic fatty liver disease) 69/62/9528  . Dyslipidemia associated with type 2 diabetes mellitus (HCC) 11/20/2010    History reviewed. No pertinent surgical history.     Home Medications    Prior to Admission medications   Medication Sig Start Date End Date Taking? Authorizing Provider  atenolol (TENORMIN) 50 MG tablet Take 2 tablets by mouth once daily 08/21/20  Yes [name], MD  citalopram (CELEXA) 20 MG tablet Take 1 tablet by mouth once daily 02/18/20  Yes [name], MD  glimepiride (AMARYL) 4 MG tablet Take 1 tablet by mouth once daily with breakfast 09/07/20  Yes [name], MD  lovastatin (MEVACOR) 40 MG tablet TAKE 1 TABLET BY MOUTH AT BEDTIME 06/22/20  Yes [name], MD  metFORMIN (GLUCOPHAGE) 1000 MG tablet TAKE 1 TABLET BY MOUTH TWICE DAILY WITH MEALS 08/21/20  Yes [name], MD  vitamin B-12 (V-R VITAMIN B-12) 500 MCG tablet Take 1 tablet (500 mcg total) by mouth daily. 06/15/18  Yes [name], MD    Family History Family History  Problem Relation Age of Onset  . Diabetes Father   . Hypertension Father   . CAD Father        74  .  Diabetes Mother   . Hypertension Mother   . Diabetes Paternal Grandfather   . Cancer Maternal Grandmother        breast  . Bipolar disorder Maternal Aunt   . Suicidality Maternal Aunt   . Stroke Neg Hx     Social History Social History   Tobacco Use  . Smoking status: Never Smoker  . Smokeless tobacco: Never Used  Vaping Use  . Vaping Use: Never used  Substance Use Topics  . Alcohol use: No    Alcohol/week: 0.0 standard drinks    Comment: rare - 2 x year  . Drug use: No     Allergies   Patient has no known allergies.   Review of Systems Review of Systems  Constitutional: Negative for diaphoresis.  Respiratory: Negative for shortness  of breath.   Cardiovascular: Positive for chest pain. Negative for palpitations.  Gastrointestinal: Negative for nausea.  Neurological: Negative for dizziness and syncope.     Physical Exam Triage Vital Signs ED Triage Vitals  Enc Vitals Group     BP 11/18/20 1206 (!) 129/92     Pulse Rate 11/18/20 1206 70     Resp 11/18/20 1206 18     Temp 11/18/20 1206 98.3 F (36.8 C)     Temp Source 11/18/20 1206 Oral     SpO2 11/18/20 1206 99 %     Weight 11/18/20 1204 285 lb (129.3 kg)     Height 11/18/20 1204 6\' 4"  (1.93 m)     Head Circumference --      Peak Flow --      Pain Score 11/18/20 1203 2     Pain Loc --      Pain Edu? --      Excl. in GC? --    No data found.  Updated Vital Signs BP (!) 129/92 (BP Location: Left Arm)   Pulse 70   Temp 98.3 F (36.8 C) (Oral)   Resp 18   Ht 6\' 4"  (1.93 m)   Wt 285 lb (129.3 kg)   SpO2 99%   BMI 34.69 kg/m   Visual Acuity Right Eye Distance:   Left Eye Distance:   Bilateral Distance:    Right Eye Near:   Left Eye Near:    Bilateral Near:     Physical Exam Vitals and nursing note reviewed.  Constitutional:      General: He is not in acute distress.    Appearance: He is well-developed. He is obese. He is not ill-appearing or diaphoretic.  HENT:     Head: Normocephalic and atraumatic.  Cardiovascular:     Rate and Rhythm: Normal rate and regular rhythm.     Heart sounds: Normal heart sounds. No murmur heard.   Pulmonary:     Effort: Pulmonary effort is normal. No respiratory distress.     Breath sounds: No decreased breath sounds, wheezing or rhonchi.  Skin:    General: Skin is warm and dry.     Capillary Refill: Capillary refill takes less than 2 seconds.  Neurological:     General: No focal deficit present.     Mental Status: He is alert and oriented to person, place, and time.  Psychiatric:        Mood and Affect: Mood normal.        Behavior: Behavior normal.      UC Treatments / Results  Labs (all labs  ordered are listed, but only abnormal results are displayed) Labs Reviewed  TROPONIN  I (HIGH SENSITIVITY) - Abnormal; Notable for the following components:      Result Value   Troponin I (High Sensitivity) 86 (*)    All other components within normal limits    EKG   Radiology No results found.  Procedures Procedures (including critical care time)  Medications Ordered in UC Medications  aspirin chewable tablet 162 mg (162 mg Oral Given 11/18/20 1326)    Initial Impression / Assessment and Plan / UC Course  I have reviewed the triage vital signs and the nursing notes.  Pertinent labs & imaging results that were available during my care of the patient were reviewed by me and considered in my medical decision making (see chart for details).  Clinical Course as of 11/18/20 1330  Wed Nov 18, 2020  1313 Troponin I (High Sensitivity)(!): 86 [JR]    Clinical Course User Index [JR] Becky Augusta, NP  Patient is a very pleasant 40 year old male here for evaluation of central chest pain.  Patient is not having pain at present.  Pain does not sound cardiac due to the waxing and waning nature and the abatement with the use of antacids.  Physical exam reveals a healthy male who is in no acute distress.  He is nondiaphoretic.  Heart sounds are S1 is 2 and crisp, lung sounds are clear to auscultation all fields.  Patient has 2+ radial ulnar pulses.  No JVD on exam.  EKG collected at triage.  EKG shows normal sinus rhythm.  EKG is normal axis with heart rate of 68, PR interval 152, QRS is 114.  No ectopy or ST changes.  Suspect patient's chest pain is of GI origin.  Given the protracted time since onset will check 1 troponin and if negative have patient follow-up with his primary care provider.  Troponin I is 86.  Upon reassessment patient remains pain free.  Due to the elevated troponin concern for ACS exists.  Discussed the need for further cardiac evaluation and monitoring in the emergency  department.  Patient has consented to go by EMS.  Patient took 162 mg of aspirin prior to arrival will give another 162 mg of aspirin and insert a peripheral IV.  Report called to [facility] the charge nurse in the ER at [facility].  Report given to [facility] and care transferred.   Final Clinical Impressions(s) / UC Diagnoses   Final diagnoses:  Chest pain, unspecified type     Discharge Instructions     Patient transferred to [facility] via [facility].    ED Prescriptions    None     PDMP not reviewed this encounter.   Becky Augusta, NP 11/18/20 1330    Becky Augusta, NP 11/18/20 1800

## 2020-11-18 NOTE — ED Provider Notes (Signed)
[facility] Emergency Department Provider Note   ____________________________________________   Event Date/Time   First MD Initiated Contact with Patient 11/18/20 1543     (approximate)  I have reviewed the triage vital signs and the nursing notes.   HISTORY  Chief Complaint Chest Pain    HPI James [name] is a 40 y.o. male with past medical history of hypertension, hyperlipidemia, diabetes, and nonalcoholic cirrhosis who presents to the ED complaining of chest pain.  Patient reports that he was woken from sleep with pressure in the center of his chest around 230 this morning.  He states he initially thought it was reflux and took some Tums with relief.  He then had recurrence of chest pressure while sitting at work this afternoon.  He decided to be evaluated at urgent care, where his troponin was found to be mildly elevated.  He was given a total of four 81 mg baby aspirin and transferred to the ED via EMS.  Patient now states that chest pain has resolved.  He states he has been feeling well for the past couple of days and denies any fevers, cough, shortness of breath, nausea, pain or swelling in his legs.  He denies any significant cardiac history and has never seen a cardiologist.        Past Medical History:  Diagnosis Date  . Diabetes type 2, controlled (HCC)   . History of asthma childhood   dust mites  . History of pneumonia 09/2014   LLL  . HTN (hypertension)   . Major depressive disorder, recurrent episode, moderate (HCC) 12/26/2015  . Mixed hyperlipidemia   . NAFLD (nonalcoholic fatty liver disease)   . Obesity   . Suicidal thoughts 12/25/2015   s/p ER evaluation    Patient Active Problem List   Diagnosis Date Noted  . Tachycardia 06/19/2020  . Left leg pain 02/28/2020  . OSA (obstructive sleep apnea) 11/26/2019  . Folliculitis 11/26/2019  . Grade 3 hypertensive retinopathy, left 03/01/2019  . Left shoulder pain 05/29/2017  . Vitamin  B12 deficiency 03/18/2016  . Major depressive disorder, recurrent episode, moderate (HCC) 12/26/2015  . Foot pain, bilateral 02/03/2015  . Health maintenance examination 12/09/2014  . HTN (hypertension)   . Type 2 diabetes, uncontrolled, with neuropathy (HCC)   . Obesity, Class I, BMI 30.0-34.9 (see actual BMI)   . NAFLD (nonalcoholic fatty liver disease) 61/44/3154  . Dyslipidemia associated with type 2 diabetes mellitus (HCC) 11/20/2010    History reviewed. No pertinent surgical history.  Prior to Admission medications   Medication Sig Start Date End Date Taking? Authorizing Provider  atenolol (TENORMIN) 50 MG tablet Take 2 tablets by mouth once daily 08/21/20   [name], MD  citalopram (CELEXA) 20 MG tablet Take 1 tablet by mouth once daily 02/18/20   [name], MD  glimepiride (AMARYL) 4 MG tablet Take 1 tablet by mouth once daily with breakfast 09/07/20   [name], MD  lovastatin (MEVACOR) 40 MG tablet TAKE 1 TABLET BY MOUTH AT BEDTIME 06/22/20   [name], MD  metFORMIN (GLUCOPHAGE) 1000 MG tablet TAKE 1 TABLET BY MOUTH TWICE DAILY WITH MEALS 08/21/20   [name], MD  vitamin B-12 (V-R VITAMIN B-12) 500 MCG tablet Take 1 tablet (500 mcg total) by mouth daily. 06/15/18   [name], MD    Allergies Patient has no known allergies.  Family History  Problem Relation Age of Onset  . Diabetes Father   . Hypertension Father   .  CAD Father        22  . Diabetes Mother   . Hypertension Mother   . Diabetes Paternal Grandfather   . Cancer Maternal Grandmother        breast  . Bipolar disorder Maternal Aunt   . Suicidality Maternal Aunt   . Stroke Neg Hx     Social History Social History   Tobacco Use  . Smoking status: Never Smoker  . Smokeless tobacco: Never Used  Vaping Use  . Vaping Use: Never used  Substance Use Topics  . Alcohol use: No    Alcohol/week: 0.0 standard drinks    Comment: rare - 2 x year  . Drug use: No     Review of Systems  Constitutional: No fever/chills Eyes: No visual changes. ENT: No sore throat. Cardiovascular: Positive for chest pain. Respiratory: Denies shortness of breath. Gastrointestinal: No abdominal pain.  No nausea, no vomiting.  No diarrhea.  No constipation. Genitourinary: Negative for dysuria. Musculoskeletal: Negative for back pain. Skin: Negative for rash. Neurological: Negative for headaches, focal weakness or numbness.  ____________________________________________   PHYSICAL EXAM:  VITAL SIGNS: ED Triage Vitals  Enc Vitals Group     BP 11/18/20 1410 (!) 140/101     Pulse Rate 11/18/20 1410 77     Resp 11/18/20 1410 16     Temp 11/18/20 1410 98.6 F (37 C)     Temp Source 11/18/20 1410 Oral     SpO2 --      Weight 11/18/20 1416 285 lb (129.3 kg)     Height 11/18/20 1416 6\' 4"  (1.93 m)     Head Circumference --      Peak Flow --      Pain Score 11/18/20 1412 1     Pain Loc --      Pain Edu? --      Excl. in GC? --     Constitutional: Alert and oriented. Eyes: Conjunctivae are normal. Head: Atraumatic. Nose: No congestion/rhinnorhea. Mouth/Throat: Mucous membranes are moist. Neck: Normal ROM Cardiovascular: Normal rate, regular rhythm. Grossly normal heart sounds.  2+ radial pulses bilaterally. Respiratory: Normal respiratory effort.  No retractions. Lungs CTAB.  No chest wall tenderness to palpation. Gastrointestinal: Soft and nontender. No distention. Genitourinary: deferred Musculoskeletal: No lower extremity tenderness nor edema. Neurologic:  Normal speech and language. No gross focal neurologic deficits are appreciated. Skin:  Skin is warm, dry and intact. No rash noted. Psychiatric: Mood and affect are normal. Speech and behavior are normal.  ____________________________________________   LABS (all labs ordered are listed, but only abnormal results are displayed)  Labs Reviewed  BASIC METABOLIC PANEL - Abnormal; Notable for the  following components:      Result Value   Sodium 133 (*)    Glucose, Bld 180 (*)    All other components within normal limits  TROPONIN I (HIGH SENSITIVITY) - Abnormal; Notable for the following components:   Troponin I (High Sensitivity) 161 (*)    All other components within normal limits  RESP PANEL BY RT-PCR (FLU A&B, COVID) ARPGX2  CBC  TROPONIN I (HIGH SENSITIVITY)   ____________________________________________  EKG  ED ECG REPORT I, 01/18/21, the attending physician, personally viewed and interpreted this ECG.   Date: 11/18/2020  EKG Time: 14:04  Rate: 72  Rhythm: normal sinus rhythm  Axis: LAD  Intervals:none  ST&T Change: None   PROCEDURES  Procedure(s) performed (including Critical Care):  .Critical Care Performed by: 01/18/2021, MD Authorized by: Chesley Noon,  Leonette Most, MD   Critical care provider statement:    Critical care time (minutes):  45   Critical care time was exclusive of:  Separately billable procedures and treating other patients and teaching time   Critical care was necessary to treat or prevent imminent or life-threatening deterioration of the following conditions:  Cardiac failure   Critical care was time spent personally by me on the following activities:  Discussions with consultants, evaluation of patient's response to treatment, examination of patient, ordering and performing treatments and interventions, ordering and review of laboratory studies, ordering and review of radiographic studies, pulse oximetry, re-evaluation of patient's condition, obtaining history from patient or surrogate and review of old charts   I assumed direction of critical care for this patient from another provider in my specialty: no       ____________________________________________   INITIAL IMPRESSION / ASSESSMENT AND PLAN / ED COURSE       40 year old male with past medical history of hypertension, hyperlipidemia, diabetes, and nonalcoholic cirrhosis  who presents to the ED complaining of chest pressure present intermittently since 230 this morning.  Troponin noted to be mildly elevated at urgent care and is uptrending upon recheck here in the ED.  EKGs from both urgent care and here at [facility] show no ST or T wave changes, but presentation is concerning for NSTEMI and we will start patient on heparin.  Low suspicion for PE or dissection at this time as patient's chest pain has resolved.  Remainder of labs are unremarkable, chest x-ray reviewed by me and shows no infiltrate, edema, or effusion.  Plan to discuss with hospitalist for admission.      ____________________________________________   FINAL CLINICAL IMPRESSION(S) / ED DIAGNOSES  Final diagnoses:  NSTEMI (non-ST elevated myocardial infarction) [facility])     ED Discharge Orders    None       Note:  This document was prepared using Dragon voice recognition software and may include unintentional dictation errors.   Chesley Noon, MD 11/18/20 [phone]

## 2020-11-18 NOTE — H&P (Signed)
History and Physical    PLEASE NOTE THAT DRAGON DICTATION SOFTWARE WAS USED IN THE CONSTRUCTION OF THIS NOTE.   James [name] TFT:[id number] DOB: April 19, 1981 DOA: 11/18/2020  PCP: [name], MD Patient coming from: home   I have personally briefly reviewed patient's old medical records in [facility]  Chief Complaint: chest pain  HPI: James [name] is a 40 y.o. male with medical history significant for hypertension, hyperlipidemia, type 2 diabetes mellitus, obesity, nonalcoholic fatty liver disease, depression, who is admitted to [facility] on 11/18/2020 with NSTEMi after presenting from home to [facility] ED complaining of chest pain.   The patient reports 2 episodes of nonradiating substernal chest pressure over the last day.  The first such episode woke him up from sleep at approximately 230 AM this morning.  Reports that this episode lasted for approximately 20 to 25 minutes, and improved after taking Tums before completely resolving in the absence of any nitroglycerin.  States that this episode was nonpositional, nonpleuritic, nonexertional, and not reproducible with direct palpation the anterior chest wall.  Denies any associated shortness of breath, palpitations, diaphoresis, nausea, vomiting, dizziness, presyncope, or syncope.  Patient reports that he subsequently woke back up at 6 AM in order to cure for, at which time he noted no residual chest discomfort.  Subsequently, while sitting at work he developed a second episode of substernal chest pressure at approximately 11 AM today.  Reports this episode was similar in quality, intensity, location, and without any exacerbating factors, including nonexertional.  Similar to the episode that he had experienced early in the morning, the second episode was also self-limited, completely resolving after 20 to 30 minutes, this time without any pharmacologic intervention, including no nitroglycerin.  He reports 1 prior  episode of chest discomfort similar to the above that occurred on Saturday, 11/14/2020, although with this prior episode stated to be shorter in duration, lasting 5-10 minutes, was similar to the above in terms of intensity, location, quality.  This first episode occurring also resolved spontaneously without NG.  Outside of these 3 episodes of chest discomfort, the patient denies any prior episodes of similar pain.   The patient acknowledges medical history is notable for hypertension, hyperlipidemia, and type 2 diabetes mellitus.  Denies any known underlying cardiac history, and has not previously seen a cardiologist.  Reports that he is a lifelong non-smoker denies recreational drug use.  He reports he history of coronary disease in his father, which was diagnosed at age 22, but otherwise denies any known history of CAD in his family.  Patient is not previously undergone any form of ischemic evaluation in terms of no prior stress test or echocardiogram.  Denies any personal or family history of DVT/PE or inheritable hypercoagulable state.  Denies any recent trauma, travel, surgery, extended periods of diminishing ambulatory activity, or any personal history of malignancy.  Denies any new peripheral swelling, erythema, calf tenderness, or hemoptysis.   Denies any associated subjective fever, chills, rigors, or generalized myalgias.  Denies any associated shortness of breath or cough.  Also denies any recent headaches, neck stiffness, rhinitis, rhinorrhea, sore throat, abdominal pain, diarrhea, or rash. No recent traveling or known COVID-19 exposures. Denies dysuria, gross hematuria, or change in urinary urgency/frequency.   After resolution of the patient's most recent episode of chest pain around 1130 this morning while at work, he presented to local urgent care for further evaluation of such, at which time initial troponin was noted to be mildly elevated at  86, prompting the patient to be taken via EMS to  [facility] ED for further evaluation.  He reportedly had aspirin 81 mg p.o. x4 in route to the hospital.  He currently reports no residual chest discomfort.     ED Course:  Vital signs in the ED were notable for the following: Tetramex 90.6, heart rate 77, blood pressure 140/101, respiratory rate 16.  Labs were notable for the following: BMP was notable for the following: Sodium 133, potassium 4.2, bicarbonate 23, BUN 9, creatinine 0.8.  CBC notable for white blood cell count 8000, hemoglobin 15.5.  INR 1.0.  Relative to high-sensitivity troponin I drawn at the urgent care earlier today, repeat high-sensitivity troponin I in the ED today was found to be 161.  Nasopharyngeal COVID-19 PCR was performed in the ED today, with result currently pending.  Presenting EKG, by way of comparison to most recent prior from April 2014 showed sinus rhythm with heart rate 74, QRS 119, QTc 460, nonspecific T wave inversion in lead III, which was unchanged from most recent prior EKG, no evidence of ST changes, including no evidence of ST elevation.  2 view chest x-ray showed no evidence of acute cardiopulmonary process, including no evidence of infiltrate, edema, effusion, or pneumothorax.  While in the ED, the following were administered: Heparin bolus followed by initiation of heparin drip by way of inpatient pharmacy consultation.      Review of Systems: As per HPI otherwise 10 point review of systems negative.   Past Medical History:  Diagnosis Date  . Diabetes type 2, controlled (HCC)   . History of asthma childhood   dust mites  . History of pneumonia 09/2014   LLL  . HTN (hypertension)   . Major depressive disorder, recurrent episode, moderate (HCC) 12/26/2015  . Mixed hyperlipidemia   . NAFLD (nonalcoholic fatty liver disease)   . Obesity   . Suicidal thoughts 12/25/2015   s/p ER evaluation    History reviewed. No pertinent surgical history.  Social History:  reports that he has never smoked. He  has never used smokeless tobacco. He reports that he does not drink alcohol and does not use drugs.   No Known Allergies  Family History  Problem Relation Age of Onset  . Diabetes Father   . Hypertension Father   . CAD Father        106  . Diabetes Mother   . Hypertension Mother   . Diabetes Paternal Grandfather   . Cancer Maternal Grandmother        breast  . Bipolar disorder Maternal Aunt   . Suicidality Maternal Aunt   . Stroke Neg Hx     Prior to Admission medications   Medication Sig Start Date End Date Taking? Authorizing Provider  atenolol (TENORMIN) 50 MG tablet Take 2 tablets by mouth once daily 08/21/20   [name], MD  citalopram (CELEXA) 20 MG tablet Take 1 tablet by mouth once daily 02/18/20   [name], MD  glimepiride (AMARYL) 4 MG tablet Take 1 tablet by mouth once daily with breakfast 09/07/20   [name], MD  lovastatin (MEVACOR) 40 MG tablet TAKE 1 TABLET BY MOUTH AT BEDTIME 06/22/20   [name], MD  metFORMIN (GLUCOPHAGE) 1000 MG tablet TAKE 1 TABLET BY MOUTH TWICE DAILY WITH MEALS 08/21/20   [name], MD  vitamin B-12 (V-R VITAMIN B-12) 500 MCG tablet Take 1 tablet (500 mcg total) by mouth daily. 06/15/18   [name], MD  Objective    Physical Exam: Vitals:   11/18/20 1410 11/18/20 1416  BP: (!) 140/101   Pulse: 77   Resp: 16   Temp: 98.6 F (37 C)   TempSrc: Oral   Weight:  129.3 kg  Height:  6\' 4"  (1.93 m)    General: appears to be stated age; alert, oriented Skin: warm, dry, no rash Head:  AT/[place] Mouth:  Oral mucosa membranes appear moist, normal dentition Neck: supple; trachea midline Chest: No evidence of chest discomfort with palpation over the anterior chest wall.  No evidence of paradoxical chest motion. Heart:  RRR; did not appreciate any M/R/G Lungs: CTAB, did not appreciate any wheezes, rales, or rhonchi Abdomen: + BS; soft, ND, NT Vascular: 2+ pedal pulses b/l; 2+ radial pulses  b/l Extremities: no peripheral edema, no muscle wasting Neuro: strength and sensation intact in upper and lower extremities b/l    Labs on Admission: I have personally reviewed following labs and imaging studies  CBC: Recent Labs  Lab 11/18/20 1411  WBC 8.0  HGB 15.5  HCT 44.7  MCV 84.0  PLT 188   Basic Metabolic Panel: Recent Labs  Lab 11/18/20 1411  NA 133*  K 4.2  CL 100  CO2 23  GLUCOSE 180*  BUN 9  CREATININE 0.80  CALCIUM 9.7   GFR: Estimated Creatinine Clearance: 180.2 mL/min (by C-G formula based on SCr of 0.8 mg/dL). Liver Function Tests: No results for input(s): AST, ALT, ALKPHOS, BILITOT, PROT, ALBUMIN in the last 168 hours. No results for input(s): LIPASE, AMYLASE in the last 168 hours. No results for input(s): AMMONIA in the last 168 hours. Coagulation Profile: Recent Labs  Lab 11/18/20 1606  INR 1.0   Cardiac Enzymes: No results for input(s): CKTOTAL, CKMB, CKMBINDEX, TROPONINI in the last 168 hours. BNP (last 3 results) No results for input(s): PROBNP in the last 8760 hours. HbA1C: No results for input(s): HGBA1C in the last 72 hours. CBG: No results for input(s): GLUCAP in the last 168 hours. Lipid Profile: No results for input(s): CHOL, HDL, LDLCALC, TRIG, CHOLHDL, LDLDIRECT in the last 72 hours. Thyroid Function Tests: No results for input(s): TSH, T4TOTAL, FREET4, T3FREE, THYROIDAB in the last 72 hours. Anemia Panel: No results for input(s): VITAMINB12, FOLATE, FERRITIN, TIBC, IRON, RETICCTPCT in the last 72 hours. Urine analysis:    Component Value Date/Time   COLORURINE YELLOW 01/11/2013 1131   APPEARANCEUR CLEAR 01/11/2013 1131   LABSPEC >=1.030 01/11/2013 1131   PHURINE 6.0 01/11/2013 1131   GLUCOSEU NEGATIVE 01/11/2013 1131   HGBUR NEGATIVE 01/11/2013 1131   BILIRUBINUR NEGATIVE 01/11/2013 1131   KETONESUR NEGATIVE 01/11/2013 1131   UROBILINOGEN 0.2 01/11/2013 1131   NITRITE NEGATIVE 01/11/2013 1131   LEUKOCYTESUR NEGATIVE  01/11/2013 1131    Radiological Exams on Admission: DG Chest 2 View  Result Date: 11/18/2020 CLINICAL DATA:  Chest pain EXAM: CHEST - 2 VIEW COMPARISON:  10/08/2014 FINDINGS: Heart and mediastinal contours are within normal limits. No focal opacities or effusions. No acute bony abnormality. IMPRESSION: No active cardiopulmonary disease. Electronically Signed   By: 10/10/2014 M.D.   On: 11/18/2020 15:03     EKG: Independently reviewed, with result as described above.    Assessment/Plan   James [name] is a 40 y.o. male with medical history significant for hypertension, hyperlipidemia, type 2 diabetes mellitus, obesity, nonalcoholic fatty liver disease, depression, who is admitted to [facility] on 11/18/2020 with NSTEMi after presenting from home to [facility] ED complaining of  chest pain.    Principal Problem:   NSTEMI (non-ST elevated myocardial infarction) (HCC) Active Problems:   Dyslipidemia associated with type 2 diabetes mellitus (HCC)   NAFLD (nonalcoholic fatty liver disease)   HTN (hypertension)   Type 2 diabetes, uncontrolled, with neuropathy (HCC)   Chest pain    #) NSTEMI: Diagnosis on the basis of 2 episodes of substernal chest pressure over the last day, associated with elevated presenting high-sensitivity troponin I, which was initially noted to be 86, with ensuing increase to 161 in the absence of any prior troponin values reported comparison, with EKG showing sinus rhythm in the absence of acute ischemic changes, including no evidence of ST elevation.  No known history of underlying coronary disease, and no prior cardiac ischemic evaluation, although the patient does possess multiple CAD risk factors, including history of hypertension, hyperlipidemia, DM2, and obesity.  Currently completely chest pain-free in the absence of any NG.  Received aspirin 81 mg p.o. x4 in route to the hospital today.  In terms of medical management for suspected NSTEMI, will  hold home lovastatin for now and initiate high intensity atorvastatin for plaque stabilization and anti-inflammatory properties.  Likewise, will hold home atenolol for now, and start low-dose metoprolol tartrate in order to decrease cardiac oxygen demand.  Additionally, heparin drip has been initiated following initial bolus in the setting of pharmacy consult.  Also check D-dimer, which, if positive, will consider pursuing CTA chest in order to rule out acute pulmonary embolism.    Plan: High intensity atorvastatin 40 mg p.o. nightly, with first dose now, as further described above.  Metoprolol tartrate 12.5 mg p.o. twice daily.  Continue heparin drip by way of pharmacy consult.  Continue to trend serial troponin.  Monitor on telemetry.  Add on serum magnesium level, with plan to provide as needed supplementation in order to maintain levels of greater than or equal to 2.0 in order to reduce likelihood of ventricular arrhythmia. Cardiology consult.  D-dimer, with plan to pursue CTA chest if positive.  Monitor continuous pulse oximetry.  CMP and CBC in the morning.  Check urinary drug screen.  As needed sublingual nitroglycerin for additional chest pain..  EKG for additional chest pain.  Echocardiogram has been ordered for the morning to evaluate for focal wall motion abnormalities.       #) Atypical Chest Pain: a total of 3 episodes over last 5 days of non-radiating substernal chest pressure occurring at rest that is nonexertional and resolves spontaneously in the absence of any nitroglycerin, which would appear to be atypical for ACS, although this in the context of mildlly elevating high-sensitivity troponin I. In context of no associated acute ischemic changes on EKG, including no STEMI, appears to be criteria for NSTEMI at present, as further described above.  In the context of no known CAD, but with the presence of multiple CAD risk factors, as outlined above, and no previous cardiac ischemic  evaluation, including no stress test. CXR shows no acute process, including no evidence of pneumothorax. Currently completely chest pain-free. S/p full dose aspirin today, as above.  In addition to ACS, differential also includes acute pulmonary embolism, which will be evaluated with initial D-dimer followed by pursue CTA chest positive, as further described above. DDx also includes GERD versus gastritis vs peptic ulcer disease, given improvement with Tums taken earlier today vs esophageal spasm vs coronary artery vasospasm vs musculoskeletal etiologies, including costochondritis.  will also check liver enzymes as well as lipase to evaluate for evidence  of acute pancreatitis.  No evidence of radiation of his pain to the back or between the shoulder blades to increase suspicion for aortic dissection.   Plan: trend serial troponin. Monitor on telemetry. PRN sublingual nitroglycerin. PRN EKG for subsequent episodes of chest pain.  Add on serum magnesium level, with as needed IV supplementation, as above.  Check lipase.  CMP and CBC in the morning.  Monitor on continuous pulse oximetry.  D-dimer, as above.  Echocardiogram has been ordered for the morning, as further described above.  Additional medical management of NSTEMI, as further described above, including high intensity atorvastatin, beta-blocker, and continuation of heparin drip. Cardiology consult.  Urinary drug screen.       #) Hyperlipidemia: On lovastatin as an outpatient.  In the setting of presenting NSTEMI, will hold home lovastatin for now, and initiate high intensity atorvastatin for plaque stabilization qualities, as above.  Plan: Atorvastatin milligrams p.o. nightly, first dose now.  Hold home lovastatin for now.     #) Essential hypertension: Documented history of such, on atenolol as sole outpatient antihypertensive agent.  Presenting systolic pressure noted to be in the 140s mmHg.   Plan: In the setting of presenting NSTEMI, will  hold home atenolol for now, initiate low-dose metoprolol tartrate to decrease cardiac oxygen demand.  Monitor on telemetry.      #) Type 2 diabetes mellitus: Most recent hemoglobin A1c noted to be 7.2% when checked in September 2021.  Not on any exogenous insulin at home.  Rather, he is on Metformin glimepiride as an outpatient.  Presenting blood sugar per presenting BMP noted to be 180.  Plan: Hold home oral hypoglycemic agents during this hospitalization.  Accu-Cheks before every meal and at bedtime with low-dose sliding scale insulin.  Also repeat hemoglobin A1c.       #) NAFLD: Documented history of such.  INR found to be nonelevated at 1.0.  No evidence of overt peripheral edema or ascites at this time.  Will also check liver enzymes, as noted below.  Denies any routine or recent alcohol consumption.  Plan: Monitor strict I's and O's and daily weights.  Check CMP in the morning as well as INR at that time.       #) Depression: On Celexa as an outpatient.   Plan: continue home Celexa. Will also check urinary drug screen in setting of presenting chest pain with elevated troponin, as above.      DVT prophylaxis: On heparin drip Code Status: Full code Family Communication: none Disposition Plan: Per Rounding Team Admission status: inpatient; pcu    Of note, this patient was added by me to the following Admit List/Treatment Team: [facility].      PLEASE NOTE THAT DRAGON DICTATION SOFTWARE WAS USED IN THE CONSTRUCTION OF THIS NOTE.   Angie Fava DO [facility] Pager [phone] From 12PM - 12AM  Otherwise, please contact night-coverage  [email or web address] Password [facility]   11/18/2020, 4:56 PM

## 2020-11-18 NOTE — ED Triage Notes (Signed)
Pt comes via EMS from [facility] with c/o central CP. Pt states this started this am. Pt took some tums with no relief. Pt states he later took aspirin and still no relief.  Pt denies any SOB. Pt states 1/10 pain at this time. Pt states no radiation.

## 2020-11-18 NOTE — Consult Note (Signed)
ANTICOAGULATION CONSULT NOTE - Initial Consult  Pharmacy Consult for Heparin infusion Indication: chest pain/ACS  No Known Allergies  Patient Measurements: Height: 6\' 4"  (193 cm) Weight: 129.3 kg (285 lb) IBW/kg (Calculated) : 86.8 Heparin Dosing Weight: 114.4 kg  Vital Signs: Temp: 98.6 F (37 C) (03/02 1410) Temp Source: Oral (03/02 1410) BP: 140/101 (03/02 1410) Pulse Rate: 77 (03/02 1410)  Labs: Recent Labs    11/18/20 1226 11/18/20 1411 11/18/20 1606  HGB  --  15.5  --   HCT  --  44.7  --   PLT  --  188  --   APTT  --   --  27  LABPROT  --   --  13.1  INR  --   --  1.0  CREATININE  --  0.80  --   TROPONINIHS 86* 161*  --     Estimated Creatinine Clearance: 180.2 mL/min (by C-G formula based on SCr of 0.8 mg/dL).   Medical History: Past Medical History:  Diagnosis Date  . Diabetes type 2, controlled (HCC)   . History of asthma childhood   dust mites  . History of pneumonia 09/2014   LLL  . HTN (hypertension)   . Major depressive disorder, recurrent episode, moderate (HCC) 12/26/2015  . Mixed hyperlipidemia   . NAFLD (nonalcoholic fatty liver disease)   . Obesity   . Suicidal thoughts 12/25/2015   s/p ER evaluation    Medications:  No prior anticoagulation noted in chart review or PTA med list review   Assessment: 40 y.o. male with past medical history of hypertension, hyperlipidemia, diabetes who presents to the ED complaining of chest pain. Pharmacy has been consulted for initiation and management of heparin infusion for ACS.  Baseline labs ordered and reviewed  Heparin Dosing Weight: 114.4 kg  Goal of Therapy:  Heparin level 0.3-0.7 units/ml Monitor platelets by anticoagulation protocol: Yes   Plan:  Give 4000 units bolus x 1 Start heparin infusion at 1600 units/hr Check anti-Xa level in 6 hours and daily while on heparin Continue to monitor H&H and platelets  41, PharmD, BCPS Clinical Pharmacist  11/18/2020,4:39 PM

## 2020-11-18 NOTE — ED Triage Notes (Signed)
Reported from [facility] trop 86

## 2020-11-18 NOTE — ED Triage Notes (Signed)
First RN Note: Pt to ED via [facility] from [facility] with c/o CP that started at approx 0200-0300 this morning, took some Tums with relief. Per notes from [facility] pt took 2 baby ASA prior to arrival at [facility] with relief.   158/100 86HR 97% RA CBG 214

## 2020-11-19 ENCOUNTER — Inpatient Hospital Stay
Admit: 2020-11-19 | Discharge: 2020-11-19 | Disposition: A | Discharge status: Home / Self Care | Payer: PRIVATE HEALTH INSURANCE | Attending: Internal Medicine | Admitting: Internal Medicine

## 2020-11-19 ENCOUNTER — Encounter
Admission: EM | Disposition: A | Discharge status: Home / Self Care | Payer: Self-pay | Source: Home / Self Care | Attending: Internal Medicine

## 2020-11-19 DIAGNOSIS — R079 Chest pain, unspecified: Secondary | ICD-10-CM

## 2020-11-19 HISTORY — PX: CORONARY STENT INTERVENTION: CATH118234

## 2020-11-19 HISTORY — PX: LEFT HEART CATH AND CORONARY ANGIOGRAPHY: CATH118249

## 2020-11-19 LAB — ECHOCARDIOGRAM COMPLETE
AR max vel: 3.54 cm2
AV Area VTI: 3.49 cm2
AV Area mean vel: 3.3 cm2
AV Mean grad: 3 mmHg
AV Peak grad: 6 mmHg
Ao pk vel: 1.22 m/s
Area-P 1/2: 7.16 cm2
Calc EF: 55.4 %
Height: 76 in
MV VTI: 4.8 cm2
Single Plane A2C EF: 54.4 %
Single Plane A4C EF: 59.2 %
Weight: 4560 oz

## 2020-11-19 LAB — COMPREHENSIVE METABOLIC PANEL
ALT: 48 U/L — ABNORMAL HIGH (ref 0–44)
AST: 52 U/L — ABNORMAL HIGH (ref 15–41)
Albumin: 4.5 g/dL (ref 3.5–5.0)
Alkaline Phosphatase: 55 U/L (ref 38–126)
Anion gap: 10 (ref 5–15)
BUN: 12 mg/dL (ref 6–20)
CO2: 24 mmol/L (ref 22–32)
Calcium: 9.3 mg/dL (ref 8.9–10.3)
Chloride: 101 mmol/L (ref 98–111)
Creatinine, Ser: 0.82 mg/dL (ref 0.61–1.24)
GFR, Estimated: >60 mL/min (ref >60)
Glucose, Bld: 198 mg/dL — ABNORMAL HIGH (ref 70–99)
Potassium: 4.7 mmol/L (ref 3.5–5.1)
Sodium: 135 mmol/L (ref 135–145)
Total Bilirubin: 1.6 mg/dL — ABNORMAL HIGH (ref 0.3–1.2)
Total Protein: 7.9 g/dL (ref 6.5–8.1)

## 2020-11-19 LAB — CBC
HCT: 44.2 % (ref 39.0–52.0)
Hemoglobin: 15.4 g/dL (ref 13.0–17.0)
MCH: 29.4 pg (ref 26.0–34.0)
MCHC: 34.8 g/dL (ref 30.0–36.0)
MCV: 84.4 fL (ref 80.0–100.0)
Platelets: 177 10*3/uL (ref 150–400)
RBC: 5.24 MIL/uL (ref 4.22–5.81)
RDW: 12.6 % (ref 11.5–15.5)
WBC: 9.4 10*3/uL (ref 4.0–10.5)
nRBC: 0 % (ref 0.0–0.2)

## 2020-11-19 LAB — GLUCOSE, CAPILLARY
Glucose-Capillary: 227 mg/dL — ABNORMAL HIGH (ref 70–99)
Glucose-Capillary: 184 mg/dL — ABNORMAL HIGH (ref 70–99)
Glucose-Capillary: 130 mg/dL — ABNORMAL HIGH (ref 70–99)
Glucose-Capillary: 229 mg/dL — ABNORMAL HIGH (ref 70–99)

## 2020-11-19 LAB — PROTIME-INR
INR: 1.1 (ref 0.8–1.2)
Prothrombin Time: 13.5 seconds (ref 11.4–15.2)

## 2020-11-19 LAB — CBG MONITORING, ED
Glucose-Capillary: 243 mg/dL — ABNORMAL HIGH (ref 70–99)
Glucose-Capillary: 234 mg/dL — ABNORMAL HIGH (ref 70–99)

## 2020-11-19 LAB — HIV ANTIBODY (ROUTINE TESTING W REFLEX): HIV Screen 4th Generation wRfx: NONREACTIVE

## 2020-11-19 LAB — TROPONIN I (HIGH SENSITIVITY): Troponin I (High Sensitivity): 1445 ng/L (ref <18)

## 2020-11-19 LAB — TSH: TSH: 2.021 u[IU]/mL (ref 0.350–4.500)

## 2020-11-19 LAB — MAGNESIUM: Magnesium: 1.9 mg/dL (ref 1.7–2.4)

## 2020-11-19 LAB — HEPARIN LEVEL (UNFRACTIONATED)
Heparin Unfractionated: <0.1 IU/mL — ABNORMAL LOW (ref 0.30–0.70)
Heparin Unfractionated: 0.19 IU/mL — ABNORMAL LOW (ref 0.30–0.70)
Heparin Unfractionated: <0.1 IU/mL — ABNORMAL LOW (ref 0.30–0.70)

## 2020-11-19 SURGERY — LEFT HEART CATH AND CORONARY ANGIOGRAPHY
Anesthesia: Moderate Sedation

## 2020-11-19 MED ORDER — ACETAMINOPHEN 325 MG PO TABS: 650.0000 mg | ORAL_TABLET | ORAL | Status: DC | PRN

## 2020-11-19 MED ORDER — ASPIRIN 81 MG PO CHEW: 81.0000 mg | CHEWABLE_TABLET | ORAL | Status: AC

## 2020-11-19 MED ORDER — SODIUM CHLORIDE 0.9 % IV SOLN: 250.0000 mL | INTRAVENOUS | Status: DC | PRN

## 2020-11-19 MED ORDER — ASPIRIN 81 MG PO CHEW
CHEWABLE_TABLET | ORAL | Status: AC
  Administered 2020-11-19: 81 mg via ORAL
  Filled 2020-11-19: qty 1

## 2020-11-19 MED ORDER — ASPIRIN EC 81 MG PO TBEC
81.0000 mg | DELAYED_RELEASE_TABLET | Freq: Every day | ORAL | Status: DC
  Administered 2020-11-20: 81 mg via ORAL
  Filled 2020-11-19: qty 1

## 2020-11-19 MED ORDER — FENTANYL CITRATE (PF) 100 MCG/2ML IJ SOLN
INTRAMUSCULAR | Status: AC
  Filled 2020-11-19: qty 2

## 2020-11-19 MED ORDER — HEPARIN (PORCINE) IN NACL 2000-0.9 UNIT/L-% IV SOLN
INTRAVENOUS | Status: DC | PRN
  Administered 2020-11-19: 500 mL

## 2020-11-19 MED ORDER — MIDAZOLAM HCL 2 MG/2ML IJ SOLN
INTRAMUSCULAR | Status: DC | PRN
  Administered 2020-11-19: 1 mg via INTRAVENOUS

## 2020-11-19 MED ORDER — ASPIRIN 81 MG PO CHEW
CHEWABLE_TABLET | ORAL | Status: AC
  Filled 2020-11-19: qty 3

## 2020-11-19 MED ORDER — BIVALIRUDIN TRIFLUOROACETATE 250 MG IV SOLR
INTRAVENOUS | Status: AC
  Filled 2020-11-19: qty 250

## 2020-11-19 MED ORDER — TICAGRELOR 90 MG PO TABS
ORAL_TABLET | ORAL | Status: DC | PRN
  Administered 2020-11-19: 180 mg via ORAL

## 2020-11-19 MED ORDER — PERFLUTREN LIPID MICROSPHERE
1.0000 mL | INTRAVENOUS | Status: AC | PRN
  Administered 2020-11-19: 2 mL via INTRAVENOUS
  Filled 2020-11-19: qty 10

## 2020-11-19 MED ORDER — FENTANYL CITRATE (PF) 100 MCG/2ML IJ SOLN
INTRAMUSCULAR | Status: DC | PRN
  Administered 2020-11-19: 50 ug via INTRAVENOUS

## 2020-11-19 MED ORDER — SODIUM CHLORIDE 0.9 % WEIGHT BASED INFUSION: 3.0000 mL/kg/h | INTRAVENOUS | Status: DC

## 2020-11-19 MED ORDER — IOHEXOL 300 MG/ML  SOLN
INTRAMUSCULAR | Status: DC | PRN
  Administered 2020-11-19: 220 mL via INTRA_ARTERIAL

## 2020-11-19 MED ORDER — LIDOCAINE HCL (PF) 1 % IJ SOLN
INTRAMUSCULAR | Status: DC | PRN
  Administered 2020-11-19: 10 mL

## 2020-11-19 MED ORDER — SODIUM CHLORIDE 0.9% FLUSH: 3.0000 mL | INTRAVENOUS | Status: DC | PRN

## 2020-11-19 MED ORDER — MIDAZOLAM HCL 2 MG/2ML IJ SOLN
INTRAMUSCULAR | Status: AC
  Filled 2020-11-19: qty 2

## 2020-11-19 MED ORDER — CLOPIDOGREL BISULFATE 75 MG PO TABS: ORAL_TABLET | ORAL | Status: DC | PRN

## 2020-11-19 MED ORDER — IOHEXOL 300 MG/ML  SOLN
INTRAMUSCULAR | Status: DC | PRN
  Administered 2020-11-19: 75 mL

## 2020-11-19 MED ORDER — BIVALIRUDIN BOLUS VIA INFUSION - CUPID
INTRAVENOUS | Status: DC | PRN
  Administered 2020-11-19: 96.975 mg via INTRAVENOUS

## 2020-11-19 MED ORDER — SODIUM CHLORIDE 0.9 % WEIGHT BASED INFUSION
1.0000 mL/kg/h | INTRAVENOUS | Status: AC
  Administered 2020-11-20: 1 mL/kg/h via INTRAVENOUS

## 2020-11-19 MED ORDER — LISINOPRIL 5 MG PO TABS
5.0000 mg | ORAL_TABLET | Freq: Every day | ORAL | Status: DC
  Administered 2020-11-20: 5 mg via ORAL
  Filled 2020-11-19: qty 1

## 2020-11-19 MED ORDER — HEPARIN BOLUS VIA INFUSION
3000.0000 [IU] | Freq: Once | INTRAVENOUS | Status: AC
  Administered 2020-11-19: 3000 [IU] via INTRAVENOUS
  Filled 2020-11-19: qty 3000

## 2020-11-19 MED ORDER — TICAGRELOR 90 MG PO TABS
90.0000 mg | ORAL_TABLET | Freq: Two times a day (BID) | ORAL | Status: DC
  Administered 2020-11-20: 90 mg via ORAL
  Filled 2020-11-19: qty 1

## 2020-11-19 MED ORDER — HEPARIN (PORCINE) IN NACL 1000-0.9 UT/500ML-% IV SOLN
INTRAVENOUS | Status: AC
  Filled 2020-11-19: qty 1000

## 2020-11-19 MED ORDER — ASPIRIN 81 MG PO CHEW: 81.0000 mg | CHEWABLE_TABLET | Freq: Every day | ORAL | Status: DC

## 2020-11-19 MED ORDER — ATORVASTATIN CALCIUM 80 MG PO TABS
80.0000 mg | ORAL_TABLET | Freq: Every day | ORAL | Status: DC
  Administered 2020-11-19: 80 mg via ORAL
  Filled 2020-11-19: qty 4

## 2020-11-19 MED ORDER — ASPIRIN 81 MG PO CHEW
CHEWABLE_TABLET | ORAL | Status: DC | PRN
  Administered 2020-11-19: 243 mg via ORAL

## 2020-11-19 MED ORDER — LIDOCAINE HCL (PF) 1 % IJ SOLN
INTRAMUSCULAR | Status: AC
  Filled 2020-11-19: qty 30

## 2020-11-19 MED ORDER — TICAGRELOR 90 MG PO TABS
ORAL_TABLET | ORAL | Status: AC
  Filled 2020-11-19: qty 2

## 2020-11-19 MED ORDER — ONDANSETRON HCL 4 MG/2ML IJ SOLN: 4.0000 mg | Freq: Four times a day (QID) | INTRAMUSCULAR | Status: DC | PRN

## 2020-11-19 MED ORDER — HEPARIN BOLUS VIA INFUSION
4000.0000 [IU] | Freq: Once | INTRAVENOUS | Status: AC
  Administered 2020-11-19: 4000 [IU] via INTRAVENOUS
  Filled 2020-11-19: qty 4000

## 2020-11-19 MED ORDER — SODIUM CHLORIDE 0.9% FLUSH: 3.0000 mL | Freq: Two times a day (BID) | INTRAVENOUS | Status: DC

## 2020-11-19 MED ORDER — SODIUM CHLORIDE 0.9 % WEIGHT BASED INFUSION: 1.0000 mL/kg/h | INTRAVENOUS | Status: DC

## 2020-11-19 MED ORDER — LABETALOL HCL 5 MG/ML IV SOLN: 10.0000 mg | INTRAVENOUS | Status: AC | PRN

## 2020-11-19 MED ORDER — HYDRALAZINE HCL 20 MG/ML IJ SOLN: 10.0000 mg | INTRAMUSCULAR | Status: AC | PRN

## 2020-11-19 MED ORDER — SODIUM CHLORIDE 0.9 % IV SOLN
INTRAVENOUS | Status: AC | PRN
  Administered 2020-11-19 (×2): 1.75 mg/kg/h via INTRAVENOUS

## 2020-11-19 SURGICAL SUPPLY — 19 items
BALLN TREK RX 2.25X15 (BALLOONS) ×2
BALLN ~~LOC~~ TREK RX 2.5X15 (BALLOONS) ×2
BALLOON TREK RX 2.25X15 (BALLOONS) IMPLANT
BALLOON ~~LOC~~ TREK RX 2.5X15 (BALLOONS) IMPLANT
CATH INFINITI 5FR JL4 (CATHETERS) ×1 IMPLANT
CATH INFINITI JR4 5F (CATHETERS) ×1 IMPLANT
CATH VISTA GUIDE 6FR XB3.5 (CATHETERS) ×1 IMPLANT
DEVICE CLOSURE MYNXGRIP 6/7F (Vascular Products) ×1 IMPLANT
KIT ENCORE 26 ADVANTAGE (KITS) ×1 IMPLANT
KIT MANI 3VAL PERCEP (MISCELLANEOUS) ×1 IMPLANT
NDL PERC 18GX7CM (NEEDLE) IMPLANT
NEEDLE PERC 18GX7CM (NEEDLE) ×2 IMPLANT
PACK CARDIAC CATH (CUSTOM PROCEDURE TRAY) ×2 IMPLANT
SHEATH AVANTI 5FR X 11CM (SHEATH) ×1 IMPLANT
SHEATH AVANTI 6FR X 11CM (SHEATH) ×1 IMPLANT
STENT SYNERGY DES 2.25X20 (Permanent Stent) ×1 IMPLANT
STENT SYNERGY DES 2.25X28 (Permanent Stent) IMPLANT
WIRE ASAHI PROWATER 180CM (WIRE) ×1 IMPLANT
WIRE GUIDERIGHT .035X150 (WIRE) ×1 IMPLANT

## 2020-11-19 NOTE — Consult Note (Addendum)
ANTICOAGULATION CONSULT NOTE   Pharmacy Consult for Heparin infusion Indication: chest pain/ACS  No Known Allergies  Patient Measurements: Height: 6\' 4"  (193 cm) Weight: 129.3 kg (285 lb) IBW/kg (Calculated) : 86.8 Heparin Dosing Weight: 114.4 kg  Vital Signs: BP: 117/81 (03/03 0630) Pulse Rate: 81 (03/03 0630)  Labs: Recent Labs    11/18/20 1226 11/18/20 1411 11/18/20 1606 11/18/20 2207 11/19/20 0408 11/19/20 0522  HGB  --  15.5  --   --  15.4  --   HCT  --  44.7  --   --  44.2  --   PLT  --  188  --   --  177  --   APTT  --   --  27  --   --   --   LABPROT  --   --  13.1  --   --  13.5  INR  --   --  1.0  --   --  1.1  HEPARINUNFRC  --   --   --  0.51  --  <0.10*  CREATININE  --  0.80  --   --   --  0.82  TROPONINIHS 86* 161* 410*  --   --   --     Estimated Creatinine Clearance: 175.8 mL/min (by C-G formula based on SCr of 0.82 mg/dL).   Medical History: Past Medical History:  Diagnosis Date  . Diabetes type 2, controlled (HCC)   . History of asthma childhood   dust mites  . History of pneumonia 09/2014   LLL  . HTN (hypertension)   . Major depressive disorder, recurrent episode, moderate (HCC) 12/26/2015  . Mixed hyperlipidemia   . NAFLD (nonalcoholic fatty liver disease)   . Obesity   . Suicidal thoughts 12/25/2015   s/p ER evaluation    Medications:  No prior anticoagulation noted in chart review or PTA med list review   Assessment: 40 y.o. male with past medical history of hypertension, hyperlipidemia, diabetes who presents to the ED complaining of chest pain. Pharmacy has been consulted for initiation and management of heparin infusion for ACS.  Baseline labs ordered and reviewed  Heparin Dosing Weight: 114.4 kg  Goal of Therapy:  Heparin level 0.3-0.7 units/ml Monitor platelets by anticoagulation protocol: Yes   Plan:  Give 4000 units bolus x 1 Start heparin infusion at 1600 units/hr Check anti-Xa level in 6 hours and daily while on  heparin Continue to monitor H&H and platelets    0302 2207 HL 0.51, therapeutic x 1.  Will continue Heparin at current rate and recheck HL in am to confirm.  0303 0522 HL < 0.10, subtherapeutic. No problems noted with infusion per RN. Will rebolus with 4000 units x 1 and increase heparin drip to 1800 units/hr, recheck HL in 6 hrs.  [id number], PharmD Clinical Pharmacist  11/19/2020,6:55 AM

## 2020-11-19 NOTE — Progress Notes (Signed)
PROGRESS NOTE    James [name]  DXI:[id number] DOB: [birth year] DOA: 11/18/2020 PCP: [name], MD  Outpatient Specialists: pulmonology    Brief Narrative:   Hx obesity, t2dm, htn, osa, first degree family history CAD, here with chest pain, found to have nstemi.   Assessment & Plan:   Principal Problem:   NSTEMI (non-ST elevated myocardial infarction) (HCC) Active Problems:   Dyslipidemia associated with type 2 diabetes mellitus (HCC)   NAFLD (nonalcoholic fatty liver disease)   HTN (hypertension)   Type 2 diabetes, uncontrolled, with neuropathy (HCC)   Chest pain   # NSTEMI Currently chest pain free. troponins elevated to 410 yesterday. Dimer wnl. CXR clear. - continue to trend troponins - continue heparin - cath planned today, will f/u results - continue atorvastatin, metoprolol - start aspirin and lisinopril - continue telemetry - f/u echocardiogram - f/u TSH  # T2DM A1c 7.2 05/2020. - hold home glimepiride and metformin - cont SSI - plan to start sglt2i if ASCVD encountered on cath  # Elevated LFTs # NAFLD Mild transaminase elevation. t bili 1.6. Asymptmatic - monitor  # MDD - cont home celexa  # History tachycardia - metoprolol started, home atenolol held  # OSA - not on cpap at home, advised patient to start this for heart health   DVT prophylaxis: therapeutic heparin Code Status: full Family Communication: none @ bedside  Level of care: Progressive Cardiac Status is: Inpatient  Remains inpatient appropriate because:Inpatient level of care appropriate due to severity of illness   Dispo: The patient is from: Home              Anticipated d/c is to: Home              Patient currently is not medically stable to d/c.   Difficult to place patient No        Consultants:  cardiology  Procedures: Cath pending  Antimicrobials:  none    Subjective: This morning feeling well. Chest pain resolved. No sob or cough, no  fevers, no abdominal pain.  Objective: Vitals:   11/18/20 2202 11/18/20 2205 11/19/20 0245 11/19/20 0630  BP: 121/79 121/79 113/84 117/81  Pulse: 72 71 82 81  Resp:  20 20 20   Temp:      TempSrc:      SpO2:  94% 95% 95%  Weight:      Height:        Intake/Output Summary (Last 24 hours) at 11/19/2020 0731 Last data filed at 11/19/2020 0644 Gross per 24 hour  Intake -  Output 580 ml  Net -580 ml   Filed Weights   11/18/20 1416  Weight: 129.3 kg    Examination:  General exam: Appears calm and comfortable  Respiratory system: Clear to auscultation. Respiratory effort normal. Cardiovascular system: S1 & S2 heard, RRR. No JVD, murmurs, rubs, gallops or clicks. No pedal edema. Gastrointestinal system: Abdomen is nondistended, soft and nontender. No organomegaly or masses felt. Normal bowel sounds heard. Central nervous system: Alert and oriented. No focal neurological deficits. Extremities: Symmetric 5 x 5 power. Skin: No rashes, lesions or ulcers Psychiatry: Judgement and insight appear normal. Mood & affect appropriate.     Data Reviewed: I have personally reviewed following labs and imaging studies  CBC: Recent Labs  Lab 11/18/20 1411 11/19/20 0408  WBC 8.0 9.4  HGB 15.5 15.4  HCT 44.7 44.2  MCV 84.0 84.4  PLT 188 177   Basic Metabolic Panel: Recent Labs  Lab 11/18/20 1411  11/18/20 1637 11/19/20 0522  NA 133*  --  135  K 4.2  --  4.7  CL 100  --  101  CO2 23  --  24  GLUCOSE 180*  --  198*  BUN 9  --  12  CREATININE 0.80  --  0.82  CALCIUM 9.7  --  9.3  MG  --  2.2 1.9   GFR: Estimated Creatinine Clearance: 175.8 mL/min (by C-G formula based on SCr of 0.82 mg/dL). Liver Function Tests: Recent Labs  Lab 11/19/20 0522  AST 52*  ALT 48*  ALKPHOS 55  BILITOT 1.6*  PROT 7.9  ALBUMIN 4.5   Recent Labs  Lab 11/18/20 1411  LIPASE 36   No results for input(s): AMMONIA in the last 168 hours. Coagulation Profile: Recent Labs  Lab 11/18/20 1606  11/19/20 0522  INR 1.0 1.1   Cardiac Enzymes: No results for input(s): CKTOTAL, CKMB, CKMBINDEX, TROPONINI in the last 168 hours. BNP (last 3 results) No results for input(s): PROBNP in the last 8760 hours. HbA1C: No results for input(s): HGBA1C in the last 72 hours. CBG: Recent Labs  Lab 11/18/20 1710 11/18/20 2205  GLUCAP 117* 144*   Lipid Profile: No results for input(s): CHOL, HDL, LDLCALC, TRIG, CHOLHDL, LDLDIRECT in the last 72 hours. Thyroid Function Tests: No results for input(s): TSH, T4TOTAL, FREET4, T3FREE, THYROIDAB in the last 72 hours. Anemia Panel: No results for input(s): VITAMINB12, FOLATE, FERRITIN, TIBC, IRON, RETICCTPCT in the last 72 hours. Urine analysis:    Component Value Date/Time   COLORURINE YELLOW 01/11/2013 1131   APPEARANCEUR CLEAR 01/11/2013 1131   LABSPEC >=1.030 01/11/2013 1131   PHURINE 6.0 01/11/2013 1131   GLUCOSEU NEGATIVE 01/11/2013 1131   HGBUR NEGATIVE 01/11/2013 1131   BILIRUBINUR NEGATIVE 01/11/2013 1131   KETONESUR NEGATIVE 01/11/2013 1131   UROBILINOGEN 0.2 01/11/2013 1131   NITRITE NEGATIVE 01/11/2013 1131   LEUKOCYTESUR NEGATIVE 01/11/2013 1131   Sepsis Labs: @LABRCNTIP (procalcitonin:4,lacticidven:4)  ) Recent Results (from the past 240 hour(s))  Resp Panel by RT-PCR (Flu A&B, Covid) Nasopharyngeal Swab     Status: None   Collection Time: 11/18/20  4:53 PM   Specimen: Nasopharyngeal Swab; Nasopharyngeal(NP) swabs in vial transport medium  Result Value Ref Range Status   SARS Coronavirus 2 by RT PCR NEGATIVE NEGATIVE Final    Comment: (NOTE) SARS-CoV-2 target nucleic acids are NOT DETECTED.  The SARS-CoV-2 RNA is generally detectable in upper respiratory specimens during the acute phase of infection. The lowest concentration of SARS-CoV-2 viral copies this assay can detect is 138 copies/mL. A negative result does not preclude SARS-Cov-2 infection and should not be used as the sole basis for treatment or other patient  management decisions. A negative result may occur with  improper specimen collection/handling, submission of specimen other than nasopharyngeal swab, presence of viral mutation(s) within the areas targeted by this assay, and inadequate number of viral copies(<138 copies/mL). A negative result must be combined with clinical observations, patient history, and epidemiological information. The expected result is Negative.  Fact Sheet for Patients:  01/18/21  Fact Sheet for Healthcare Providers:  BloggerCourse.com  This test is no t yet approved or cleared by the [employer] FDA and  has been authorized for detection and/or diagnosis of SARS-CoV-2 by FDA under an Emergency Use Authorization (EUA). This EUA will remain  in effect (meaning this test can be used) for the duration of the COVID-19 declaration under Section 564(b)(1) of the Act, 21 U.S.C.section 360bbb-3(b)(1), unless the authorization  is terminated  or revoked sooner.       Influenza A by PCR NEGATIVE NEGATIVE Final   Influenza B by PCR NEGATIVE NEGATIVE Final    Comment: (NOTE) The Xpert Xpress SARS-CoV-2/FLU/RSV plus assay is intended as an aid in the diagnosis of influenza from Nasopharyngeal swab specimens and should not be used as a sole basis for treatment. Nasal washings and aspirates are unacceptable for Xpert Xpress SARS-CoV-2/FLU/RSV testing.  Fact Sheet for Patients: BloggerCourse.com  Fact Sheet for Healthcare Providers: SeriousBroker.it  This test is not yet approved or cleared by the [place] FDA and has been authorized for detection and/or diagnosis of SARS-CoV-2 by FDA under an Emergency Use Authorization (EUA). This EUA will remain in effect (meaning this test can be used) for the duration of the COVID-19 declaration under Section 564(b)(1) of the Act, 21 U.S.C. section 360bbb-3(b)(1),  unless the authorization is terminated or revoked.  Performed at [facility], [address] [place], [place] [address]          Radiology Studies: DG Chest 2 View  Result Date: 11/18/2020 CLINICAL DATA:  Chest pain EXAM: CHEST - 2 VIEW COMPARISON:  10/08/2014 FINDINGS: Heart and mediastinal contours are within normal limits. No focal opacities or effusions. No acute bony abnormality. IMPRESSION: No active cardiopulmonary disease. Electronically Signed   By: Charlett Nose M.D.   On: 11/18/2020 15:03        Scheduled Meds: . atorvastatin  40 mg Oral QHS  . citalopram  20 mg Oral Daily  . insulin aspart  0-9 Units Subcutaneous TID WC  . metoprolol tartrate  12.5 mg Oral BID  . sodium chloride flush  3 mL Intravenous Q12H   Continuous Infusions: . heparin 1,800 Units/hr (11/19/20 0703)     LOS: 1 day    Time spent: 40 min    Silvano Bilis, MD [facility]   If 7PM-7AM, please contact night-coverage [email or web address] Password [facility] 11/19/2020, 7:31 AM

## 2020-11-19 NOTE — Progress Notes (Signed)
*  PRELIMINARY RESULTS* Echocardiogram 2D Echocardiogram has been performed.  James [name] 11/19/2020, 9:56 AM

## 2020-11-19 NOTE — Consult Note (Signed)
ANTICOAGULATION CONSULT NOTE   Pharmacy Consult for Heparin infusion Indication: chest pain/ACS  Patient Measurements: Heparin Dosing Weight: 114.4 kg  Labs: Recent Labs    11/18/20 1411 11/18/20 1606 11/18/20 2207 11/19/20 0408 11/19/20 0522 11/19/20 1259  HGB 15.5  --   --  15.4  --   --   HCT 44.7  --   --  44.2  --   --   PLT 188  --   --  177  --   --   APTT  --  27  --   --   --   --   LABPROT  --  13.1  --   --  13.5  --   INR  --  1.0  --   --  1.1  --   HEPARINUNFRC  --   --  0.51  --  <0.10* 0.19*  CREATININE 0.80  --   --   --  0.82  --   TROPONINIHS 161* 410*  --   --  1,445*  --     Estimated Creatinine Clearance: 175.8 mL/min (by C-G formula based on SCr of 0.82 mg/dL).   Medical History: Past Medical History:  Diagnosis Date  . Diabetes type 2, controlled (HCC)   . History of asthma childhood   dust mites  . History of pneumonia 09/2014   LLL  . HTN (hypertension)   . Major depressive disorder, recurrent episode, moderate (HCC) 12/26/2015  . Mixed hyperlipidemia   . NAFLD (nonalcoholic fatty liver disease)   . Obesity   . Suicidal thoughts 12/25/2015   s/p ER evaluation    Medications:  No prior anticoagulation noted in chart review or PTA med list review   Assessment: 40 y.o. male with past medical history of hypertension, hyperlipidemia, diabetes who presents to the ED complaining of chest pain. Pharmacy has been consulted for initiation and management of heparin infusion for ACS.   Baseline labs ordered and reviewed  Goal of Therapy:  Heparin level 0.3-0.7 units/ml Monitor platelets by anticoagulation protocol: Yes   Plan:  --3/3 at 1259 HL = 0.19, subtherapeutic. Will order IV heparin 3000 unit bolus x 1 and increase infusion rate to 2100 units/hr (~18 units/kg) --Re-check HL 6 hours after rate change --Daily CBC per protocol while on heparin infusion --Patient is pending cardiac catheterization per cardiology  Tressie Ellis 11/19/2020,1:29 PM

## 2020-11-19 NOTE — Progress Notes (Signed)
Pt gone to cardiac cath.

## 2020-11-19 NOTE — ED Notes (Signed)
Pt lying in bed awake resting quietly - denies any needs at this time.  Hep drip continues to infuse @1600units /hr (5ml/hr) - continuous cardiac and pulse ox monitoring maintained.

## 2020-11-19 NOTE — Progress Notes (Signed)
[facility] Encounter Note  Patient: James [name] / Admit Date: 11/18/2020 / Date of Encounter: 11/19/2020, 5:00 PM   Subjective: Patient overall has done well overnight with no evidence of chest discomfort.  Troponin did elevate to 1440.  This is consistent with a non-ST elevation myocardial infarction.  Echocardiogram showing normal LV systolic function with ejection fraction 55% and no evidence of significant valvular heart disease.  Cardiac catheterization with normal LV systolic function ejection fraction of 50% Significant 90% obtuse marginal 2 stenosis consistent with culprit artery for myocardial infarction Moderate atherosclerosis of distal right coronary artery of 65%  Review of Systems: Positive for: None Negative for: Vision change, hearing change, syncope, dizziness, nausea, vomiting,diarrhea, bloody stool, stomach pain, cough, congestion, diaphoresis, urinary frequency, urinary pain,skin lesions, skin rashes Others previously listed  Objective: Telemetry: Normal sinus rhythm Physical Exam: Blood pressure (!) 142/100, pulse 95, temperature 98.5 F (36.9 C), resp. rate 19, height 6\' 4"  (1.93 m), weight 129.3 kg, SpO2 94 %. Body mass index is 34.69 kg/m. General: Well developed, well nourished, in no acute distress. Head: Normocephalic, atraumatic, sclera non-icteric, no xanthomas, nares are without discharge. Neck: No apparent masses Lungs: Normal respirations with no wheezes, no rhonchi, no rales , no crackles   Heart: Regular rate and rhythm, normal S1 S2, no murmur, no rub, no gallop, PMI is normal size and placement, carotid upstroke normal without bruit, jugular venous pressure normal Abdomen: Soft, non-tender, non-distended with normoactive bowel sounds. No hepatosplenomegaly. Abdominal aorta is normal size without bruit Extremities: No edema, no clubbing, no cyanosis, no ulcers,  Peripheral: 2+ radial, 2+ femoral, 2+ dorsal pedal pulses Neuro:  Alert and oriented. Moves all extremities spontaneously. Psych:  Responds to questions appropriately with a normal affect.   Intake/Output Summary (Last 24 hours) at 11/19/2020 1700 Last data filed at 11/19/2020 0644 Gross per 24 hour  Intake --  Output 580 ml  Net -580 ml    Inpatient Medications:  . [MAR Hold] aspirin EC  81 mg Oral Daily  . [MAR Hold] atorvastatin  80 mg Oral QHS  . [MAR Hold] citalopram  20 mg Oral Daily  . [MAR Hold] insulin aspart  0-9 Units Subcutaneous TID WC  . [MAR Hold] lisinopril  5 mg Oral Daily  . [MAR Hold] metoprolol tartrate  12.5 mg Oral BID  . [MAR Hold] sodium chloride flush  3 mL Intravenous Q12H   Infusions:  . sodium chloride    . [START ON 11/20/2020] sodium chloride     Followed by  . [START ON 11/20/2020] sodium chloride    . heparin Stopped (11/19/20 1530)    Labs: Recent Labs    11/18/20 1411 11/18/20 1637 11/19/20 0522  NA 133*  --  135  K 4.2  --  4.7  CL 100  --  101  CO2 23  --  24  GLUCOSE 180*  --  198*  BUN 9  --  12  CREATININE 0.80  --  0.82  CALCIUM 9.7  --  9.3  MG  --  2.2 1.9   Recent Labs    11/19/20 0522  AST 52*  ALT 48*  ALKPHOS 55  BILITOT 1.6*  PROT 7.9  ALBUMIN 4.5   Recent Labs    11/18/20 1411 11/19/20 0408  WBC 8.0 9.4  HGB 15.5 15.4  HCT 44.7 44.2  MCV 84.0 84.4  PLT 188 177   No results for input(s): CKTOTAL, CKMB, TROPONINI in the last 72 hours.  Invalid input(s): POCBNP No results for input(s): HGBA1C in the last 72 hours.   Weights: Filed Weights   11/18/20 1416 11/19/20 1504  Weight: 129.3 kg 129.3 kg     Radiology/Studies:  DG Chest 2 View  Result Date: 11/18/2020 CLINICAL DATA:  Chest pain EXAM: CHEST - 2 VIEW COMPARISON:  10/08/2014 FINDINGS: Heart and mediastinal contours are within normal limits. No focal opacities or effusions. No acute bony abnormality. IMPRESSION: No active cardiopulmonary disease. Electronically Signed   By: Charlett Nose M.D.   On: 11/18/2020 15:03    ECHOCARDIOGRAM COMPLETE  Result Date: 11/19/2020    ECHOCARDIOGRAM REPORT   Patient Name:   James [name] Date of Exam: 11/19/2020 Medical Rec #:  [id number]         Height:       76.0 in Accession #:    [id number]        Weight:       285.0 lb Date of Birth:  Jan 31, 1981        BSA:          2.575 m Patient Age:    39 years          BP:           140/125 mmHg Patient Gender: M                 HR:           93 bpm. Exam Location:  [facility] Procedure: 2D Echo, Color Doppler, Cardiac Doppler and Intracardiac            Opacification Agent Indications:     R07.9 Chest Pain  History:         Patient has no prior history of Echocardiogram examinations.                  Risk Factors:Hypertension, Diabetes and Dyslipidemia.  Sonographer:     Humphrey Rolls RDCS (AE) Referring Phys:  [id number] Angie Fava Diagnosing Phys: Marcina Millard MD  Sonographer Comments: Technically difficult study due to poor echo windows. Image acquisition challenging due to patient body habitus. IMPRESSIONS  1. Left ventricular ejection fraction, by estimation, is 55 to 60%. The left ventricle has normal function. The left ventricle has no regional wall motion abnormalities. Left ventricular diastolic parameters were normal.  2. Right ventricular systolic function is normal. The right ventricular size is normal.  3. The mitral valve is normal in structure. Trivial mitral valve regurgitation. No evidence of mitral stenosis.  4. The aortic valve is normal in structure. Aortic valve regurgitation is not visualized. No aortic stenosis is present.  5. The inferior vena cava is normal in size with greater than 50% respiratory variability, suggesting right atrial pressure of 3 mmHg. FINDINGS  Left Ventricle: Left ventricular ejection fraction, by estimation, is 55 to 60%. The left ventricle has normal function. The left ventricle has no regional wall motion abnormalities. Definity contrast agent was given IV to delineate the left ventricular   endocardial borders. The left ventricular internal cavity size was normal in size. There is no left ventricular hypertrophy. Left ventricular diastolic parameters were normal. Right Ventricle: The right ventricular size is normal. No increase in right ventricular wall thickness. Right ventricular systolic function is normal. Left Atrium: Left atrial size was normal in size. Right Atrium: Right atrial size was normal in size. Pericardium: There is no evidence of pericardial effusion. Mitral Valve: The mitral valve is normal in structure. Trivial mitral valve regurgitation. No  evidence of mitral valve stenosis. MV peak gradient, 1.7 mmHg. The mean mitral valve gradient is 1.0 mmHg. Tricuspid Valve: The tricuspid valve is normal in structure. Tricuspid valve regurgitation is trivial. No evidence of tricuspid stenosis. Aortic Valve: The aortic valve is normal in structure. Aortic valve regurgitation is not visualized. No aortic stenosis is present. Aortic valve mean gradient measures 3.0 mmHg. Aortic valve peak gradient measures 6.0 mmHg. Aortic valve area, by VTI measures 3.49 cm. Pulmonic Valve: The pulmonic valve was normal in structure. Pulmonic valve regurgitation is not visualized. No evidence of pulmonic stenosis. Aorta: The aortic root is normal in size and structure. Venous: The inferior vena cava is normal in size with greater than 50% respiratory variability, suggesting right atrial pressure of 3 mmHg. IAS/Shunts: No atrial level shunt detected by color flow Doppler.  LEFT VENTRICLE PLAX 2D LVOT diam:     2.50 cm      Diastology LV SV:         64           LV e' medial:    6.64 cm/s LV SV Index:   25           LV E/e' medial:  9.2 LVOT Area:     4.91 cm     LV e' lateral:   16.80 cm/s                             LV E/e' lateral: 3.6  LV Volumes (MOD) LV vol d, MOD A2C: 108.0 ml LV vol d, MOD A4C: 119.0 ml LV vol s, MOD A2C: 49.2 ml LV vol s, MOD A4C: 48.5 ml LV SV MOD A2C:     58.8 ml LV SV MOD A4C:      119.0 ml LV SV MOD BP:      66.1 ml RIGHT VENTRICLE RV Basal diam:  2.10 cm LEFT ATRIUM             Index      RIGHT ATRIUM           Index LA Vol (A2C):   23.5 ml 9.13 ml/m RA Area:     11.10 cm LA Vol (A4C):   24.6 ml 9.55 ml/m RA Volume:   26.30 ml  10.21 ml/m LA Biplane Vol: 24.7 ml 9.59 ml/m  AORTIC VALVE                   PULMONIC VALVE AV Area (Vmax):    3.54 cm    PV Vmax:       1.09 m/s AV Area (Vmean):   3.30 cm    PV Vmean:      83.300 cm/s AV Area (VTI):     3.49 cm    PV VTI:        0.189 m AV Vmax:           122.00 cm/s PV Peak grad:  4.8 mmHg AV Vmean:          85.100 cm/s PV Mean grad:  3.0 mmHg AV VTI:            0.184 m AV Peak Grad:      6.0 mmHg AV Mean Grad:      3.0 mmHg LVOT Vmax:         88.10 cm/s LVOT Vmean:        57.200 cm/s LVOT VTI:  0.131 m LVOT/AV VTI ratio: 0.71  AORTA Ao Root diam: 3.60 cm MITRAL VALVE MV Area (PHT): 7.16 cm    SHUNTS MV Area VTI:   4.80 cm    Systemic VTI:  0.13 m MV Peak grad:  1.7 mmHg    Systemic Diam: 2.50 cm MV Mean grad:  1.0 mmHg MV Vmax:       0.66 m/s MV Vmean:      46.1 cm/s MV Decel Time: 106 msec MV E velocity: 61.30 cm/s MV A velocity: 63.80 cm/s MV E/A ratio:  0.96 Marcina Millard MD Electronically signed by Marcina Millard MD Signature Date/Time: 11/19/2020/2:24:57 PM    Final      Assessment and Recommendation  40 y.o. male with known diabetes hypertension hyperlipidemia with acute non-ST elevation myocardial infarction with culprit stenosis of obtuse marginal artery 1.  PCI and stent placement of obtuse marginal 2 artery due to non-ST elevation myocardial infarction 2.  Dual antiplatelet therapy with aspirin and Brilinta 3.  High intensity cholesterol therapy will be continued with atorvastatin at 80 mg 4.  Beta-blocker ACE inhibitor for hypertension control risk factor modification and myocardial infarction 5.  Cardiac rehabilitation 6.  Begin ambulation tomorrow morning for further evaluation and improvements of  symptoms and okay for discharged home if ambulating well with follow-up next week  Signed, Arnoldo Hooker M.D. FACC

## 2020-11-19 NOTE — Progress Notes (Signed)
Patient admitted to SDU post-PCI for angiomax infusion. Plan to back transfer to PCU once off drip @ 2200 hrs. Patient denies CP or SOB. Interventional site: no complications.

## 2020-11-19 NOTE — Consult Note (Signed)
ANTICOAGULATION CONSULT NOTE   Pharmacy Consult for Heparin infusion Indication: chest pain/ACS  No Known Allergies  Patient Measurements: Height: 6\' 4"  (193 cm) Weight: 129.3 kg (285 lb) IBW/kg (Calculated) : 86.8 Heparin Dosing Weight: 114.4 kg  Vital Signs: Temp: 98.6 F (37 C) (03/02 1410) Temp Source: Oral (03/02 1410) BP: 121/79 (03/02 2205) Pulse Rate: 71 (03/02 2205)  Labs: Recent Labs    11/18/20 1226 11/18/20 1411 11/18/20 1606 11/18/20 2207  HGB  --  15.5  --   --   HCT  --  44.7  --   --   PLT  --  188  --   --   APTT  --   --  27  --   LABPROT  --   --  13.1  --   INR  --   --  1.0  --   HEPARINUNFRC  --   --   --  0.51  CREATININE  --  0.80  --   --   TROPONINIHS 86* 161* 410*  --     Estimated Creatinine Clearance: 180.2 mL/min (by C-G formula based on SCr of 0.8 mg/dL).   Medical History: Past Medical History:  Diagnosis Date  . Diabetes type 2, controlled (HCC)   . History of asthma childhood   dust mites  . History of pneumonia 09/2014   LLL  . HTN (hypertension)   . Major depressive disorder, recurrent episode, moderate (HCC) 12/26/2015  . Mixed hyperlipidemia   . NAFLD (nonalcoholic fatty liver disease)   . Obesity   . Suicidal thoughts 12/25/2015   s/p ER evaluation    Medications:  No prior anticoagulation noted in chart review or PTA med list review   Assessment: 40 y.o. male with past medical history of hypertension, hyperlipidemia, diabetes who presents to the ED complaining of chest pain. Pharmacy has been consulted for initiation and management of heparin infusion for ACS.  Baseline labs ordered and reviewed  Heparin Dosing Weight: 114.4 kg  Goal of Therapy:  Heparin level 0.3-0.7 units/ml Monitor platelets by anticoagulation protocol: Yes   Plan:  Give 4000 units bolus x 1 Start heparin infusion at 1600 units/hr Check anti-Xa level in 6 hours and daily while on heparin Continue to monitor H&H and platelets    0302  2207 HL 0.51, therapeutic x 1.  Will continue Heparin at current rate and recheck HL in am to confirm.  [id number], PharmD Clinical Pharmacist  11/19/2020,12:39 AM

## 2020-11-19 NOTE — Plan of Care (Signed)

## 2020-11-19 NOTE — ED Notes (Signed)
Pt lying awake in bed calm and quiet- pleasant affect.  No pain verbalized at this time.  Cardiac monitor.

## 2020-11-20 ENCOUNTER — Telehealth: Payer: Self-pay

## 2020-11-20 ENCOUNTER — Encounter: Payer: Self-pay | Admitting: Internal Medicine

## 2020-11-20 LAB — POCT ACTIVATED CLOTTING TIME
Activated Clotting Time: 219 seconds
Activated Clotting Time: 440 seconds

## 2020-11-20 LAB — CBC
HCT: 42.6 % (ref 39.0–52.0)
Hemoglobin: 14.4 g/dL (ref 13.0–17.0)
MCH: 28.7 pg (ref 26.0–34.0)
MCHC: 33.8 g/dL (ref 30.0–36.0)
MCV: 85 fL (ref 80.0–100.0)
Platelets: 158 10*3/uL (ref 150–400)
RBC: 5.01 MIL/uL (ref 4.22–5.81)
RDW: 12.6 % (ref 11.5–15.5)
WBC: 6.9 10*3/uL (ref 4.0–10.5)
nRBC: 0 % (ref 0.0–0.2)

## 2020-11-20 LAB — GLUCOSE, CAPILLARY: Glucose-Capillary: 302 mg/dL — ABNORMAL HIGH (ref 70–99)

## 2020-11-20 LAB — COMPREHENSIVE METABOLIC PANEL
ALT: 41 U/L (ref 0–44)
AST: 34 U/L (ref 15–41)
Albumin: 4.1 g/dL (ref 3.5–5.0)
Alkaline Phosphatase: 60 U/L (ref 38–126)
Anion gap: 10 (ref 5–15)
BUN: 11 mg/dL (ref 6–20)
CO2: 22 mmol/L (ref 22–32)
Calcium: 8.8 mg/dL — ABNORMAL LOW (ref 8.9–10.3)
Chloride: 102 mmol/L (ref 98–111)
Creatinine, Ser: 0.92 mg/dL (ref 0.61–1.24)
GFR, Estimated: >60 mL/min (ref >60)
Glucose, Bld: 309 mg/dL — ABNORMAL HIGH (ref 70–99)
Potassium: 4.2 mmol/L (ref 3.5–5.1)
Sodium: 134 mmol/L — ABNORMAL LOW (ref 135–145)
Total Bilirubin: 1 mg/dL (ref 0.3–1.2)
Total Protein: 7.3 g/dL (ref 6.5–8.1)

## 2020-11-20 MED ORDER — GLIMEPIRIDE 2 MG PO TABS: 2.0000 mg | ORAL_TABLET | Freq: Every day | ORAL | 1 refills | Status: DC

## 2020-11-20 MED ORDER — TICAGRELOR 90 MG PO TABS: 90.0000 mg | ORAL_TABLET | Freq: Two times a day (BID) | ORAL | 1 refills | Status: DC

## 2020-11-20 MED ORDER — ASPIRIN 81 MG PO TBEC: 81.0000 mg | DELAYED_RELEASE_TABLET | Freq: Every day | ORAL | 11 refills | Status: AC

## 2020-11-20 MED ORDER — METOPROLOL SUCCINATE ER 25 MG PO TB24: 25.0000 mg | ORAL_TABLET | Freq: Every day | ORAL | 11 refills | Status: DC

## 2020-11-20 MED ORDER — ATORVASTATIN CALCIUM 80 MG PO TABS: 80.0000 mg | ORAL_TABLET | Freq: Every day | ORAL | 1 refills | Status: DC

## 2020-11-20 MED ORDER — CANAGLIFLOZIN 100 MG PO TABS: 100.0000 mg | ORAL_TABLET | Freq: Every day | ORAL | 1 refills | Status: DC

## 2020-11-20 MED ORDER — LISINOPRIL 5 MG PO TABS: 5.0000 mg | ORAL_TABLET | Freq: Every day | ORAL | 1 refills | Status: DC

## 2020-11-20 NOTE — Telephone Encounter (Signed)
Transition Care Management Follow-up Telephone Call  Date of discharge and from where: 11/20/2020, [facility]  How have you been since you were released from the hospital? Patient states that he is doing just fine.   Any questions or concerns? No  Items Reviewed:  Did the pt receive and understand the discharge instructions provided? Yes   Medications obtained and verified? Yes   Other? No   Any new allergies since your discharge? No   Dietary orders reviewed? Yes  Do you have support at home? Yes   Home Care and Equipment/Supplies: Were home health services ordered? not applicable If so, what is the name of the agency? N/A  Has the agency set up a time to come to the patient's home? not applicable Were any new equipment or medical supplies ordered?  No What is the name of the medical supply agency? N/A Were you able to get the supplies/equipment? not applicable Do you have any questions related to the use of the equipment or supplies? No  Functional Questionnaire: (I = Independent and D = Dependent) ADLs: I  Bathing/Dressing- I  Meal Prep- I  Eating- I  Maintaining continence- I  Transferring/Ambulation- I  Managing Meds- I  Follow up appointments reviewed:   PCP Hospital f/u appt confirmed? Yes  Scheduled to see Dr. Sharen Hones on 11/30/2020 @ 10 am.  Specialist Hospital f/u appt confirmed? follow up with cardiology   Are transportation arrangements needed? No   If their condition worsens, is the pt aware to call PCP or go to the Emergency Dept.? Yes  Was the patient provided with contact information for the PCP's office or ED? Yes  Was to pt encouraged to call back with questions or concerns? Yes

## 2020-11-20 NOTE — Progress Notes (Signed)
[facility]  SUBJECTIVE: Patient laying in bed, denies chest pain   Vitals:   11/20/20 0100 11/20/20 0200 11/20/20 0240 11/20/20 0734  BP: 123/81 129/84 139/79 120/80  Pulse: 92 90 88 85  Resp: 19 (!) 22 18 18   Temp:   98.6 F (37 C) 98.4 F (36.9 C)  TempSrc:   Oral   SpO2: 97% 96% 96% 99%  Weight:      Height:         Intake/Output Summary (Last 24 hours) at 11/20/2020 0815 Last data filed at 11/20/2020 01/20/2021 Gross per 24 hour  Intake 710.49 ml  Output -  Net 710.49 ml      PHYSICAL EXAM  General: Well developed, well nourished, in no acute distress HEENT:  Normocephalic and atramatic Neck:  No JVD.  Lungs: Clear bilaterally to auscultation and percussion. Heart: HRRR . Normal S1 and S2 without gallops or murmurs.  Abdomen: Bowel sounds are positive, abdomen soft and non-tender  Msk:  Back normal, normal gait. Normal strength and tone for age. Extremities: No clubbing, cyanosis or edema.   Neuro: Alert and oriented X 3. Psych:  Good affect, responds appropriately   LABS: Basic Metabolic Panel: Recent Labs    11/18/20 1637 11/19/20 0522 11/20/20 0723  NA  --  135 134*  K  --  4.7 4.2  CL  --  101 102  CO2  --  24 22  GLUCOSE  --  198* 309*  BUN  --  12 11  CREATININE  --  0.82 0.92  CALCIUM  --  9.3 8.8*  MG 2.2 1.9  --    Liver Function Tests: Recent Labs    11/19/20 0522 11/20/20 0723  AST 52* 34  ALT 48* 41  ALKPHOS 55 60  BILITOT 1.6* 1.0  PROT 7.9 7.3  ALBUMIN 4.5 4.1   Recent Labs    11/18/20 1411  LIPASE 36   CBC: Recent Labs    11/18/20 1411 11/19/20 0408  WBC 8.0 9.4  HGB 15.5 15.4  HCT 44.7 44.2  MCV 84.0 84.4  PLT 188 177   Cardiac Enzymes: No results for input(s): CKTOTAL, CKMB, CKMBINDEX, TROPONINI in the last 72 hours. BNP: Invalid input(s): POCBNP D-Dimer: Recent Labs    11/18/20 1637  DDIMER 0.32   Hemoglobin A1C: No results for input(s): HGBA1C in the last 72 hours. Fasting Lipid Panel: No results for  input(s): CHOL, HDL, LDLCALC, TRIG, CHOLHDL, LDLDIRECT in the last 72 hours. Thyroid Function Tests: Recent Labs    11/19/20 0522  TSH 2.021   Anemia Panel: No results for input(s): VITAMINB12, FOLATE, FERRITIN, TIBC, IRON, RETICCTPCT in the last 72 hours.  DG Chest 2 View  Result Date: 11/18/2020 CLINICAL DATA:  Chest pain EXAM: CHEST - 2 VIEW COMPARISON:  10/08/2014 FINDINGS: Heart and mediastinal contours are within normal limits. No focal opacities or effusions. No acute bony abnormality. IMPRESSION: No active cardiopulmonary disease. Electronically Signed   By: 10/10/2014 M.D.   On: 11/18/2020 15:03   CARDIAC CATHETERIZATION  Result Date: 11/19/2020  Dist RCA lesion is 65% stenosed.  2nd Mrg lesion is 85% stenosed.  Prox LAD lesion is 20% stenosed.  Prox RCA lesion is 30% stenosed.  40 year old male with hypertension hyperlipidemia diabetes and significant family history of cardiovascular disease having non-ST elevation myocardial infarction Left ventricle with normal LV systolic function with ejection fraction of 50% Significant 85% long stenosis of obtuse marginal 2 Noncritical stenosis of distal right coronary artery  of 65% Plan PCI and stent placement of culprit lesion obtuse marginal 2 Medical management for right coronary artery stenosis High intensity cholesterol therapy Beta-blocker and ACE inhibitor for hypertension control and myocardial infarction Dual antiplatelet therapy Cardiac rehabilitation   CARDIAC CATHETERIZATION  Result Date: 11/19/2020  2nd Mrg lesion is 95% stenosed.  A drug-eluting stent was successfully placed using a STENT SYNERGY DES 2.25X20.  Post intervention, there is a 0% residual stenosis.  1.  Non-ST elevation myocardial infarction 2.  High-grade 95% ulcerated stenosis OM 2 3.  Successful PCI with 2.25 x 20 mm Synergy drug-eluting stent OM 2 Recommendations 1.  Dual antiplatelet therapy uninterrupted for 1 year 2.  Aggressive risk factor modification 3.   Probable discharge in a.m.   ECHOCARDIOGRAM COMPLETE  Result Date: 11/19/2020    ECHOCARDIOGRAM REPORT   Patient Name:   James NoelSHANNON Simmer Date of Exam: 11/19/2020 Medical Rec #:  [id number]         Height:       76.0 in Accession #:    [id number]        Weight:       285.0 lb Date of Birth:  Aug 24, 1981        BSA:          2.575 m Patient Age:    40 years          BP:           140/125 mmHg Patient Gender: M                 HR:           93 bpm. Exam Location:  [facility] Procedure: 2D Echo, Color Doppler, Cardiac Doppler and Intracardiac            Opacification Agent Indications:     R07.9 Chest Pain  History:         Patient has no prior history of Echocardiogram examinations.                  Risk Factors:Hypertension, Diabetes and Dyslipidemia.  Sonographer:     Humphrey RollsJoan Heiss RDCS (AE) Referring Phys:  [id number] Angie FavaJUSTIN B HOWERTER Diagnosing Phys: Marcina MillardAlexander Paraschos MD  Sonographer Comments: Technically difficult study due to poor echo windows. Image acquisition challenging due to patient body habitus. IMPRESSIONS  1. Left ventricular ejection fraction, by estimation, is 55 to 60%. The left ventricle has normal function. The left ventricle has no regional wall motion abnormalities. Left ventricular diastolic parameters were normal.  2. Right ventricular systolic function is normal. The right ventricular size is normal.  3. The mitral valve is normal in structure. Trivial mitral valve regurgitation. No evidence of mitral stenosis.  4. The aortic valve is normal in structure. Aortic valve regurgitation is not visualized. No aortic stenosis is present.  5. The inferior vena cava is normal in size with greater than 50% respiratory variability, suggesting right atrial pressure of 3 mmHg. FINDINGS  Left Ventricle: Left ventricular ejection fraction, by estimation, is 55 to 60%. The left ventricle has normal function. The left ventricle has no regional wall motion abnormalities. Definity contrast agent was given IV to  delineate the left ventricular  endocardial borders. The left ventricular internal cavity size was normal in size. There is no left ventricular hypertrophy. Left ventricular diastolic parameters were normal. Right Ventricle: The right ventricular size is normal. No increase in right ventricular wall thickness. Right ventricular systolic function is normal. Left Atrium: Left atrial size was normal  in size. Right Atrium: Right atrial size was normal in size. Pericardium: There is no evidence of pericardial effusion. Mitral Valve: The mitral valve is normal in structure. Trivial mitral valve regurgitation. No evidence of mitral valve stenosis. MV peak gradient, 1.7 mmHg. The mean mitral valve gradient is 1.0 mmHg. Tricuspid Valve: The tricuspid valve is normal in structure. Tricuspid valve regurgitation is trivial. No evidence of tricuspid stenosis. Aortic Valve: The aortic valve is normal in structure. Aortic valve regurgitation is not visualized. No aortic stenosis is present. Aortic valve mean gradient measures 3.0 mmHg. Aortic valve peak gradient measures 6.0 mmHg. Aortic valve area, by VTI measures 3.49 cm. Pulmonic Valve: The pulmonic valve was normal in structure. Pulmonic valve regurgitation is not visualized. No evidence of pulmonic stenosis. Aorta: The aortic root is normal in size and structure. Venous: The inferior vena cava is normal in size with greater than 50% respiratory variability, suggesting right atrial pressure of 3 mmHg. IAS/Shunts: No atrial level shunt detected by color flow Doppler.  LEFT VENTRICLE PLAX 2D LVOT diam:     2.50 cm      Diastology LV SV:         64           LV e' medial:    6.64 cm/s LV SV Index:   25           LV E/e' medial:  9.2 LVOT Area:     4.91 cm     LV e' lateral:   16.80 cm/s                             LV E/e' lateral: 3.6  LV Volumes (MOD) LV vol d, MOD A2C: 108.0 ml LV vol d, MOD A4C: 119.0 ml LV vol s, MOD A2C: 49.2 ml LV vol s, MOD A4C: 48.5 ml LV SV MOD A2C:      58.8 ml LV SV MOD A4C:     119.0 ml LV SV MOD BP:      66.1 ml RIGHT VENTRICLE RV Basal diam:  2.10 cm LEFT ATRIUM             Index      RIGHT ATRIUM           Index LA Vol (A2C):   23.5 ml 9.13 ml/m RA Area:     11.10 cm LA Vol (A4C):   24.6 ml 9.55 ml/m RA Volume:   26.30 ml  10.21 ml/m LA Biplane Vol: 24.7 ml 9.59 ml/m  AORTIC VALVE                   PULMONIC VALVE AV Area (Vmax):    3.54 cm    PV Vmax:       1.09 m/s AV Area (Vmean):   3.30 cm    PV Vmean:      83.300 cm/s AV Area (VTI):     3.49 cm    PV VTI:        0.189 m AV Vmax:           122.00 cm/s PV Peak grad:  4.8 mmHg AV Vmean:          85.100 cm/s PV Mean grad:  3.0 mmHg AV VTI:            0.184 m AV Peak Grad:      6.0 mmHg AV Mean Grad:      3.0  mmHg LVOT Vmax:         88.10 cm/s LVOT Vmean:        57.200 cm/s LVOT VTI:          0.131 m LVOT/AV VTI ratio: 0.71  AORTA Ao Root diam: 3.60 cm MITRAL VALVE MV Area (PHT): 7.16 cm    SHUNTS MV Area VTI:   4.80 cm    Systemic VTI:  0.13 m MV Peak grad:  1.7 mmHg    Systemic Diam: 2.50 cm MV Mean grad:  1.0 mmHg MV Vmax:       0.66 m/s MV Vmean:      46.1 cm/s MV Decel Time: 106 msec MV E velocity: 61.30 cm/s MV A velocity: 63.80 cm/s MV E/A ratio:  0.96 Marcina Millard MD Electronically signed by Marcina Millard MD Signature Date/Time: 11/19/2020/2:24:57 PM    Final      Echo LVEF 55 to 60%  TELEMETRY: Sinus rhythm:  ASSESSMENT AND PLAN:  Principal Problem:   NSTEMI (non-ST elevated myocardial infarction) (HCC) Active Problems:   Dyslipidemia associated with type 2 diabetes mellitus (HCC)   NAFLD (nonalcoholic fatty liver disease)   HTN (hypertension)   Type 2 diabetes, uncontrolled, with neuropathy (HCC)   Chest pain    1.  Non-ST elevation myocardial infarction, high-grade stenosis OM 2, preserved left ventricular function 2.  Status post PCI with 2.25 x 20 mm Synergy drug-eluting stent OM 2, no recurrent chest pain  Recommendations  1.  Continue current  medications 2.  Continue dual antiplatelet therapy uninterrupted for 1 year 3.  Continue metoprolol tartrate and lisinopril 4.  Continue high intensity atorvastatin 5.  Ambulate, discharge patient home today 6.  Follow-up Dr. Gwen Pounds in 1 week  Marcina Millard, MD, PhD, [facility] 11/20/2020 8:15 AM

## 2020-11-20 NOTE — Discharge Summary (Signed)
James [name] JME:[id number] DOB: 1980/11/30 DOA: 11/18/2020  PCP: [name], MD  Admit date: 11/18/2020 Discharge date: 11/20/2020  Time spent: 35 minutes  Recommendations for Outpatient Follow-up:  1. Cardiology f/u 1 week 2. pcp f/u 1-2 weeks 3. Will need bmp in 1-2 weeks 4. Referred to cardiac rehab 5. Needs to start cpap for osa     Discharge Diagnoses:  Principal Problem:   NSTEMI (non-ST elevated myocardial infarction) (HCC) Active Problems:   Dyslipidemia associated with type 2 diabetes mellitus (HCC)   NAFLD (nonalcoholic fatty liver disease)   HTN (hypertension)   Type 2 diabetes, uncontrolled, with neuropathy (HCC)   Chest pain   Discharge Condition: good  Diet recommendation: heart healthy  Filed Weights   11/18/20 1416 11/19/20 1504  Weight: 129.3 kg 129.3 kg    History of present illness:  James [name] is a 40 y.o. male with medical history significant for hypertension, hyperlipidemia, type 2 diabetes mellitus, obesity, nonalcoholic fatty liver disease, depression, who is admitted to [facility] on 11/18/2020 with NSTEMi after presenting from home to [facility] ED complaining of chest pain.   The patient reports 2 episodes of nonradiating substernal chest pressure over the last day.  The first such episode woke him up from sleep at approximately 230 AM this morning.  Reports that this episode lasted for approximately 20 to 25 minutes, and improved after taking Tums before completely resolving in the absence of any nitroglycerin.  States that this episode was nonpositional, nonpleuritic, nonexertional, and not reproducible with direct palpation the anterior chest wall.  Denies any associated shortness of breath, palpitations, diaphoresis, nausea, vomiting, dizziness, presyncope, or syncope.  Patient reports that he subsequently woke back up at 6 AM in order to cure for, at which time he noted no residual chest discomfort.  Subsequently,  while sitting at work he developed a second episode of substernal chest pressure at approximately 11 AM today.  Reports this episode was similar in quality, intensity, location, and without any exacerbating factors, including nonexertional.  Similar to the episode that he had experienced early in the morning, the second episode was also self-limited, completely resolving after 20 to 30 minutes, this time without any pharmacologic intervention, including no nitroglycerin.  He reports 1 prior episode of chest discomfort similar to the above that occurred on Saturday, 11/14/2020, although with this prior episode stated to be shorter in duration, lasting 5-10 minutes, was similar to the above in terms of intensity, location, quality.  This first episode occurring also resolved spontaneously without NG.  Outside of these 3 episodes of chest discomfort, the patient denies any prior episodes of similar pain.   The patient acknowledges medical history is notable for hypertension, hyperlipidemia, and type 2 diabetes mellitus.  Denies any known underlying cardiac history, and has not previously seen a cardiologist.  Reports that he is a lifelong non-smoker denies recreational drug use.  He reports he history of coronary disease in his father, which was diagnosed at age 54, but otherwise denies any known history of CAD in his family.  Patient is not previously undergone any form of ischemic evaluation in terms of no prior stress test or echocardiogram.  Denies any personal or family history of DVT/PE or inheritable hypercoagulable state.  Denies any recent trauma, travel, surgery, extended periods of diminishing ambulatory activity, or any personal history of malignancy.  Denies any new peripheral swelling, erythema, calf tenderness, or hemoptysis.   Denies any associated subjective fever, chills, rigors, or generalized myalgias.  Denies any associated shortness of breath or cough.  Also denies any recent headaches,  neck stiffness, rhinitis, rhinorrhea, sore throat, abdominal pain, diarrhea, or rash. No recent traveling or known COVID-19 exposures. Denies dysuria, gross hematuria, or change in urinary urgency/frequency.   After resolution of the patient's most recent episode of chest pain around 1130 this morning while at work, he presented to local urgent care for further evaluation of such, at which time initial troponin was noted to be mildly elevated at 86, prompting the patient to be taken via EMS to [facility] ED for further evaluation.  He reportedly had aspirin 81 mg p.o. x4 in route to the hospital.  He currently reports no residual chest discomfort.  Hospital Course:  # NSTEMI Currently chest pain free. PCI w/ DES to OM. Normal TTE. - d/c on aspirin/brilinta, atorvastatin, metoprolol, lisinopril - f/u pcp, cardiology, cardiac rehab - will need further lifestyle modification, initiation of cpap, etc.  # T2DM A1c 7.2 05/2020. - cont home metformin, reduce glimep to 2, add invokana given new ascvd - close outpt pcp f/u  # Elevated LFTs # NAFLD Mild transaminase elevation. t bili 1.6. Asymptmatic - consider outpt ruq u/s  # MDD - cont home celexa  # History tachycardia - metoprolol started, home atenolol held  # OSA - not on cpap at home, advised patient to start this for heart health  Procedures:  Left heart cath   Consultations:  cardiology  Discharge Exam: Vitals:   11/20/20 0240 11/20/20 0734  BP: 139/79 120/80  Pulse: 88 85  Resp: 18 18  Temp: 98.6 F (37 C) 98.4 F (36.9 C)  SpO2: 96% 99%    General: well appearing Cardiovascular: rrr, no rub/murmur/gallop Respiratory: ctab Ext: warm  Discharge Instructions   Discharge Instructions    AMB Referral to Cardiac Rehabilitation - Phase II   Complete by: As directed    Diagnosis:  Coronary Stents NSTEMI     After initial evaluation and assessments completed: Virtual Based Care may be provided alone or in  conjunction with Phase 2 Cardiac Rehab based on patient barriers.: Yes   Diet - low sodium heart healthy   Complete by: As directed    Increase activity slowly   Complete by: As directed      Allergies as of 11/20/2020   No Known Allergies     Medication List    STOP taking these medications   atenolol 50 MG tablet Commonly known as: TENORMIN   lovastatin 40 MG tablet Commonly known as: MEVACOR     TAKE these medications   aspirin 81 MG EC tablet Take 1 tablet (81 mg total) by mouth daily. Swallow whole. Start taking on: November 21, 2020   atorvastatin 80 MG tablet Commonly known as: LIPITOR Take 1 tablet (80 mg total) by mouth at bedtime.   canagliflozin 100 MG Tabs tablet Commonly known as: Invokana Take 1 tablet (100 mg total) by mouth daily before breakfast.   citalopram 20 MG tablet Commonly known as: CELEXA Take 1 tablet by mouth once daily   glimepiride 2 MG tablet Commonly known as: AMARYL Take 1 tablet (2 mg total) by mouth daily with breakfast. Take 1 tablet by mouth once daily with breakfast What changed:   medication strength  how much to take  how to take this  when to take this   lisinopril 5 MG tablet Commonly known as: ZESTRIL Take 1 tablet (5 mg total) by mouth daily. Start taking on: November 21, 2020   metFORMIN 1000 MG tablet Commonly known as: GLUCOPHAGE TAKE 1 TABLET BY MOUTH TWICE DAILY WITH MEALS   metoprolol succinate 25 MG 24 hr tablet Commonly known as: Toprol XL Take 1 tablet (25 mg total) by mouth daily.   ticagrelor 90 MG Tabs tablet Commonly known as: BRILINTA Take 1 tablet (90 mg total) by mouth 2 (two) times daily.   vitamin B-12 500 MCG tablet Commonly known as: V-R VITAMIN B-12 Take 1 tablet (500 mcg total) by mouth daily.      No Known Allergies  Follow-up Information    Lamar BlinksKowalski, Bruce J, MD Follow up in 1 week(s).   Specialty: Cardiology Contact information: [address] [facility] [place] [address] [phone]        Eustaquio BoydenGutierrez, Javier, MD. Schedule an appointment as soon as possible for a visit.   Specialty: Family Medicine Contact information: [address] [place] [address] [phone]                The results of significant diagnostics from this hospitalization (including imaging, microbiology, ancillary and laboratory) are listed below for reference.    Significant Diagnostic Studies: DG Chest 2 View  Result Date: 11/18/2020 CLINICAL DATA:  Chest pain EXAM: CHEST - 2 VIEW COMPARISON:  10/08/2014 FINDINGS: Heart and mediastinal contours are within normal limits. No focal opacities or effusions. No acute bony abnormality. IMPRESSION: No active cardiopulmonary disease. Electronically Signed   By: Charlett NoseKevin  Dover M.D.   On: 11/18/2020 15:03   CARDIAC CATHETERIZATION  Result Date: 11/19/2020  Dist RCA lesion is 65% stenosed.  2nd Mrg lesion is 85% stenosed.  Prox LAD lesion is 20% stenosed.  Prox RCA lesion is 30% stenosed.  40 year old male with hypertension hyperlipidemia diabetes and significant family history of cardiovascular disease having non-ST elevation myocardial infarction Left ventricle with normal LV systolic function with ejection fraction of 50% Significant 85% long stenosis of obtuse marginal 2 Noncritical stenosis of distal right coronary artery of 65% Plan PCI and stent placement of culprit lesion obtuse marginal 2 Medical management for right coronary artery stenosis High intensity cholesterol therapy Beta-blocker and ACE inhibitor for hypertension control and myocardial infarction Dual antiplatelet therapy Cardiac rehabilitation   CARDIAC CATHETERIZATION  Result Date: 11/19/2020  2nd Mrg lesion is 95% stenosed.  A drug-eluting stent was successfully placed using a STENT SYNERGY DES 2.25X20.  Post intervention, there is a 0% residual stenosis.  1.  Non-ST elevation myocardial infarction 2.  High-grade 95%  ulcerated stenosis OM 2 3.  Successful PCI with 2.25 x 20 mm Synergy drug-eluting stent OM 2 Recommendations 1.  Dual antiplatelet therapy uninterrupted for 1 year 2.  Aggressive risk factor modification 3.  Probable discharge in a.m.   ECHOCARDIOGRAM COMPLETE  Result Date: 11/19/2020    ECHOCARDIOGRAM REPORT   Patient Name:   Micah NoelSHANNON Waldo Date of Exam: 11/19/2020 Medical Rec #:  [id number]         Height:       76.0 in Accession #:    [id number]        Weight:       285.0 lb Date of Birth:  1981/01/25        BSA:          2.575 m Patient Age:    39 years          BP:           140/125 mmHg Patient Gender: M  HR:           93 bpm. Exam Location:  [facility] Procedure: 2D Echo, Color Doppler, Cardiac Doppler and Intracardiac            Opacification Agent Indications:     R07.9 Chest Pain  History:         Patient has no prior history of Echocardiogram examinations.                  Risk Factors:Hypertension, Diabetes and Dyslipidemia.  Sonographer:     Humphrey Rolls RDCS (AE) Referring Phys:  [id number] Angie Fava Diagnosing Phys: Marcina Millard MD  Sonographer Comments: Technically difficult study due to poor echo windows. Image acquisition challenging due to patient body habitus. IMPRESSIONS  1. Left ventricular ejection fraction, by estimation, is 55 to 60%. The left ventricle has normal function. The left ventricle has no regional wall motion abnormalities. Left ventricular diastolic parameters were normal.  2. Right ventricular systolic function is normal. The right ventricular size is normal.  3. The mitral valve is normal in structure. Trivial mitral valve regurgitation. No evidence of mitral stenosis.  4. The aortic valve is normal in structure. Aortic valve regurgitation is not visualized. No aortic stenosis is present.  5. The inferior vena cava is normal in size with greater than 50% respiratory variability, suggesting right atrial pressure of 3 mmHg. FINDINGS  Left Ventricle: Left  ventricular ejection fraction, by estimation, is 55 to 60%. The left ventricle has normal function. The left ventricle has no regional wall motion abnormalities. Definity contrast agent was given IV to delineate the left ventricular  endocardial borders. The left ventricular internal cavity size was normal in size. There is no left ventricular hypertrophy. Left ventricular diastolic parameters were normal. Right Ventricle: The right ventricular size is normal. No increase in right ventricular wall thickness. Right ventricular systolic function is normal. Left Atrium: Left atrial size was normal in size. Right Atrium: Right atrial size was normal in size. Pericardium: There is no evidence of pericardial effusion. Mitral Valve: The mitral valve is normal in structure. Trivial mitral valve regurgitation. No evidence of mitral valve stenosis. MV peak gradient, 1.7 mmHg. The mean mitral valve gradient is 1.0 mmHg. Tricuspid Valve: The tricuspid valve is normal in structure. Tricuspid valve regurgitation is trivial. No evidence of tricuspid stenosis. Aortic Valve: The aortic valve is normal in structure. Aortic valve regurgitation is not visualized. No aortic stenosis is present. Aortic valve mean gradient measures 3.0 mmHg. Aortic valve peak gradient measures 6.0 mmHg. Aortic valve area, by VTI measures 3.49 cm. Pulmonic Valve: The pulmonic valve was normal in structure. Pulmonic valve regurgitation is not visualized. No evidence of pulmonic stenosis. Aorta: The aortic root is normal in size and structure. Venous: The inferior vena cava is normal in size with greater than 50% respiratory variability, suggesting right atrial pressure of 3 mmHg. IAS/Shunts: No atrial level shunt detected by color flow Doppler.  LEFT VENTRICLE PLAX 2D LVOT diam:     2.50 cm      Diastology LV SV:         64           LV e' medial:    6.64 cm/s LV SV Index:   25           LV E/e' medial:  9.2 LVOT Area:     4.91 cm     LV e' lateral:    16.80 cm/s  LV E/e' lateral: 3.6  LV Volumes (MOD) LV vol d, MOD A2C: 108.0 ml LV vol d, MOD A4C: 119.0 ml LV vol s, MOD A2C: 49.2 ml LV vol s, MOD A4C: 48.5 ml LV SV MOD A2C:     58.8 ml LV SV MOD A4C:     119.0 ml LV SV MOD BP:      66.1 ml RIGHT VENTRICLE RV Basal diam:  2.10 cm LEFT ATRIUM             Index      RIGHT ATRIUM           Index LA Vol (A2C):   23.5 ml 9.13 ml/m RA Area:     11.10 cm LA Vol (A4C):   24.6 ml 9.55 ml/m RA Volume:   26.30 ml  10.21 ml/m LA Biplane Vol: 24.7 ml 9.59 ml/m  AORTIC VALVE                   PULMONIC VALVE AV Area (Vmax):    3.54 cm    PV Vmax:       1.09 m/s AV Area (Vmean):   3.30 cm    PV Vmean:      83.300 cm/s AV Area (VTI):     3.49 cm    PV VTI:        0.189 m AV Vmax:           122.00 cm/s PV Peak grad:  4.8 mmHg AV Vmean:          85.100 cm/s PV Mean grad:  3.0 mmHg AV VTI:            0.184 m AV Peak Grad:      6.0 mmHg AV Mean Grad:      3.0 mmHg LVOT Vmax:         88.10 cm/s LVOT Vmean:        57.200 cm/s LVOT VTI:          0.131 m LVOT/AV VTI ratio: 0.71  AORTA Ao Root diam: 3.60 cm MITRAL VALVE MV Area (PHT): 7.16 cm    SHUNTS MV Area VTI:   4.80 cm    Systemic VTI:  0.13 m MV Peak grad:  1.7 mmHg    Systemic Diam: 2.50 cm MV Mean grad:  1.0 mmHg MV Vmax:       0.66 m/s MV Vmean:      46.1 cm/s MV Decel Time: 106 msec MV E velocity: 61.30 cm/s MV A velocity: 63.80 cm/s MV E/A ratio:  0.96 Marcina Millard MD Electronically signed by Marcina Millard MD Signature Date/Time: 11/19/2020/2:24:57 PM    Final     Microbiology: Recent Results (from the past 240 hour(s))  Resp Panel by RT-PCR (Flu A&B, Covid) Nasopharyngeal Swab     Status: None   Collection Time: 11/18/20  4:53 PM   Specimen: Nasopharyngeal Swab; Nasopharyngeal(NP) swabs in vial transport medium  Result Value Ref Range Status   SARS Coronavirus 2 by RT PCR NEGATIVE NEGATIVE Final    Comment: (NOTE) SARS-CoV-2 target nucleic acids are NOT  DETECTED.  The SARS-CoV-2 RNA is generally detectable in upper respiratory specimens during the acute phase of infection. The lowest concentration of SARS-CoV-2 viral copies this assay can detect is 138 copies/mL. A negative result does not preclude SARS-Cov-2 infection and should not be used as the sole basis for treatment or other patient management decisions. A negative result may occur with  improper specimen collection/handling,  submission of specimen other than nasopharyngeal swab, presence of viral mutation(s) within the areas targeted by this assay, and inadequate number of viral copies(<138 copies/mL). A negative result must be combined with clinical observations, patient history, and epidemiological information. The expected result is Negative.  Fact Sheet for Patients:  BloggerCourse.com  Fact Sheet for Healthcare Providers:  SeriousBroker.it  This test is no t yet approved or cleared by the [place] FDA and  has been authorized for detection and/or diagnosis of SARS-CoV-2 by FDA under an Emergency Use Authorization (EUA). This EUA will remain  in effect (meaning this test can be used) for the duration of the COVID-19 declaration under Section 564(b)(1) of the Act, 21 U.S.C.section 360bbb-3(b)(1), unless the authorization is terminated  or revoked sooner.       Influenza A by PCR NEGATIVE NEGATIVE Final   Influenza B by PCR NEGATIVE NEGATIVE Final    Comment: (NOTE) The Xpert Xpress SARS-CoV-2/FLU/RSV plus assay is intended as an aid in the diagnosis of influenza from Nasopharyngeal swab specimens and should not be used as a sole basis for treatment. Nasal washings and aspirates are unacceptable for Xpert Xpress SARS-CoV-2/FLU/RSV testing.  Fact Sheet for Patients: BloggerCourse.com  Fact Sheet for Healthcare Providers: SeriousBroker.it  This test is not yet  approved or cleared by the [place] FDA and has been authorized for detection and/or diagnosis of SARS-CoV-2 by FDA under an Emergency Use Authorization (EUA). This EUA will remain in effect (meaning this test can be used) for the duration of the COVID-19 declaration under Section 564(b)(1) of the Act, 21 U.S.C. section 360bbb-3(b)(1), unless the authorization is terminated or revoked.  Performed at [facility], [address] [place], [place] [address]      Labs: Basic Metabolic Panel: Recent Labs  Lab 11/18/20 1411 11/18/20 1637 11/19/20 0522 11/20/20 0723  NA 133*  --  135 134*  K 4.2  --  4.7 4.2  CL 100  --  101 102  CO2 23  --  24 22  GLUCOSE 180*  --  198* 309*  BUN 9  --  12 11  CREATININE 0.80  --  0.82 0.92  CALCIUM 9.7  --  9.3 8.8*  MG  --  2.2 1.9  --    Liver Function Tests: Recent Labs  Lab 11/19/20 0522 11/20/20 0723  AST 52* 34  ALT 48* 41  ALKPHOS 55 60  BILITOT 1.6* 1.0  PROT 7.9 7.3  ALBUMIN 4.5 4.1   Recent Labs  Lab 11/18/20 1411  LIPASE 36   No results for input(s): AMMONIA in the last 168 hours. CBC: Recent Labs  Lab 11/18/20 1411 11/19/20 0408 11/20/20 0723  WBC 8.0 9.4 6.9  HGB 15.5 15.4 14.4  HCT 44.7 44.2 42.6  MCV 84.0 84.4 85.0  PLT 188 177 158   Cardiac Enzymes: No results for input(s): CKTOTAL, CKMB, CKMBINDEX, TROPONINI in the last 168 hours. BNP: BNP (last 3 results) No results for input(s): BNP in the last 8760 hours.  ProBNP (last 3 results) No results for input(s): PROBNP in the last 8760 hours.  CBG: Recent Labs  Lab 11/19/20 1306 11/19/20 1505 11/19/20 1831 11/19/20 2121 11/20/20 0735  GLUCAP 227* 184* 130* 229* 302*       Signed:  Silvano Bilis MD.  [facility] 11/20/2020, 9:46 AM

## 2020-11-21 ENCOUNTER — Encounter: Payer: Self-pay | Admitting: Family Medicine

## 2020-11-23 ENCOUNTER — Encounter: Payer: Self-pay | Admitting: Family Medicine

## 2020-11-23 NOTE — Telephone Encounter (Signed)
FYI to Dr. G. 

## 2020-11-25 ENCOUNTER — Encounter: Payer: PRIVATE HEALTH INSURANCE | Attending: Cardiology | Admitting: *Deleted

## 2020-11-25 ENCOUNTER — Other Ambulatory Visit: Payer: Self-pay

## 2020-11-25 DIAGNOSIS — Z955 Presence of coronary angioplasty implant and graft: Secondary | ICD-10-CM | POA: Insufficient documentation

## 2020-11-25 DIAGNOSIS — I214 Non-ST elevation (NSTEMI) myocardial infarction: Secondary | ICD-10-CM | POA: Insufficient documentation

## 2020-11-25 NOTE — Telephone Encounter (Signed)
Noted  

## 2020-11-25 NOTE — Progress Notes (Signed)
Initial telephone orientation completed. Diagnosis can be found in [facility] 3/2. EP orientation scheduled for Wednesday 3/16 at 8am.

## 2020-11-26 ENCOUNTER — Encounter: Payer: Self-pay | Admitting: Family Medicine

## 2020-11-26 MED ORDER — CLOPIDOGREL BISULFATE 75 MG PO TABS: 75.0000 mg | ORAL_TABLET | Freq: Every day | ORAL | 1 refills | Status: DC

## 2020-11-30 ENCOUNTER — Other Ambulatory Visit: Payer: Self-pay

## 2020-11-30 ENCOUNTER — Encounter: Payer: Self-pay | Admitting: Family Medicine

## 2020-11-30 ENCOUNTER — Ambulatory Visit (INDEPENDENT_AMBULATORY_CARE_PROVIDER_SITE_OTHER): Payer: PRIVATE HEALTH INSURANCE | Admitting: Family Medicine

## 2020-11-30 VITALS — BP 120/72 | HR 104 | Temp 97.7°F | Ht 76.0 in | Wt 270.0 lb

## 2020-11-30 DIAGNOSIS — R000 Tachycardia, unspecified: Secondary | ICD-10-CM

## 2020-11-30 DIAGNOSIS — E1169 Type 2 diabetes mellitus with other specified complication: Secondary | ICD-10-CM

## 2020-11-30 DIAGNOSIS — G4733 Obstructive sleep apnea (adult) (pediatric): Secondary | ICD-10-CM | POA: Diagnosis not present

## 2020-11-30 DIAGNOSIS — IMO0002 Reserved for concepts with insufficient information to code with codable children: Secondary | ICD-10-CM

## 2020-11-30 DIAGNOSIS — E669 Obesity, unspecified: Secondary | ICD-10-CM

## 2020-11-30 DIAGNOSIS — E1140 Type 2 diabetes mellitus with diabetic neuropathy, unspecified: Secondary | ICD-10-CM

## 2020-11-30 DIAGNOSIS — I214 Non-ST elevation (NSTEMI) myocardial infarction: Secondary | ICD-10-CM | POA: Diagnosis not present

## 2020-11-30 DIAGNOSIS — I10 Essential (primary) hypertension: Secondary | ICD-10-CM | POA: Diagnosis not present

## 2020-11-30 DIAGNOSIS — E785 Hyperlipidemia, unspecified: Secondary | ICD-10-CM

## 2020-11-30 DIAGNOSIS — E1165 Type 2 diabetes mellitus with hyperglycemia: Secondary | ICD-10-CM

## 2020-11-30 MED ORDER — MULTIVITAMIN ADULT PO TABS: 1.0000 | ORAL_TABLET | Freq: Every day | ORAL | Status: AC

## 2020-11-30 MED ORDER — SITAGLIPTIN PHOSPHATE 25 MG PO TABS: 25.0000 mg | ORAL_TABLET | Freq: Every day | ORAL | 3 refills | Status: DC

## 2020-11-30 MED ORDER — LANTUS SOLOSTAR 100 UNIT/ML ~~LOC~~ SOPN: 10.0000 [IU] | PEN_INJECTOR | Freq: Every day | SUBCUTANEOUS | 1 refills | Status: DC

## 2020-11-30 NOTE — Progress Notes (Signed)
Patient ID: Jasmin Trumbull, male    DOB: 09-Aug-1981, 40 y.o.   MRN: [id number]  This visit was conducted in person.  BP 120/72 (BP Location: Right Arm, Cuff Size: Large)   Pulse (!) 104   Temp 97.7 F (36.5 C) (Temporal)   Ht 6\' 4"  (1.93 m)   Wt 270 lb (122.5 kg)   SpO2 97%   BMI 32.87 kg/m    CC: hosp f/u visit  Subjective:   HPI: Kane Kusek is a 40 y.o. male presenting on 11/30/2020 for Hospitalization Follow-up (Admitted on 11/18/20 to [facility], dx NSTEMI.  Wants to discuss Blood Sugar Support supplement. )   Recent hospitalization for NSTEMI, records reviewed.  Presented with 3 separate episodes of self limited chest discomfort.  Troponin elevated. COVID negative.  L heart catheterization showed:  Dist RCA lesion is 65% stenosed.  2nd Mrg lesion is 85% stenosed.  Prox LAD lesion is 20% stenosed.  Prox RCA lesion is 30% stenosed S/p stent placement of obtuse marginal   Discharged on aspirin + brilinta --> changed to aspirin + plavix due to cost of brilinta.  Discharged on invokana -->unaffordable. GLP1 RA also unaffordable.  Planned f/u with cardiology and cardiac rehab.  Glimepiride decreased to 2mg  daily.  Atenolol changed to metoprolol, lisinopril 5mg  started.  Lovastatin change to atorvastatin 80mg  daily.  OSA - rec start CPAP. Previously saw  Planned f/u with cardiology [facility]) 12/28/2020  Thinking about changing insurance at end of 2022 for better med coverage.  Since home feeling well. Notes some elevated BP readings at home.  cbg's 190-210 fasting in the mornings.    Admit date: 11/18/2020 Discharge date: 11/20/2020 TCM hosp f/u phone call completed on 11/20/2020  Recommendations for Outpatient Follow-up:  1. Cardiology f/u 1 week 2. pcp f/u 1-2 weeks 3. Will need bmp in 1-2 weeks 4. Referred to cardiac rehab 5. Needs to start cpap for osa   Discharge Diagnoses:  Principal Problem:   NSTEMI (non-ST elevated myocardial infarction)  (HCC) Active Problems:   Dyslipidemia associated with type 2 diabetes mellitus (HCC)   NAFLD (nonalcoholic fatty liver disease)   HTN (hypertension)   Type 2 diabetes, uncontrolled, with neuropathy (HCC)   Chest pain   Discharge Condition: good  Diet recommendation: heart healthy     Relevant past medical, surgical, family and social history reviewed and updated as indicated. Interim medical history since our last visit reviewed. Allergies and medications reviewed and updated. Outpatient Medications Prior to Visit  Medication Sig Dispense Refill  . aspirin EC 81 MG EC tablet Take 1 tablet (81 mg total) by mouth daily. Swallow whole. 30 tablet 11  . atorvastatin (LIPITOR) 80 MG tablet Take 1 tablet (80 mg total) by mouth at bedtime. 30 tablet 1  . citalopram (CELEXA) 20 MG tablet Take 1 tablet by mouth once daily (Patient taking differently: Take 20 mg by mouth daily.) 90 tablet 1  . clopidogrel (PLAVIX) 75 MG tablet Take 1 tablet (75 mg total) by mouth daily. 90 tablet 1  . glimepiride (AMARYL) 2 MG tablet Take 1 tablet (2 mg total) by mouth daily with breakfast. Take 1 tablet by mouth once daily with breakfast 30 tablet 1  . lisinopril (ZESTRIL) 5 MG tablet Take 1 tablet (5 mg total) by mouth daily. 30 tablet 1  . metFORMIN (GLUCOPHAGE) 1000 MG tablet TAKE 1 TABLET BY MOUTH TWICE DAILY WITH MEALS (Patient taking differently: Take 1,000 mg by mouth 2 (two) times daily with a meal.)  180 tablet 3  . metoprolol succinate (TOPROL XL) 25 MG 24 hr tablet Take 1 tablet (25 mg total) by mouth daily. 30 tablet 11  . Omega-3 Fatty Acids (FISH OIL) 1200 MG CAPS Take 1 capsule by mouth in the morning and at bedtime.    . vitamin B-12 (V-R VITAMIN B-12) 500 MCG tablet Take 1 tablet (500 mcg total) by mouth daily.    . canagliflozin (INVOKANA) 100 MG TABS tablet Take 1 tablet (100 mg total) by mouth daily before breakfast. 30 tablet 1   No facility-administered medications prior to visit.      Per HPI unless specifically indicated in ROS section below Review of Systems Objective:  BP 120/72 (BP Location: Right Arm, Cuff Size: Large)   Pulse (!) 104   Temp 97.7 F (36.5 C) (Temporal)   Ht 6\' 4"  (1.93 m)   Wt 270 lb (122.5 kg)   SpO2 97%   BMI 32.87 kg/m   Wt Readings from Last 3 Encounters:  11/30/20 270 lb (122.5 kg)  11/19/20 285 lb (129.3 kg)  11/18/20 285 lb (129.3 kg)      Physical Exam Vitals and nursing note reviewed.  Constitutional:      Appearance: Normal appearance. He is not ill-appearing.  Cardiovascular:     Rate and Rhythm: Regular rhythm. Tachycardia present.     Pulses: Normal pulses.     Heart sounds: Normal heart sounds. No murmur heard.     Comments: Mild tachycardia Pulmonary:     Effort: Pulmonary effort is normal. No respiratory distress.     Breath sounds: Normal breath sounds. No wheezing, rhonchi or rales.  Neurological:     Mental Status: He is alert.  Psychiatric:        Mood and Affect: Mood normal.        Behavior: Behavior normal.       Assessment & Plan:  This visit occurred during the SARS-CoV-2 public health emergency.  Safety protocols were in place, including screening questions prior to the visit, additional usage of staff PPE, and extensive cleaning of exam room while observing appropriate contact time as indicated for disinfecting solutions.   Problem List Items Addressed This Visit    Dyslipidemia associated with type 2 diabetes mellitus (HCC)    Now on high intensity statin.  The ASCVD Risk score 01/18/21 DC Jr., et al., 2013) failed to calculate for the following reasons:   The patient has a prior MI or stroke diagnosis       Relevant Medications   insulin glargine (LANTUS SOLOSTAR) 100 UNIT/ML Solostar Pen   HTN (hypertension)    Chronic, stable on current regimen.  Consider increased b-blocker dose if remains tachycardic.       Type 2 diabetes, uncontrolled, with neuropathy (HCC)    Recommended medications  unaffordable at this time (GLP1RA, SGLT2). Even 2014 unaffordable. Will start basal insulin at 10u daily with slow titration up to 20u daily, discussed option of stopping glimepiride of sugars start dropping <80. 15-15 rule for hypoglycemia provided. Reassess at f/u visit.       Relevant Medications   insulin glargine (LANTUS SOLOSTAR) 100 UNIT/ML Solostar Pen   Obesity, Class I, BMI 30.0-34.9 (see actual BMI)   OSA (obstructive sleep apnea)    # provided to pulm office to call and get set up with CPAP machine.       Tachycardia    Consider up titration of beta blocker if persistent tachycardia  NSTEMI (non-ST elevated myocardial infarction) (HCC) - Primary    Recent hospitalization for NSTEMI s/p PCI/stent of small branch. Medical management for less obstructed vessels including 60% RCA. Reviewed med regimen changes since hospitalization. Trouble affording meds - better with clopidogrel + aspirin. Planned cardiac rehab and cards f/u next mohth.           Meds ordered this encounter  Medications  . DISCONTD: sitaGLIPtin (JANUVIA) 25 MG tablet    Sig: Take 1 tablet (25 mg total) by mouth daily.    Dispense:  30 tablet    Refill:  3    To price out in place of invokana  . Multiple Vitamin (MULTIVITAMIN ADULT) TABS    Sig: Take 1 tablet by mouth daily. Nature's nutrition Blood sugar support - with cinnamon, chromium, and ALA  . insulin glargine (LANTUS SOLOSTAR) 100 UNIT/ML Solostar Pen    Sig: Inject 10 Units into the skin daily.    Dispense:  15 mL    Refill:  1   No orders of the defined types were placed in this encounter.  Patient Instructions  Labs today  Start lantus 10 units daily. If sugars dropping <80, stop glimepiride. Titrate lantus by 2 units every 3 days if average fasting sugar staying >150. May increase up to 20 units at this time. Let me know if any questions with lantus.  Continue monitoring blood pressures at home - let me know if staying >140/90 or  pulse >100 to increase metoprolol dose.  Call Dr Trena Platt office (pulm) to get set up with CPAP machine : [phone].  Keep follow up appointment next month.   The 15-15 rule for low sugars: If sugar reading below 70, have 15 grams of carbohydrate to raise your blood sugar and check it after 15 minutes. If it's still below 70 mg/dL, have another serving. 15 grams of carbs may be: -Glucose tablets (see instructions) -Gel tube (see instructions) -4 ounces (1/2 cup) of juice or regular soda (not diet) -1 tablespoon of sugar, honey, or corn syrup -Hard candies, jellybeans or gumdrops--see food label for how many to consume  Repeat these steps until your blood sugar is at least 70 mg/dL. Once your blood sugar is back to normal, eat a meal or snack to make sure it doesn't lower again.      Follow up plan: Return if symptoms worsen or fail to improve.  [name], MD

## 2020-11-30 NOTE — Assessment & Plan Note (Signed)
#   provided to pulm office to call and get set up with CPAP machine.

## 2020-11-30 NOTE — Assessment & Plan Note (Signed)
Chronic, stable on current regimen.  Consider increased b-blocker dose if remains tachycardic.

## 2020-11-30 NOTE — Assessment & Plan Note (Signed)
Recent hospitalization for NSTEMI s/p PCI/stent of small branch. Medical management for less obstructed vessels including 60% RCA. Reviewed med regimen changes since hospitalization. Trouble affording meds - better with clopidogrel + aspirin. Planned cardiac rehab and cards f/u next mohth.

## 2020-11-30 NOTE — Assessment & Plan Note (Signed)
Consider up titration of beta blocker if persistent tachycardia

## 2020-11-30 NOTE — Assessment & Plan Note (Signed)
Recommended medications unaffordable at this time (GLP1RA, SGLT2). Even [place] unaffordable. Will start basal insulin at 10u daily with slow titration up to 20u daily, discussed option of stopping glimepiride of sugars start dropping <80. 15-15 rule for hypoglycemia provided. Reassess at f/u visit.

## 2020-11-30 NOTE — Patient Instructions (Addendum)
Labs today  Start lantus 10 units daily. If sugars dropping <80, stop glimepiride. Titrate lantus by 2 units every 3 days if average fasting sugar staying >150. May increase up to 20 units at this time. Let me know if any questions with lantus.  Continue monitoring blood pressures at home - let me know if staying >140/90 or pulse >100 to increase metoprolol dose.  Call Dr Trena Platt office (pulm) to get set up with CPAP machine : [phone].  Keep follow up appointment next month.   The 15-15 rule for low sugars: If sugar reading below 70, have 15 grams of carbohydrate to raise your blood sugar and check it after 15 minutes. If it's still below 70 mg/dL, have another serving. 15 grams of carbs may be: -Glucose tablets (see instructions) -Gel tube (see instructions) -4 ounces (1/2 cup) of juice or regular soda (not diet) -1 tablespoon of sugar, honey, or corn syrup -Hard candies, jellybeans or gumdrops--see food label for how many to consume  Repeat these steps until your blood sugar is at least 70 mg/dL. Once your blood sugar is back to normal, eat a meal or snack to make sure it doesn't lower again.

## 2020-11-30 NOTE — Assessment & Plan Note (Signed)
Now on high intensity statin.  The ASCVD Risk score James [name]., et al., 2013) failed to calculate for the following reasons:   The patient has a prior MI or stroke diagnosis

## 2020-12-02 ENCOUNTER — Encounter: Payer: PRIVATE HEALTH INSURANCE | Admitting: *Deleted

## 2020-12-02 ENCOUNTER — Other Ambulatory Visit: Payer: Self-pay

## 2020-12-02 ENCOUNTER — Encounter: Payer: Self-pay | Admitting: Family Medicine

## 2020-12-02 VITALS — Ht 77.1 in | Wt 276.0 lb

## 2020-12-02 DIAGNOSIS — I214 Non-ST elevation (NSTEMI) myocardial infarction: Secondary | ICD-10-CM | POA: Diagnosis not present

## 2020-12-02 DIAGNOSIS — Z955 Presence of coronary angioplasty implant and graft: Secondary | ICD-10-CM | POA: Diagnosis not present

## 2020-12-02 NOTE — Progress Notes (Signed)
Cardiac Individual Treatment Plan  Patient Details  Name: James [name] MRN: [id number] Date of Birth: 1981/01/01 Referring Provider:   Flowsheet Row Cardiac Rehab from 12/02/2020 in [facility] Cardiac and Pulmonary Rehab  Referring Provider Paraschos, Alexander MD  [Dr. Arnoldo Hooker      Initial Encounter Date:  Flowsheet Row Cardiac Rehab from 12/02/2020 in [facility] Cardiac and Pulmonary Rehab  Date 12/02/20      Visit Diagnosis: NSTEMI (non-ST elevated myocardial infarction) [facility])  Status post coronary artery stent placement  Patient's Home Medications on Admission:  Current Outpatient Medications:    aspirin EC 81 MG EC tablet, Take 1 tablet (81 mg total) by mouth daily. Swallow whole., Disp: 30 tablet, Rfl: 11   atorvastatin (LIPITOR) 80 MG tablet, Take 1 tablet (80 mg total) by mouth at bedtime., Disp: 30 tablet, Rfl: 1   citalopram (CELEXA) 20 MG tablet, Take 1 tablet by mouth once daily (Patient taking differently: Take 20 mg by mouth daily.), Disp: 90 tablet, Rfl: 1   clopidogrel (PLAVIX) 75 MG tablet, Take 1 tablet (75 mg total) by mouth daily., Disp: 90 tablet, Rfl: 1   glimepiride (AMARYL) 2 MG tablet, Take 1 tablet (2 mg total) by mouth daily with breakfast. Take 1 tablet by mouth once daily with breakfast, Disp: 30 tablet, Rfl: 1   insulin glargine (LANTUS SOLOSTAR) 100 UNIT/ML Solostar Pen, Inject 10 Units into the skin daily., Disp: 15 mL, Rfl: 1   lisinopril (ZESTRIL) 5 MG tablet, Take 1 tablet (5 mg total) by mouth daily., Disp: 30 tablet, Rfl: 1   metFORMIN (GLUCOPHAGE) 1000 MG tablet, TAKE 1 TABLET BY MOUTH TWICE DAILY WITH MEALS (Patient taking differently: Take 1,000 mg by mouth 2 (two) times daily with a meal.), Disp: 180 tablet, Rfl: 3   metoprolol succinate (TOPROL XL) 25 MG 24 hr tablet, Take 1 tablet (25 mg total) by mouth daily., Disp: 30 tablet, Rfl: 11   Multiple Vitamin (MULTIVITAMIN ADULT) TABS, Take 1 tablet by mouth daily. Nature's nutrition  Blood sugar support - with cinnamon, chromium, and ALA, Disp: , Rfl:    Omega-3 Fatty Acids (FISH OIL) 1200 MG CAPS, Take 1 capsule by mouth in the morning and at bedtime., Disp: , Rfl:    vitamin B-12 (V-R VITAMIN B-12) 500 MCG tablet, Take 1 tablet (500 mcg total) by mouth daily., Disp: , Rfl:   Past Medical History: Past Medical History:  Diagnosis Date   Diabetes type 2, controlled (HCC)    History of asthma childhood   dust mites   History of pneumonia 09/2014   LLL   HTN (hypertension)    Major depressive disorder, recurrent episode, moderate (HCC) 12/26/2015   Mixed hyperlipidemia    NAFLD (nonalcoholic fatty liver disease)    Obesity    Suicidal thoughts 12/25/2015   s/p ER evaluation    Tobacco Use: Social History   Tobacco Use  Smoking Status Never Smoker  Smokeless Tobacco Never Used    Labs: Recent Review Flowsheet Data    Labs for ITP Cardiac and Pulmonary Rehab Latest Ref Rng & Units 04/17/2019 07/29/2019 11/26/2019 02/28/2020 06/09/2020   Cholestrol 0 - 200 mg/dL 466 - - - 599   LDLCALC 0 - 99 mg/dL - - - - -   LDLDIRECT mg/dL 35.7 - - - 01.7   HDL >79.39 mg/dL 03.00(P) - - - 23.30(Q)   Trlycerides 0.0 - 149.0 mg/dL 762.0(H) - - - 351.0(H)   Hemoglobin A1c 4.6 - 6.5 % 8.6(A) 7.7(A) 9.0(A)  8.0(A) 7.2(H)       Exercise Target Goals: Exercise Program Goal: Individual exercise prescription set using results from initial 6 min walk test and THRR while considering  patients activity barriers and safety.   Exercise Prescription Goal: Initial exercise prescription builds to 30-45 minutes a day of aerobic activity, 2-3 days per week.  Home exercise guidelines will be given to patient during program as part of exercise prescription that the participant will acknowledge.   Education: Aerobic Exercise: - Group verbal and visual presentation on the components of exercise prescription. Introduces F.I.T.T principle from ACSM for exercise prescriptions.  Reviews  F.I.T.T. principles of aerobic exercise including progression. Written material given at graduation.   Education: Resistance Exercise: - Group verbal and visual presentation on the components of exercise prescription. Introduces F.I.T.T principle from ACSM for exercise prescriptions  Reviews F.I.T.T. principles of resistance exercise including progression. Written material given at graduation.    Education: Exercise & Equipment Safety: - Individual verbal instruction and demonstration of equipment use and safety with use of the equipment. Flowsheet Row Cardiac Rehab from 12/02/2020 in [facility] Cardiac and Pulmonary Rehab  Date 12/02/20  Educator [facility]  Instruction Review Code 1- Verbalizes Understanding      Education: Exercise Physiology & General Exercise Guidelines: - Group verbal and written instruction with models to review the exercise physiology of the cardiovascular system and associated critical values. Provides general exercise guidelines with specific guidelines to those with heart or lung disease.    Education: Flexibility, Balance, Mind/Body Relaxation: - Group verbal and visual presentation with interactive activity on the components of exercise prescription. Introduces F.I.T.T principle from ACSM for exercise prescriptions. Reviews F.I.T.T. principles of flexibility and balance exercise training including progression. Also discusses the mind body connection.  Reviews various relaxation techniques to help reduce and manage stress (i.e. Deep breathing, progressive muscle relaxation, and visualization). Balance handout provided to take home. Written material given at graduation.   Activity Barriers & Risk Stratification:  Activity Barriers & Cardiac Risk Stratification - 12/02/20 0925      Activity Barriers & Cardiac Risk Stratification   Activity Barriers Other (comment);Deconditioning;Muscular Weakness    Comments groin pain from cath site    Cardiac Risk Stratification Moderate            6 Minute Walk:  6 Minute Walk    Row Name 12/02/20 0925         6 Minute Walk   Phase Initial     Distance 1350 feet     Walk Time 6 minutes     # of Rest Breaks 0     MPH 2.56     METS 4.66     RPE 7     VO2 Peak 16.31     Symptoms No     Resting HR 99 bpm     Resting BP 120/64     Resting Oxygen Saturation  97 %     Exercise Oxygen Saturation  during 6 min walk 97 %     Max Ex. HR 120 bpm     Max Ex. BP 142/80     2 Minute Post BP 122/60            Oxygen Initial Assessment:   Oxygen Re-Evaluation:   Oxygen Discharge (Final Oxygen Re-Evaluation):   Initial Exercise Prescription:  Initial Exercise Prescription - 12/02/20 0900      Date of Initial Exercise RX and Referring Provider   Date 12/02/20    Referring  Provider Marcina Millard MD   Dr. Arnoldo Hooker     Treadmill   MPH 2.5    Grade 2    Minutes 15    METs 3.6      Recumbant Elliptical   Level 3    RPM 50    Minutes 15    METs 3.5      Elliptical   Level 1    Speed 2.5    Minutes 15    METs 3.5      Prescription Details   Frequency (times per week) 3    Duration Progress to 30 minutes of continuous aerobic without signs/symptoms of physical distress      Intensity   THRR 40-80% of Max Heartrate 131-164    Ratings of Perceived Exertion 11-13    Perceived Dyspnea 0-4      Progression   Progression Continue to progress workloads to maintain intensity without signs/symptoms of physical distress.      Resistance Training   Training Prescription Yes    Weight 3 lb    Reps 10-15           Perform Capillary Blood Glucose checks as needed.  Exercise Prescription Changes:  Exercise Prescription Changes    Row Name 12/02/20 0900             Response to Exercise   Blood Pressure (Admit) 120/64       Blood Pressure (Exercise) 142/80       Blood Pressure (Exit) 122/60       Heart Rate (Admit) 99 bpm       Heart Rate (Exercise) 120 bpm       Heart Rate  (Exit) 112 bpm       Oxygen Saturation (Admit) 97 %       Oxygen Saturation (Exercise) 97 %       Rating of Perceived Exertion (Exercise) 7       Symptoms none       Comments walk test results              Exercise Comments:   Exercise Goals and Review:  Exercise Goals    Row Name 12/02/20 0927             Exercise Goals   Increase Physical Activity Yes       Intervention Provide advice, education, support and counseling about physical activity/exercise needs.;Develop an individualized exercise prescription for aerobic and resistive training based on initial evaluation findings, risk stratification, comorbidities and participant's personal goals.       Expected Outcomes Short Term: Attend rehab on a regular basis to increase amount of physical activity.;Long Term: Add in home exercise to make exercise part of routine and to increase amount of physical activity.;Long Term: Exercising regularly at least 3-5 days a week.       Increase Strength and Stamina Yes       Intervention Provide advice, education, support and counseling about physical activity/exercise needs.;Develop an individualized exercise prescription for aerobic and resistive training based on initial evaluation findings, risk stratification, comorbidities and participant's personal goals.       Expected Outcomes Short Term: Increase workloads from initial exercise prescription for resistance, speed, and METs.;Short Term: Perform resistance training exercises routinely during rehab and add in resistance training at home;Long Term: Improve cardiorespiratory fitness, muscular endurance and strength as measured by increased METs and functional capacity ( )       Able to understand and use rate of perceived exertion (  RPE) scale Yes       Intervention Provide education and explanation on how to use RPE scale       Expected Outcomes Short Term: Able to use RPE daily in rehab to express subjective intensity level;Long Term:   Able to use RPE to guide intensity level when exercising independently       Able to understand and use Dyspnea scale Yes       Intervention Provide education and explanation on how to use Dyspnea scale       Expected Outcomes Short Term: Able to use Dyspnea scale daily in rehab to express subjective sense of shortness of breath during exertion;Long Term: Able to use Dyspnea scale to guide intensity level when exercising independently       Knowledge and understanding of Target Heart Rate Range (THRR) Yes       Intervention Provide education and explanation of THRR including how the numbers were predicted and where they are located for reference       Expected Outcomes Short Term: Able to state/look up THRR;Short Term: Able to use daily as guideline for intensity in rehab;Long Term: Able to use THRR to govern intensity when exercising independently       Able to check pulse independently Yes       Intervention Provide education and demonstration on how to check pulse in carotid and radial arteries.;Review the importance of being able to check your own pulse for safety during independent exercise       Expected Outcomes Short Term: Able to explain why pulse checking is important during independent exercise;Long Term: Able to check pulse independently and accurately       Understanding of Exercise Prescription Yes       Intervention Provide education, explanation, and written materials on patient's individual exercise prescription       Expected Outcomes Short Term: Able to explain program exercise prescription;Long Term: Able to explain home exercise prescription to exercise independently              Exercise Goals Re-Evaluation :   Discharge Exercise Prescription (Final Exercise Prescription Changes):  Exercise Prescription Changes - 12/02/20 0900      Response to Exercise   Blood Pressure (Admit) 120/64    Blood Pressure (Exercise) 142/80    Blood Pressure (Exit) 122/60    Heart Rate  (Admit) 99 bpm    Heart Rate (Exercise) 120 bpm    Heart Rate (Exit) 112 bpm    Oxygen Saturation (Admit) 97 %    Oxygen Saturation (Exercise) 97 %    Rating of Perceived Exertion (Exercise) 7    Symptoms none    Comments walk test results           Nutrition:  Target Goals: Understanding of nutrition guidelines, daily intake of sodium 1500mg , cholesterol 200mg , calories 30% from fat and 7% or less from saturated fats, daily to have 5 or more servings of fruits and vegetables.  Education: All About Nutrition: -Group instruction provided by verbal, written material, interactive activities, discussions, models, and posters to present general guidelines for heart healthy nutrition including fat, fiber, MyPlate, the role of sodium in heart healthy nutrition, utilization of the nutrition label, and utilization of this knowledge for meal planning. Follow up email sent as well. Written material given at graduation. Flowsheet Row Cardiac Rehab from 12/02/2020 in [facility] Cardiac and Pulmonary Rehab  Education need identified 12/02/20      Biometrics:  Pre Biometrics - 12/02/20 [phone]  Pre Biometrics   Height 6' 5.1" (1.958 m)    Weight 276 lb (125.2 kg)    BMI (Calculated) 32.66    Single Leg Stand 30 seconds            Nutrition Therapy Plan and Nutrition Goals:   Nutrition Assessments:  MEDIFICTS Score Key:  ?70 Need to make dietary changes   40-70 Heart Healthy Diet  ? 40 Therapeutic Level Cholesterol Diet  Flowsheet Row Cardiac Rehab from 12/02/2020 in [facility] Cardiac and Pulmonary Rehab  Picture Your Plate Total Score on Admission 70     Picture Your Plate Scores:  <33 Unhealthy dietary pattern with much room for improvement.  41-50 Dietary pattern unlikely to meet recommendations for good health and room for improvement.  51-60 More healthful dietary pattern, with some room for improvement.   >60 Healthy dietary pattern, although there may be some specific  behaviors that could be improved.    Nutrition Goals Re-Evaluation:   Nutrition Goals Discharge (Final Nutrition Goals Re-Evaluation):   Psychosocial: Target Goals: Acknowledge presence or absence of significant depression and/or stress, maximize coping skills, provide positive support system. Participant is able to verbalize types and ability to use techniques and skills needed for reducing stress and depression.   Education: Stress, Anxiety, and Depression - Group verbal and visual presentation to define topics covered.  Reviews how body is impacted by stress, anxiety, and depression.  Also discusses healthy ways to reduce stress and to treat/manage anxiety and depression.  Written material given at graduation.   Education: Sleep Hygiene -Provides group verbal and written instruction about how sleep can affect your health.  Define sleep hygiene, discuss sleep cycles and impact of sleep habits. Review good sleep hygiene tips.    Initial Review & Psychosocial Screening:  Initial Psych Review & Screening - 11/25/20 1041      Initial Review   Current issues with History of Depression      Family Dynamics   Good Support System? Yes      Barriers   Psychosocial barriers to participate in program There are no identifiable barriers or psychosocial needs.;The patient should benefit from training in stress management and relaxation.      Screening Interventions   Interventions Encouraged to exercise;Provide feedback about the scores to participant;To provide support and resources with identified psychosocial needs    Expected Outcomes Short Term goal: Utilizing psychosocial counselor, staff and physician to assist with identification of specific Stressors or current issues interfering with healing process. Setting desired goal for each stressor or current issue identified.;Long Term Goal: Stressors or current issues are controlled or eliminated.;Short Term goal: Identification and review  with participant of any Quality of Life or Depression concerns found by scoring the questionnaire.;Long Term goal: The participant improves quality of Life and PHQ9 Scores as seen by post scores and/or verbalization of changes           Quality of Life Scores:   Quality of Life - 12/02/20 0928      Quality of Life   Select Quality of Life      Quality of Life Scores   Health/Function Pre 29.2 %    Socioeconomic Pre 27 %    Psych/Spiritual Pre 27.86 %    Family Pre 22.8 %    GLOBAL Pre 27.91 %          Scores of 19 and below usually indicate a poorer quality of life in these areas.  A difference of  2-3  points is a clinically meaningful difference.  A difference of 2-3 points in the total score of the Quality of Life Index has been associated with significant improvement in overall quality of life, self-image, physical symptoms, and general health in studies assessing change in quality of life.  PHQ-9: Recent Review Flowsheet Data    Depression screen [facility] 2/9 12/02/2020 06/19/2020 04/17/2019 06/15/2018 09/28/2017   Decreased Interest 0 0 0 0 0   Down, Depressed, Hopeless 0 0 0 0 0   PHQ - 2 Score 0 0 0 0 0   Altered sleeping 0 - 0 - -   Tired, decreased energy 0 - 0 - -   Change in appetite 0 - 0 - -   Feeling bad or failure about yourself  0 - 0 - -   Trouble concentrating 0 - 0 - -   Moving slowly or fidgety/restless 0 - 0 - -   Suicidal thoughts 0 - 0 - -   PHQ-9 Score 0 - 0 - -   Difficult doing work/chores Not difficult at all - - - -     Interpretation of Total Score  Total Score Depression Severity:  1-4 = Minimal depression, 5-9 = Mild depression, 10-14 = Moderate depression, 15-19 = Moderately severe depression, 20-27 = Severe depression   Psychosocial Evaluation and Intervention:  Psychosocial Evaluation - 11/25/20 1048      Psychosocial Evaluation & Interventions   Interventions Encouraged to exercise with the program and follow exercise prescription    Comments  Shant reports doing well after his NSTEMI. His only complaint is his groin pain post cath if he over does it. He did state he had a history of depression during his last job a while ago, but is doing much better mentally and phsyically now. He is already back to work which is going smoothly and is "low stress." He is currently trying to work on finding two alternative medications to replace Brillinta and Invokana since they are so expensive. Besides that, he reported no sleep or current stress issues. He is hoping this program will help him stay on track of a heart healthy lifestyle.    Expected Outcomes Short: attend cardiac rehab for education and exercise. Long: develop and maintain positive self care habits.    Continue Psychosocial Services  Follow up required by staff           Psychosocial Re-Evaluation:   Psychosocial Discharge (Final Psychosocial Re-Evaluation):   Vocational Rehabilitation: Provide vocational rehab assistance to qualifying candidates.   Vocational Rehab Evaluation & Intervention:  Vocational Rehab - 11/25/20 1044      Initial Vocational Rehab Evaluation & Intervention   Assessment shows need for Vocational Rehabilitation No           Education: Education Goals: Education classes will be provided on a variety of topics geared toward better understanding of heart health and risk factor modification. Participant will state understanding/return demonstration of topics presented as noted by education test scores.  Learning Barriers/Preferences:  Learning Barriers/Preferences - 11/25/20 1046      Learning Barriers/Preferences   Learning Barriers None    Learning Preferences None           General Cardiac Education Topics:  AED/CPR: - Group verbal and written instruction with the use of models to demonstrate the basic use of the AED with the basic ABC's of resuscitation.   Anatomy and Cardiac Procedures: - Group verbal and visual presentation and  models provide information about  basic cardiac anatomy and function. Reviews the testing methods done to diagnose heart disease and the outcomes of the test results. Describes the treatment choices: Medical Management, Angioplasty, or Coronary Bypass Surgery for treating various heart conditions including Myocardial Infarction, Angina, Valve Disease, and Cardiac Arrhythmias.  Written material given at graduation. Flowsheet Row Cardiac [facility]  Education need identified 12/02/20      Medication Safety: - Group verbal and visual instruction to review commonly prescribed medications for heart and lung disease. Reviews the medication, class of the drug, and side effects. Includes the steps to properly store meds and maintain the prescription regimen.  Written material given at graduation.   Intimacy: - Group verbal instruction through game format to discuss how heart and lung disease can affect sexual intimacy. Written material given at graduation..   Know Your Numbers and Heart Failure: - Group verbal and visual instruction to discuss disease risk factors for cardiac and pulmonary disease and treatment options.  Reviews associated critical values for Overweight/Obesity, Hypertension, Cholesterol, and Diabetes.  Discusses basics of heart failure: signs/symptoms and treatments.  Introduces Heart Failure Zone chart for action plan for heart failure.  Written material given at graduation. Flowsheet [facility]  Education need identified 12/02/20      Infection Prevention: - Provides verbal and written material to individual with discussion of infection control including proper hand washing and proper equipment cleaning during exercise session. Flowsheet Row Cardiac Rehab [facility]02[facility] Cardiac [facility]  Date 12/02/20  Educator [facility]  Instruction Review Code 1- Verbalizes  Understanding      Falls Prevention: - Provides verbal and written material to individual with discussion of falls prevention and safety. Flowsheet Row [facility]  Date 12/02/20  [facility]ator [facility]  Instruction Review Code 1- Verbalizes Understanding      Other: -Provides group and verbal instruction on various topics (see comments)   Knowledge Questionnaire Score:  Knowledge Questionnaire Score - 12/02/20 0929      Knowledge Questionnaire Score   Pre Score 23/26 Education Focus: nutrition, heart failure, angina           Core Components/Risk Factors/Patient Goals at Admission:  Personal Goals and Risk Factors at Admission - 12/02/20 0930      Core Components/Risk Factors/Patient Goals on Admission    Weight Management Yes;Obesity;Weight Loss    Intervention Weight Management: Develop a combined nutrition and exercise program designed to reach desired caloric intake, while maintaining appropriate intake of nutrient and fiber, sodium and fats, and appropriate energy expenditure required for the weight goal.;Weight Management: Provide education and appropriate resources to help participant work on and attain dietary goals.;Weight Management/Obesity: Establish reasonable short term and long term weight goals.;Obesity: Provide education and appropriate resources to help participant work on and attain dietary goals.    Admit Weight 276 lb (125.2 kg)    Goal Weight: Short Term 270 lb (122.5 kg)    Goal Weight: Long Term 265 lb (120.2 kg)    Expected Outcomes Short Term: Continue to assess and modify interventions until short term weight is achieved;Long Term: Adherence to nutrition and physical activity/exercise program aimed toward attainment of established weight goal;Weight Loss: Understanding of general recommendations for a balanced deficit meal plan, which promotes 1-2 lb weight loss per week and includes a negative energy balance of  [phone] kcal/d;Understanding of distribution of calorie  intake throughout the day with the consumption of 4-5 meals/snacks;Understanding recommendations for meals to include 15-35% energy as protein, 25-35% energy from fat, 35-60% energy from carbohydrates, less than  of dietary cholesterol, 20-35 gm of total fiber daily    Diabetes Yes    Intervention Provide education about signs/symptoms and action to take for hypo/hyperglycemia.;Provide education about proper nutrition, including hydration, and aerobic/resistive exercise prescription along with prescribed medications to achieve blood glucose in normal ranges: Fasting glucose 65-99 mg/dL    Expected Outcomes Short Term: Participant verbalizes understanding of the signs/symptoms and immediate care of hyper/hypoglycemia, proper foot care and importance of medication, aerobic/resistive exercise and nutrition plan for blood glucose control.;Long Term: Attainment of HbA1C < 7%.    Hypertension Yes    Intervention Provide education on lifestyle modifcations including regular physical activity/exercise, weight management, moderate sodium restriction and increased consumption of fresh fruit, vegetables, and low fat dairy, alcohol moderation, and smoking cessation.;Monitor prescription use compliance.    Expected Outcomes Short Term: Continued assessment and intervention until BP is < 140/40mm HG in hypertensive participants. < 130/51mm HG in hypertensive participants with diabetes, heart failure or chronic kidney disease.;Long Term: Maintenance of blood pressure at goal levels.    Lipids Yes    Intervention Provide education and support for participant on nutrition & aerobic/resistive exercise along with prescribed medications to achieve LDL 70mg , HDL >40mg .    Expected Outcomes Short Term: Participant states understanding of desired cholesterol values and is compliant with medications prescribed. Participant is following exercise prescription and  nutrition guidelines.;Long Term: Cholesterol controlled with medications as prescribed, with individualized exercise RX and with personalized nutrition plan. Value goals: LDL < , HDL > 40 mg.           Education:Diabetes - Individual verbal and written instruction to review signs/symptoms of diabetes, desired ranges of glucose level fasting, after meals and with exercise. Acknowledge that pre and post exercise glucose checks will be done for 3 sessions at entry of program. Flowsheet Row Cardiac Rehab from 12/02/2020 in [facility] Cardiac and Pulmonary Rehab  Date 11/25/20  Educator [facility]  Instruction Review Code 1- Verbalizes Understanding      Core Components/Risk Factors/Patient Goals Review:    Core Components/Risk Factors/Patient Goals at Discharge (Final Review):    ITP Comments:  ITP Comments    Row Name 11/25/20 1055 12/02/20 0924         ITP Comments Initial telephone orientation completed. Diagnosis can be found in [facility] 3/2. EP orientation scheduled for Wednesday 3/16 at 8am. Completed and gym orientation. Initial ITP created and sent for review to Dr. Bethann Punches, Medical Director.             Comments: Initial ITP

## 2020-12-02 NOTE — Patient Instructions (Signed)
Patient Instructions  Patient Details  Name: James [name] MRN: [id number] Date of Birth: [birth year] Referring Provider:  Marcina Millard, MD  Below are your personal goals for exercise, nutrition, and risk factors. Our goal is to help you stay on track towards obtaining and maintaining these goals. We will be discussing your progress on these goals with you throughout the program.  Initial Exercise Prescription:  Initial Exercise Prescription - 12/02/20 0900      Date of Initial Exercise RX and Referring Provider   Date 12/02/20    Referring Provider Paraschos, Alexander MD   Dr. Arnoldo Hooker     Treadmill   MPH 2.5    Grade 2    Minutes 15    METs 3.6      Recumbant Elliptical   Level 3    RPM 50    Minutes 15    METs 3.5      Elliptical   Level 1    Speed 2.5    Minutes 15    METs 3.5      Prescription Details   Frequency (times per week) 3    Duration Progress to 30 minutes of continuous aerobic without signs/symptoms of physical distress      Intensity   THRR 40-80% of Max Heartrate 131-164    Ratings of Perceived Exertion 11-13    Perceived Dyspnea 0-4      Progression   Progression Continue to progress workloads to maintain intensity without signs/symptoms of physical distress.      Resistance Training   Training Prescription Yes    Weight 3 lb    Reps 10-15           Exercise Goals: Frequency: Be able to perform aerobic exercise two to three times per week in program working toward 2-5 days per week of home exercise.  Intensity: Work with a perceived exertion of 11 (fairly light) - 15 (hard) while following your exercise prescription.  We will make changes to your prescription with you as you progress through the program.   Duration: Be able to do 30 to 45 minutes of continuous aerobic exercise in addition to a 5 minute warm-up and a 5 minute cool-down routine.   Nutrition Goals: Your personal nutrition goals will be established when  you do your nutrition analysis with the dietician.  The following are general nutrition guidelines to follow: Cholesterol < 200mg /day Sodium < 1500mg /day Fiber: Men under 50 yrs - 38 grams per day  Personal Goals:  Personal Goals and Risk Factors at Admission - 12/02/20 0930      Core Components/Risk Factors/Patient Goals on Admission    Weight Management Yes;Obesity;Weight Loss    Intervention Weight Management: Develop a combined nutrition and exercise program designed to reach desired caloric intake, while maintaining appropriate intake of nutrient and fiber, sodium and fats, and appropriate energy expenditure required for the weight goal.;Weight Management: Provide education and appropriate resources to help participant work on and attain dietary goals.;Weight Management/Obesity: Establish reasonable short term and long term weight goals.;Obesity: Provide education and appropriate resources to help participant work on and attain dietary goals.    Admit Weight 276 lb (125.2 kg)    Goal Weight: Short Term 270 lb (122.5 kg)    Goal Weight: Long Term 265 lb (120.2 kg)    Expected Outcomes Short Term: Continue to assess and modify interventions until short term weight is achieved;Long Term: Adherence to nutrition and physical activity/exercise program aimed toward attainment of established weight goal;Weight  Loss: Understanding of general recommendations for a balanced deficit meal plan, which promotes 1-2 lb weight loss per week and includes a negative energy balance of [phone] kcal/d;Understanding of distribution of calorie intake throughout the day with the consumption of 4-5 meals/snacks;Understanding recommendations for meals to include 15-35% energy as protein, 25-35% energy from fat, 35-60% energy from carbohydrates, less than 200mg  of dietary cholesterol, 20-35 gm of total fiber daily    Diabetes Yes    Intervention Provide education about signs/symptoms and action to take for  hypo/hyperglycemia.;Provide education about proper nutrition, including hydration, and aerobic/resistive exercise prescription along with prescribed medications to achieve blood glucose in normal ranges: Fasting glucose 65-99 mg/dL    Expected Outcomes Short Term: Participant verbalizes understanding of the signs/symptoms and immediate care of hyper/hypoglycemia, proper foot care and importance of medication, aerobic/resistive exercise and nutrition plan for blood glucose control.;Long Term: Attainment of HbA1C < 7%.    Hypertension Yes    Intervention Provide education on lifestyle modifcations including regular physical activity/exercise, weight management, moderate sodium restriction and increased consumption of fresh fruit, vegetables, and low fat dairy, alcohol moderation, and smoking cessation.;Monitor prescription use compliance.    Expected Outcomes Short Term: Continued assessment and intervention until BP is < 140/41mm HG in hypertensive participants. < 130/85mm HG in hypertensive participants with diabetes, heart failure or chronic kidney disease.;Long Term: Maintenance of blood pressure at goal levels.    Lipids Yes    Intervention Provide education and support for participant on nutrition & aerobic/resistive exercise along with prescribed medications to achieve LDL 70mg , HDL >40mg .    Expected Outcomes Short Term: Participant states understanding of desired cholesterol values and is compliant with medications prescribed. Participant is following exercise prescription and nutrition guidelines.;Long Term: Cholesterol controlled with medications as prescribed, with individualized exercise RX and with personalized nutrition plan. Value goals: LDL < 70mg , HDL > 40 mg.           Tobacco Use Initial Evaluation: Social History   Tobacco Use  Smoking Status Never Smoker  Smokeless Tobacco Never Used    Exercise Goals and Review:  Exercise Goals    Row Name 12/02/20 0927              Exercise Goals   Increase Physical Activity Yes       Intervention Provide advice, education, support and counseling about physical activity/exercise needs.;Develop an individualized exercise prescription for aerobic and resistive training based on initial evaluation findings, risk stratification, comorbidities and participant's personal goals.       Expected Outcomes Short Term: Attend rehab on a regular basis to increase amount of physical activity.;Long Term: Add in home exercise to make exercise part of routine and to increase amount of physical activity.;Long Term: Exercising regularly at least 3-5 days a week.       Increase Strength and Stamina Yes       Intervention Provide advice, education, support and counseling about physical activity/exercise needs.;Develop an individualized exercise prescription for aerobic and resistive training based on initial evaluation findings, risk stratification, comorbidities and participant's personal goals.       Expected Outcomes Short Term: Increase workloads from initial exercise prescription for resistance, speed, and METs.;Short Term: Perform resistance training exercises routinely during rehab and add in resistance training at home;Long Term: Improve cardiorespiratory fitness, muscular endurance and strength as measured by increased METs and functional capacity (12/04/20)       Able to understand and use rate of perceived exertion (RPE) scale Yes  Intervention Provide education and explanation on how to use RPE scale       Expected Outcomes Short Term: Able to use RPE daily in rehab to express subjective intensity level;Long Term:  Able to use RPE to guide intensity level when exercising independently       Able to understand and use Dyspnea scale Yes       Intervention Provide education and explanation on how to use Dyspnea scale       Expected Outcomes Short Term: Able to use Dyspnea scale daily in rehab to express subjective sense of shortness of breath  during exertion;Long Term: Able to use Dyspnea scale to guide intensity level when exercising independently       Knowledge and understanding of Target Heart Rate Range (THRR) Yes       Intervention Provide education and explanation of THRR including how the numbers were predicted and where they are located for reference       Expected Outcomes Short Term: Able to state/look up THRR;Short Term: Able to use daily as guideline for intensity in rehab;Long Term: Able to use THRR to govern intensity when exercising independently       Able to check pulse independently Yes       Intervention Provide education and demonstration on how to check pulse in carotid and radial arteries.;Review the importance of being able to check your own pulse for safety during independent exercise       Expected Outcomes Short Term: Able to explain why pulse checking is important during independent exercise;Long Term: Able to check pulse independently and accurately       Understanding of Exercise Prescription Yes       Intervention Provide education, explanation, and written materials on patient's individual exercise prescription       Expected Outcomes Short Term: Able to explain program exercise prescription;Long Term: Able to explain home exercise prescription to exercise independently              Copy of goals given to participant.

## 2020-12-03 MED ORDER — METOPROLOL SUCCINATE ER 25 MG PO TB24: 37.5000 mg | ORAL_TABLET | Freq: Every day | ORAL | 6 refills | Status: DC

## 2020-12-07 ENCOUNTER — Other Ambulatory Visit: Payer: Self-pay

## 2020-12-07 ENCOUNTER — Encounter: Payer: PRIVATE HEALTH INSURANCE | Admitting: *Deleted

## 2020-12-07 DIAGNOSIS — I214 Non-ST elevation (NSTEMI) myocardial infarction: Secondary | ICD-10-CM

## 2020-12-07 DIAGNOSIS — Z955 Presence of coronary angioplasty implant and graft: Secondary | ICD-10-CM

## 2020-12-07 LAB — GLUCOSE, CAPILLARY
Glucose-Capillary: 191 mg/dL — ABNORMAL HIGH (ref 70–99)
Glucose-Capillary: 128 mg/dL — ABNORMAL HIGH (ref 70–99)

## 2020-12-07 NOTE — Progress Notes (Signed)
Daily Session Note  Patient Details  Name: James [name] MRN: [id number] Date of Birth: 17-Nov-1980 Referring Provider:   Flowsheet Row Cardiac Rehab from 12/02/2020 in [facility] Cardiac and Pulmonary Rehab  Referring Provider [name] MD  [Dr. Serafina Royals      Encounter Date: 12/07/2020  Check In:  Session Check In - 12/07/20 1717      Check-In   Supervising physician immediately available to respond to emergencies See telemetry face sheet for immediately available ER MD    Location [facility]-Cardiac & Pulmonary Rehab    Staff Present Renita Papa, RN Moises Blood, BS, ACSM CEP, Exercise Physiologist;Kara Eliezer Bottom, MS Exercise Physiologist    Virtual Visit No    Medication changes reported     Yes    Comments increased metoprolol    Fall or balance concerns reported    No    Warm-up and Cool-down Performed on first and last piece of equipment    Resistance Training Performed Yes    VAD Patient? No    PAD/SET Patient? No      Pain Assessment   Currently in Pain? No/denies              Social History   Tobacco Use  Smoking Status Never Smoker  Smokeless Tobacco Never Used    Goals Met:  Independence with exercise equipment Exercise tolerated well No report of cardiac concerns or symptoms Strength training completed today  Goals Unmet:  Not Applicable  Comments: First full day of exercise!  Patient was oriented to gym and equipment including functions, settings, policies, and procedures.  Patient's individual exercise prescription and treatment plan were reviewed.  All starting workloads were established based on the results of the 6 minute walk test done at initial orientation visit.  The plan for exercise progression was also introduced and progression will be customized based on patient's performance and goals.    Dr. Emily Filbert is Medical Director for [facility].

## 2020-12-09 ENCOUNTER — Other Ambulatory Visit: Payer: Self-pay

## 2020-12-09 ENCOUNTER — Encounter: Payer: Self-pay | Admitting: *Deleted

## 2020-12-09 DIAGNOSIS — Z955 Presence of coronary angioplasty implant and graft: Secondary | ICD-10-CM

## 2020-12-09 DIAGNOSIS — I214 Non-ST elevation (NSTEMI) myocardial infarction: Secondary | ICD-10-CM

## 2020-12-09 LAB — GLUCOSE, CAPILLARY
Glucose-Capillary: 129 mg/dL — ABNORMAL HIGH (ref 70–99)
Glucose-Capillary: 90 mg/dL (ref 70–99)
Glucose-Capillary: 103 mg/dL — ABNORMAL HIGH (ref 70–99)

## 2020-12-09 NOTE — Progress Notes (Signed)
Cardiac Individual Treatment Plan  Patient Details  Name: James [name] MRN: [id number] Date of Birth: 1981/07/10 Referring Provider:   Flowsheet Row Cardiac Rehab from 12/02/2020 in [facility] Cardiac and Pulmonary Rehab  Referring Provider Paraschos, Alexander MD  [Dr. Arnoldo Hooker      Initial Encounter Date:  Flowsheet Row Cardiac Rehab from 12/02/2020 in [facility] Cardiac and Pulmonary Rehab  Date 12/02/20      Visit Diagnosis: NSTEMI (non-ST elevated myocardial infarction) [facility])  Status post coronary artery stent placement  Patient's Home Medications on Admission:  Current Outpatient Medications:  .  aspirin EC 81 MG EC tablet, Take 1 tablet (81 mg total) by mouth daily. Swallow whole., Disp: 30 tablet, Rfl: 11 .  atorvastatin (LIPITOR) 80 MG tablet, Take 1 tablet (80 mg total) by mouth at bedtime., Disp: 30 tablet, Rfl: 1 .  citalopram (CELEXA) 20 MG tablet, Take 1 tablet by mouth once daily (Patient taking differently: Take 20 mg by mouth daily.), Disp: 90 tablet, Rfl: 1 .  clopidogrel (PLAVIX) 75 MG tablet, Take 1 tablet (75 mg total) by mouth daily., Disp: 90 tablet, Rfl: 1 .  glimepiride (AMARYL) 2 MG tablet, Take 1 tablet (2 mg total) by mouth daily with breakfast. Take 1 tablet by mouth once daily with breakfast, Disp: 30 tablet, Rfl: 1 .  insulin glargine (LANTUS SOLOSTAR) 100 UNIT/ML Solostar Pen, Inject 10 Units into the skin daily., Disp: 15 mL, Rfl: 1 .  lisinopril (ZESTRIL) 5 MG tablet, Take 1 tablet (5 mg total) by mouth daily., Disp: 30 tablet, Rfl: 1 .  metFORMIN (GLUCOPHAGE) 1000 MG tablet, TAKE 1 TABLET BY MOUTH TWICE DAILY WITH MEALS (Patient taking differently: Take 1,000 mg by mouth 2 (two) times daily with a meal.), Disp: 180 tablet, Rfl: 3 .  metoprolol succinate (TOPROL XL) 25 MG 24 hr tablet, Take 1.5 tablets (37.5 mg total) by mouth daily., Disp: 45 tablet, Rfl: 6 .  Multiple Vitamin (MULTIVITAMIN ADULT) TABS, Take 1 tablet by mouth daily. Nature's nutrition  Blood sugar support - with cinnamon, chromium, and ALA, Disp: , Rfl:  .  Omega-3 Fatty Acids (FISH OIL) 1200 MG CAPS, Take 1 capsule by mouth in the morning and at bedtime., Disp: , Rfl:  .  vitamin B-12 (V-R VITAMIN B-12) 500 MCG tablet, Take 1 tablet (500 mcg total) by mouth daily., Disp: , Rfl:   Past Medical History: Past Medical History:  Diagnosis Date  . Diabetes type 2, controlled (HCC)   . History of asthma childhood   dust mites  . History of pneumonia 09/2014   LLL  . HTN (hypertension)   . Major depressive disorder, recurrent episode, moderate (HCC) 12/26/2015  . Mixed hyperlipidemia   . NAFLD (nonalcoholic fatty liver disease)   . Obesity   . Suicidal thoughts 12/25/2015   s/p ER evaluation    Tobacco Use: Social History   Tobacco Use  Smoking Status Never Smoker  Smokeless Tobacco Never Used    Labs: Recent Review Flowsheet Data    Labs for ITP Cardiac and Pulmonary Rehab Latest Ref Rng & Units 04/17/2019 07/29/2019 11/26/2019 02/28/2020 06/09/2020   Cholestrol 0 - 200 mg/dL 409 - - - 811   LDLCALC 0 - 99 mg/dL - - - - -   LDLDIRECT mg/dL 91.4 - - - 78.2   HDL >95.62 mg/dL 13.08(M) - - - 57.84(O)   Trlycerides 0.0 - 149.0 mg/dL 962.0(H) - - - 351.0(H)   Hemoglobin A1c 4.6 - 6.5 % 8.6(A) 7.7(A) 9.0(A)  8.0(A) 7.2(H)       Exercise Target Goals: Exercise Program Goal: Individual exercise prescription set using results from initial 6 min walk test and THRR while considering  patient's activity barriers and safety.   Exercise Prescription Goal: Initial exercise prescription builds to 30-45 minutes a day of aerobic activity, 2-3 days per week.  Home exercise guidelines will be given to patient during program as part of exercise prescription that the participant will acknowledge.   Education: Aerobic Exercise: - Group verbal and visual presentation on the components of exercise prescription. Introduces F.I.T.T principle from ACSM for exercise prescriptions.  Reviews  F.I.T.T. principles of aerobic exercise including progression. Written material given at graduation.   Education: Resistance Exercise: - Group verbal and visual presentation on the components of exercise prescription. Introduces F.I.T.T principle from ACSM for exercise prescriptions  Reviews F.I.T.T. principles of resistance exercise including progression. Written material given at graduation.    Education: Exercise & Equipment Safety: - Individual verbal instruction and demonstration of equipment use and safety with use of the equipment. Flowsheet Row Cardiac Rehab from 12/02/2020 in [facility] Cardiac and Pulmonary Rehab  Date 12/02/20  Educator [facility]  Instruction Review Code 1- Verbalizes Understanding      Education: Exercise Physiology & General Exercise Guidelines: - Group verbal and written instruction with models to review the exercise physiology of the cardiovascular system and associated critical values. Provides general exercise guidelines with specific guidelines to those with heart or lung disease.    Education: Flexibility, Balance, Mind/Body Relaxation: - Group verbal and visual presentation with interactive activity on the components of exercise prescription. Introduces F.I.T.T principle from ACSM for exercise prescriptions. Reviews F.I.T.T. principles of flexibility and balance exercise training including progression. Also discusses the mind body connection.  Reviews various relaxation techniques to help reduce and manage stress (i.e. Deep breathing, progressive muscle relaxation, and visualization). Balance handout provided to take home. Written material given at graduation.   Activity Barriers & Risk Stratification:  Activity Barriers & Cardiac Risk Stratification - 12/02/20 0925      Activity Barriers & Cardiac Risk Stratification   Activity Barriers Other (comment);Deconditioning;Muscular Weakness    Comments groin pain from cath site    Cardiac Risk Stratification Moderate            6 Minute Walk:  6 Minute Walk    Row Name 12/02/20 0925         6 Minute Walk   Phase Initial     Distance 1350 feet     Walk Time 6 minutes     # of Rest Breaks 0     MPH 2.56     METS 4.66     RPE 7     VO2 Peak 16.31     Symptoms No     Resting HR 99 bpm     Resting BP 120/64     Resting Oxygen Saturation  97 %     Exercise Oxygen Saturation  during 6 min walk 97 %     Max Ex. HR 120 bpm     Max Ex. BP 142/80     2 Minute Post BP 122/60            Oxygen Initial Assessment:   Oxygen Re-Evaluation:   Oxygen Discharge (Final Oxygen Re-Evaluation):   Initial Exercise Prescription:  Initial Exercise Prescription - 12/02/20 0900      Date of Initial Exercise RX and Referring Provider   Date 12/02/20    Referring  Provider Marcina Millard MD   Dr. Arnoldo Hooker     Treadmill   MPH 2.5    Grade 2    Minutes 15    METs 3.6      Recumbant Elliptical   Level 3    RPM 50    Minutes 15    METs 3.5      Elliptical   Level 1    Speed 2.5    Minutes 15    METs 3.5      Prescription Details   Frequency (times per week) 3    Duration Progress to 30 minutes of continuous aerobic without signs/symptoms of physical distress      Intensity   THRR 40-80% of Max Heartrate 131-164    Ratings of Perceived Exertion 11-13    Perceived Dyspnea 0-4      Progression   Progression Continue to progress workloads to maintain intensity without signs/symptoms of physical distress.      Resistance Training   Training Prescription Yes    Weight 3 lb    Reps 10-15           Perform Capillary Blood Glucose checks as needed.  Exercise Prescription Changes:  Exercise Prescription Changes    Row Name 12/02/20 0900             Response to Exercise   Blood Pressure (Admit) 120/64       Blood Pressure (Exercise) 142/80       Blood Pressure (Exit) 122/60       Heart Rate (Admit) 99 bpm       Heart Rate (Exercise) 120 bpm       Heart Rate  (Exit) 112 bpm       Oxygen Saturation (Admit) 97 %       Oxygen Saturation (Exercise) 97 %       Rating of Perceived Exertion (Exercise) 7       Symptoms none       Comments walk test results              Exercise Comments:   Exercise Goals and Review:  Exercise Goals    Row Name 12/02/20 0927             Exercise Goals   Increase Physical Activity Yes       Intervention Provide advice, education, support and counseling about physical activity/exercise needs.;Develop an individualized exercise prescription for aerobic and resistive training based on initial evaluation findings, risk stratification, comorbidities and participant's personal goals.       Expected Outcomes Short Term: Attend rehab on a regular basis to increase amount of physical activity.;Long Term: Add in home exercise to make exercise part of routine and to increase amount of physical activity.;Long Term: Exercising regularly at least 3-5 days a week.       Increase Strength and Stamina Yes       Intervention Provide advice, education, support and counseling about physical activity/exercise needs.;Develop an individualized exercise prescription for aerobic and resistive training based on initial evaluation findings, risk stratification, comorbidities and participant's personal goals.       Expected Outcomes Short Term: Increase workloads from initial exercise prescription for resistance, speed, and METs.;Short Term: Perform resistance training exercises routinely during rehab and add in resistance training at home;Long Term: Improve cardiorespiratory fitness, muscular endurance and strength as measured by increased METs and functional capacity ( )       Able to understand and use rate of perceived exertion (  RPE) scale Yes       Intervention Provide education and explanation on how to use RPE scale       Expected Outcomes Short Term: Able to use RPE daily in rehab to express subjective intensity level;Long Term:   Able to use RPE to guide intensity level when exercising independently       Able to understand and use Dyspnea scale Yes       Intervention Provide education and explanation on how to use Dyspnea scale       Expected Outcomes Short Term: Able to use Dyspnea scale daily in rehab to express subjective sense of shortness of breath during exertion;Long Term: Able to use Dyspnea scale to guide intensity level when exercising independently       Knowledge and understanding of Target Heart Rate Range (THRR) Yes       Intervention Provide education and explanation of THRR including how the numbers were predicted and where they are located for reference       Expected Outcomes Short Term: Able to state/look up THRR;Short Term: Able to use daily as guideline for intensity in rehab;Long Term: Able to use THRR to govern intensity when exercising independently       Able to check pulse independently Yes       Intervention Provide education and demonstration on how to check pulse in carotid and radial arteries.;Review the importance of being able to check your own pulse for safety during independent exercise       Expected Outcomes Short Term: Able to explain why pulse checking is important during independent exercise;Long Term: Able to check pulse independently and accurately       Understanding of Exercise Prescription Yes       Intervention Provide education, explanation, and written materials on patient's individual exercise prescription       Expected Outcomes Short Term: Able to explain program exercise prescription;Long Term: Able to explain home exercise prescription to exercise independently              Exercise Goals Re-Evaluation :  Exercise Goals Re-Evaluation    Row Name 12/07/20 1718             Exercise Goal Re-Evaluation   Exercise Goals Review Able to understand and use rate of perceived exertion (RPE) scale;Increase Physical Activity;Knowledge and understanding of Target Heart Rate  Range (THRR);Understanding of Exercise Prescription;Increase Strength and Stamina;Able to check pulse independently       Comments Reviewed RPE and dyspnea scales, THR and program prescription with pt today.  Pt voiced understanding and was given a copy of goals to take home.       Expected Outcomes Short: Use RPE daily to regulate intensity. Long: Follow program prescription in THR.              Discharge Exercise Prescription (Final Exercise Prescription Changes):  Exercise Prescription Changes - 12/02/20 0900      Response to Exercise   Blood Pressure (Admit) 120/64    Blood Pressure (Exercise) 142/80    Blood Pressure (Exit) 122/60    Heart Rate (Admit) 99 bpm    Heart Rate (Exercise) 120 bpm    Heart Rate (Exit) 112 bpm    Oxygen Saturation (Admit) 97 %    Oxygen Saturation (Exercise) 97 %    Rating of Perceived Exertion (Exercise) 7    Symptoms none    Comments walk test results           Nutrition:  Target Goals: Understanding of nutrition guidelines, daily intake of sodium 1500mg , cholesterol 200mg , calories 30% from fat and 7% or less from saturated fats, daily to have 5 or more servings of fruits and vegetables.  Education: All About Nutrition: -Group instruction provided by verbal, written material, interactive activities, discussions, models, and posters to present general guidelines for heart healthy nutrition including fat, fiber, MyPlate, the role of sodium in heart healthy nutrition, utilization of the nutrition label, and utilization of this knowledge for meal planning. Follow up email sent as well. Written material given at graduation. Flowsheet Row Cardiac Rehab from 12/02/2020 in [facility] Cardiac and Pulmonary Rehab  Education need identified 12/02/20      Biometrics:  Pre Biometrics - 12/02/20 0928      Pre Biometrics   Height 6' 5.1" (1.958 m)    Weight 276 lb (125.2 kg)    BMI (Calculated) 32.66    Single Leg Stand 30 seconds             Nutrition Therapy Plan and Nutrition Goals:   Nutrition Assessments:  MEDIFICTS Score Key:  ?70 Need to make dietary changes   40-70 Heart Healthy Diet  ? 40 Therapeutic Level Cholesterol Diet  Flowsheet Row Cardiac Rehab from 12/02/2020 in [facility] Cardiac and Pulmonary Rehab  Picture Your Plate Total Score on Admission 70     Picture Your Plate Scores:  <16 Unhealthy dietary pattern with much room for improvement.  41-50 Dietary pattern unlikely to meet recommendations for good health and room for improvement.  51-60 More healthful dietary pattern, with some room for improvement.   >60 Healthy dietary pattern, although there may be some specific behaviors that could be improved.    Nutrition Goals Re-Evaluation:   Nutrition Goals Discharge (Final Nutrition Goals Re-Evaluation):   Psychosocial: Target Goals: Acknowledge presence or absence of significant depression and/or stress, maximize coping skills, provide positive support system. Participant is able to verbalize types and ability to use techniques and skills needed for reducing stress and depression.   Education: Stress, Anxiety, and Depression - Group verbal and visual presentation to define topics covered.  Reviews how body is impacted by stress, anxiety, and depression.  Also discusses healthy ways to reduce stress and to treat/manage anxiety and depression.  Written material given at graduation.   Education: Sleep Hygiene -Provides group verbal and written instruction about how sleep can affect your health.  Define sleep hygiene, discuss sleep cycles and impact of sleep habits. Review good sleep hygiene tips.    Initial Review & Psychosocial Screening:  Initial Psych Review & Screening - 11/25/20 1041      Initial Review   Current issues with History of Depression      Family Dynamics   Good Support System? Yes      Barriers   Psychosocial barriers to participate in program There are no  identifiable barriers or psychosocial needs.;The patient should benefit from training in stress management and relaxation.      Screening Interventions   Interventions Encouraged to exercise;Provide feedback about the scores to participant;To provide support and resources with identified psychosocial needs    Expected Outcomes Short Term goal: Utilizing psychosocial counselor, staff and physician to assist with identification of specific Stressors or current issues interfering with healing process. Setting desired goal for each stressor or current issue identified.;Long Term Goal: Stressors or current issues are controlled or eliminated.;Short Term goal: Identification and review with participant of any Quality of Life or Depression concerns found by scoring  the questionnaire.;Long Term goal: The participant improves quality of Life and PHQ9 Scores as seen by post scores and/or verbalization of changes           Quality of Life Scores:   Quality of Life - 12/02/20 0928      Quality of Life   Select Quality of Life      Quality of Life Scores   Health/Function Pre 29.2 %    Socioeconomic Pre 27 %    Psych/Spiritual Pre 27.86 %    Family Pre 22.8 %    GLOBAL Pre 27.91 %          Scores of 19 and below usually indicate a poorer quality of life in these areas.  A difference of  2-3 points is a clinically meaningful difference.  A difference of 2-3 points in the total score of the Quality of Life Index has been associated with significant improvement in overall quality of life, self-image, physical symptoms, and general health in studies assessing change in quality of life.  PHQ-9: Recent Review Flowsheet Data    Depression screen [facility] 2/9 12/02/2020 06/19/2020 04/17/2019 06/15/2018 09/28/2017   Decreased Interest 0 0 0 0 0   Down, Depressed, Hopeless 0 0 0 0 0   PHQ - 2 Score 0 0 0 0 0   Altered sleeping 0 - 0 - -   Tired, decreased energy 0 - 0 - -   Change in appetite 0 - 0 - -   Feeling  bad or failure about yourself  0 - 0 - -   Trouble concentrating 0 - 0 - -   Moving slowly or fidgety/restless 0 - 0 - -   Suicidal thoughts 0 - 0 - -   PHQ-9 Score 0 - 0 - -   Difficult doing work/chores Not difficult at all - - - -     Interpretation of Total Score  Total Score Depression Severity:  1-4 = Minimal depression, 5-9 = Mild depression, 10-14 = Moderate depression, 15-19 = Moderately severe depression, 20-27 = Severe depression   Psychosocial Evaluation and Intervention:  Psychosocial Evaluation - 11/25/20 1048      Psychosocial Evaluation & Interventions   Interventions Encouraged to exercise with the program and follow exercise prescription    Comments James [name] reports doing well after his NSTEMI. His only complaint is his groin pain post cath if he over does it. He did state he had a history of depression during his last job a while ago, but is doing much better mentally and phsyically now. He is already back to work which is going smoothly and is "low stress." He is currently trying to work on finding two alternative medications to replace Brillinta and Invokana since they are so expensive. Besides that, he reported no sleep or current stress issues. He is hoping this program will help him stay on track of a heart healthy lifestyle.    Expected Outcomes Short: attend cardiac rehab for education and exercise. Long: develop and maintain positive self care habits.    Continue Psychosocial Services  Follow up required by staff           Psychosocial Re-Evaluation:   Psychosocial Discharge (Final Psychosocial Re-Evaluation):   Vocational Rehabilitation: Provide vocational rehab assistance to qualifying candidates.   Vocational Rehab Evaluation & Intervention:  Vocational Rehab - 11/25/20 1044      Initial Vocational Rehab Evaluation & Intervention   Assessment shows need for Vocational Rehabilitation No  Education: Education Goals: Education classes  will be provided on a variety of topics geared toward better understanding of heart health and risk factor modification. Participant will state understanding/return demonstration of topics presented as noted by education test scores.  Learning Barriers/Preferences:  Learning Barriers/Preferences - 11/25/20 1046      Learning Barriers/Preferences   Learning Barriers None    Learning Preferences None           General Cardiac Education Topics:  AED/CPR: - Group verbal and written instruction with the use of models to demonstrate the basic use of the AED with the basic ABC's of resuscitation.   Anatomy and Cardiac Procedures: - Group verbal and visual presentation and models provide information about basic cardiac anatomy and function. Reviews the testing methods done to diagnose heart disease and the outcomes of the test results. Describes the treatment choices: Medical Management, Angioplasty, or Coronary Bypass Surgery for treating various heart conditions including Myocardial Infarction, Angina, Valve Disease, and Cardiac Arrhythmias.  Written material given at graduation. Flowsheet Row Cardiac Rehab from 12/02/2020 in [facility] Cardiac and Pulmonary Rehab  Education need identified 12/02/20      Medication Safety: - Group verbal and visual instruction to review commonly prescribed medications for heart and lung disease. Reviews the medication, class of the drug, and side effects. Includes the steps to properly store meds and maintain the prescription regimen.  Written material given at graduation.   Intimacy: - Group verbal instruction through game format to discuss how heart and lung disease can affect sexual intimacy. Written material given at graduation..   Know Your Numbers and Heart Failure: - Group verbal and visual instruction to discuss disease risk factors for cardiac and pulmonary disease and treatment options.  Reviews associated critical values for Overweight/Obesity,  Hypertension, Cholesterol, and Diabetes.  Discusses basics of heart failure: signs/symptoms and treatments.  Introduces Heart Failure Zone chart for action plan for heart failure.  Written material given at graduation. Flowsheet Row Cardiac Rehab from 12/02/2020 in [facility] Cardiac and Pulmonary Rehab  Education need identified 12/02/20      Infection Prevention: - Provides verbal and written material to individual with discussion of infection control including proper hand washing and proper equipment cleaning during exercise session. Flowsheet Row Cardiac Rehab from 12/02/2020 in [facility] Cardiac and Pulmonary Rehab  Date 12/02/20  Educator [facility]  Instruction Review Code 1- Verbalizes Understanding      Falls Prevention: - Provides verbal and written material to individual with discussion of falls prevention and safety. Flowsheet Row Cardiac Rehab from 12/02/2020 in [facility] Cardiac and Pulmonary Rehab  Date 12/02/20  Educator [facility]  Instruction Review Code 1- Verbalizes Understanding      Other: -Provides group and verbal instruction on various topics (see comments)   Knowledge Questionnaire Score:  Knowledge Questionnaire Score - 12/02/20 0929      Knowledge Questionnaire Score   Pre Score 23/26 Education Focus: nutrition, heart failure, angina           Core Components/Risk Factors/Patient Goals at Admission:  Personal Goals and Risk Factors at Admission - 12/02/20 0930      Core Components/Risk Factors/Patient Goals on Admission    Weight Management Yes;Obesity;Weight Loss    Intervention Weight Management: Develop a combined nutrition and exercise program designed to reach desired caloric intake, while maintaining appropriate intake of nutrient and fiber, sodium and fats, and appropriate energy expenditure required for the weight goal.;Weight Management: Provide education and appropriate resources to help participant work on and attain  dietary goals.;Weight Management/Obesity: Establish  reasonable short term and long term weight goals.;Obesity: Provide education and appropriate resources to help participant work on and attain dietary goals.    Admit Weight 276 lb (125.2 kg)    Goal Weight: Short Term 270 lb (122.5 kg)    Goal Weight: Long Term 265 lb (120.2 kg)    Expected Outcomes Short Term: Continue to assess and modify interventions until short term weight is achieved;Long Term: Adherence to nutrition and physical activity/exercise program aimed toward attainment of established weight goal;Weight Loss: Understanding of general recommendations for a balanced deficit meal plan, which promotes 1-2 lb weight loss per week and includes a negative energy balance of [phone] kcal/d;Understanding of distribution of calorie intake throughout the day with the consumption of 4-5 meals/snacks;Understanding recommendations for meals to include 15-35% energy as protein, 25-35% energy from fat, 35-60% energy from carbohydrates, less than  of dietary cholesterol, 20-35 gm of total fiber daily    Diabetes Yes    Intervention Provide education about signs/symptoms and action to take for hypo/hyperglycemia.;Provide education about proper nutrition, including hydration, and aerobic/resistive exercise prescription along with prescribed medications to achieve blood glucose in normal ranges: Fasting glucose 65-99 mg/dL    Expected Outcomes Short Term: Participant verbalizes understanding of the signs/symptoms and immediate care of hyper/hypoglycemia, proper foot care and importance of medication, aerobic/resistive exercise and nutrition plan for blood glucose control.;Long Term: Attainment of HbA1C < 7%.    Hypertension Yes    Intervention Provide education on lifestyle modifcations including regular physical activity/exercise, weight management, moderate sodium restriction and increased consumption of fresh fruit, vegetables, and low fat dairy, alcohol moderation, and smoking cessation.;Monitor  prescription use compliance.    Expected Outcomes Short Term: Continued assessment and intervention until BP is < 140/31mm HG in hypertensive participants. < 130/59mm HG in hypertensive participants with diabetes, heart failure or chronic kidney disease.;Long Term: Maintenance of blood pressure at goal levels.    Lipids Yes    Intervention Provide education and support for participant on nutrition & aerobic/resistive exercise along with prescribed medications to achieve LDL 70mg , HDL >40mg .    Expected Outcomes Short Term: Participant states understanding of desired cholesterol values and is compliant with medications prescribed. Participant is following exercise prescription and nutrition guidelines.;Long Term: Cholesterol controlled with medications as prescribed, with individualized exercise RX and with personalized nutrition plan. Value goals: LDL < , HDL > 40 mg.           Education:Diabetes - Individual verbal and written instruction to review signs/symptoms of diabetes, desired ranges of glucose level fasting, after meals and with exercise. Acknowledge that pre and post exercise glucose checks will be done for 3 sessions at entry of program. Flowsheet Row Cardiac Rehab from 12/02/2020 in [facility] Cardiac and Pulmonary Rehab  Date 11/25/20  Educator [facility]  Instruction Review Code 1- Verbalizes Understanding      Core Components/Risk Factors/Patient Goals Review:    Core Components/Risk Factors/Patient Goals at Discharge (Final Review):    ITP Comments:  ITP Comments    Row Name 11/25/20 1055 12/02/20 0924 12/07/20 1718 12/09/20 1130     ITP Comments Initial telephone orientation completed. Diagnosis can be found in [facility] 3/2. EP orientation scheduled for Wednesday 3/16 at 8am. Completed and gym orientation. Initial ITP created and sent for review to Dr. Bethann Punches, Medical Director. First full day of exercise!  Patient was oriented to gym and equipment including functions,  settings, policies, and procedures.  Patient's individual exercise  prescription and treatment plan were reviewed.  All starting workloads were established based on the results of the 6 minute walk test done at initial orientation visit.  The plan for exercise progression was also introduced and progression will be customized based on patient's performance and goals. 30 Day review completed. Medical Director ITP review done, changes made as directed, and signed approval by Medical Director.  New to program           Comments:

## 2020-12-09 NOTE — Progress Notes (Signed)
Daily Session Note  Patient Details  Name: James [name] MRN: [id number] Date of Birth: 04-19-1981 Referring Provider:   Flowsheet Row Cardiac Rehab from 12/02/2020 in [facility] Cardiac and Pulmonary Rehab  Referring Provider [name] MD  [Dr. Serafina Royals      Encounter Date: 12/09/2020  Check In:  Session Check In - 12/09/20 1643      Check-In   Supervising physician immediately available to respond to emergencies See telemetry face sheet for immediately available ER MD    Location [facility]-Cardiac & Pulmonary Rehab    Staff Present Birdie Sons, MPA, RN;Joseph Lou Miner, MS Exercise Physiologist    Virtual Visit No    Medication changes reported     No    Fall or balance concerns reported    No    Warm-up and Cool-down Performed on first and last piece of equipment    Resistance Training Performed Yes    VAD Patient? No    PAD/SET Patient? No      Pain Assessment   Currently in Pain? No/denies              Social History   Tobacco Use  Smoking Status Never Smoker  Smokeless Tobacco Never Used    Goals Met:  Independence with exercise equipment Exercise tolerated well No report of cardiac concerns or symptoms Strength training completed today  Goals Unmet:  Not Applicable  Comments: Pt able to follow exercise prescription today without complaint.  Will continue to monitor for progression.    Dr. Emily Filbert is Medical Director for [place] and [facility].

## 2020-12-10 ENCOUNTER — Other Ambulatory Visit: Payer: Self-pay

## 2020-12-10 ENCOUNTER — Encounter: Payer: PRIVATE HEALTH INSURANCE | Admitting: *Deleted

## 2020-12-10 ENCOUNTER — Other Ambulatory Visit: Payer: Self-pay | Admitting: Family Medicine

## 2020-12-10 DIAGNOSIS — Z955 Presence of coronary angioplasty implant and graft: Secondary | ICD-10-CM

## 2020-12-10 DIAGNOSIS — I214 Non-ST elevation (NSTEMI) myocardial infarction: Secondary | ICD-10-CM | POA: Diagnosis not present

## 2020-12-10 LAB — GLUCOSE, CAPILLARY
Glucose-Capillary: 191 mg/dL — ABNORMAL HIGH (ref 70–99)
Glucose-Capillary: 169 mg/dL — ABNORMAL HIGH (ref 70–99)

## 2020-12-10 NOTE — Progress Notes (Signed)
Daily Session Note  Patient Details  Name: James [name] MRN: [id number] Date of Birth: [birth year] Referring Provider:   Flowsheet Row Cardiac Rehab from 12/02/2020 in [facility] Cardiac and Pulmonary Rehab  Referring Provider [name] MD  [Dr. Serafina Royals      Encounter Date: 12/10/2020  Check In:  Session Check In - 12/10/20 1713      Check-In   Supervising physician immediately available to respond to emergencies See telemetry face sheet for immediately available ER MD    Location [facility]-Cardiac & Pulmonary Rehab    Staff Present Renita Papa, RN BSN;Joseph Lou Miner, [place] Exercise Physiologist    Virtual Visit No    Medication changes reported     No    Fall or balance concerns reported    No    Warm-up and Cool-down Performed on first and last piece of equipment    Resistance Training Performed Yes    VAD Patient? No    PAD/SET Patient? No      Pain Assessment   Currently in Pain? No/denies              Social History   Tobacco Use  Smoking Status Never Smoker  Smokeless Tobacco Never Used    Goals Met:  Independence with exercise equipment Exercise tolerated well No report of cardiac concerns or symptoms Strength training completed today  Goals Unmet:  Not Applicable  Comments: Pt able to follow exercise prescription today without complaint.  Will continue to monitor for progression.    Dr. Emily Filbert is Medical Director for [place] and [facility].

## 2020-12-14 ENCOUNTER — Encounter: Payer: PRIVATE HEALTH INSURANCE | Admitting: *Deleted

## 2020-12-14 ENCOUNTER — Other Ambulatory Visit: Payer: Self-pay

## 2020-12-14 DIAGNOSIS — I214 Non-ST elevation (NSTEMI) myocardial infarction: Secondary | ICD-10-CM

## 2020-12-14 NOTE — Progress Notes (Signed)
Daily Session Note  Patient Details  Name: James [name] MRN: [id number] Date of Birth: 08/15/1981 Referring Provider:   Flowsheet Row Cardiac Rehab from 12/02/2020 in [facility] Cardiac and Pulmonary Rehab  Referring Provider Paraschos, Alexander MD  [Dr. Bruce Kowalski]      Encounter Date: 12/14/2020  Check In:  Session Check In - 12/14/20 1712      Check-In   Supervising physician immediately available to respond to emergencies See telemetry face sheet for immediately available ER MD    Location [facility]-Cardiac & Pulmonary Rehab    Staff Present Meredith Craven, RN BSN;Kelly Hayes, BS, ACSM CEP, Exercise Physiologist;Amanda Sommer, BA, ACSM CEP, Exercise Physiologist    Virtual Visit No    Medication changes reported     No    Fall or balance concerns reported    No    Warm-up and Cool-down Performed on first and last piece of equipment    Resistance Training Performed Yes    VAD Patient? No    PAD/SET Patient? No      Pain Assessment   Currently in Pain? No/denies              Social History   Tobacco Use  Smoking Status Never Smoker  Smokeless Tobacco Never Used    Goals Met:  Independence with exercise equipment Exercise tolerated well No report of cardiac concerns or symptoms Strength training completed today  Goals Unmet:  Not Applicable  Comments: Pt able to follow exercise prescription today without complaint.  Will continue to monitor for progression.    Dr. Mark Miller is Medical Director for [facility] and [facility]. 

## 2020-12-16 ENCOUNTER — Other Ambulatory Visit: Payer: Self-pay

## 2020-12-16 ENCOUNTER — Encounter: Payer: PRIVATE HEALTH INSURANCE | Admitting: *Deleted

## 2020-12-16 DIAGNOSIS — I214 Non-ST elevation (NSTEMI) myocardial infarction: Secondary | ICD-10-CM | POA: Diagnosis not present

## 2020-12-16 DIAGNOSIS — Z955 Presence of coronary angioplasty implant and graft: Secondary | ICD-10-CM

## 2020-12-16 NOTE — Progress Notes (Signed)
Daily Session Note  Patient Details  Name: James [name] MRN: [id number] Date of Birth: March 05, 1981 Referring Provider:   Flowsheet Row Cardiac Rehab from 12/02/2020 in [facility] Cardiac and Pulmonary Rehab  Referring Provider [name] MD  [Dr. Serafina Royals      Encounter Date: 12/16/2020  Check In:  Session Check In - 12/16/20 1612      Check-In   Supervising physician immediately available to respond to emergencies See telemetry face sheet for immediately available ER MD    Location [facility]-Cardiac & Pulmonary Rehab    Staff Present Renita Papa, RN Sherryl Barters, MPA, RN;Melissa Caiola RDN, LDN    Virtual Visit No    Medication changes reported     No    Fall or balance concerns reported    No    Warm-up and Cool-down Performed on first and last piece of equipment    Resistance Training Performed Yes    VAD Patient? No    PAD/SET Patient? No      Pain Assessment   Currently in Pain? No/denies              Social History   Tobacco Use  Smoking Status Never Smoker  Smokeless Tobacco Never Used    Goals Met:  Independence with exercise equipment Exercise tolerated well No report of cardiac concerns or symptoms Strength training completed today  Goals Unmet:  Not Applicable  Comments: Pt able to follow exercise prescription today without complaint.  Will continue to monitor for progression.    Dr. Emily Filbert is Medical Director for [place] and [facility].

## 2020-12-17 ENCOUNTER — Other Ambulatory Visit: Payer: Self-pay

## 2020-12-17 ENCOUNTER — Encounter: Payer: PRIVATE HEALTH INSURANCE | Admitting: *Deleted

## 2020-12-17 DIAGNOSIS — I214 Non-ST elevation (NSTEMI) myocardial infarction: Secondary | ICD-10-CM | POA: Diagnosis not present

## 2020-12-17 DIAGNOSIS — Z955 Presence of coronary angioplasty implant and graft: Secondary | ICD-10-CM

## 2020-12-17 NOTE — Progress Notes (Signed)
Daily Session Note  Patient Details  Name: James [name] MRN: [id number] Date of Birth: 24-Mar-1981 Referring Provider:   Flowsheet Row Cardiac Rehab from 12/02/2020 in [facility] Cardiac and Pulmonary Rehab  Referring Provider [name] MD  [Dr. Serafina Royals      Encounter Date: 12/17/2020  Check In:  Session Check In - 12/17/20 1624      Check-In   Supervising physician immediately available to respond to emergencies See telemetry face sheet for immediately available ER MD    Location [facility]-Cardiac & Pulmonary Rehab    Staff Present Renita Papa, RN BSN;Joseph [address] [place], [place], RCEP, CCRP, CCET    Virtual Visit No    Medication changes reported     No    Fall or balance concerns reported    No    Warm-up and Cool-down Performed on first and last piece of equipment    Resistance Training Performed Yes    VAD Patient? No    PAD/SET Patient? No      Pain Assessment   Currently in Pain? No/denies              Social History   Tobacco Use  Smoking Status Never Smoker  Smokeless Tobacco Never Used    Goals Met:  Independence with exercise equipment Exercise tolerated well No report of cardiac concerns or symptoms Strength training completed today  Goals Unmet:  Not Applicable  Comments: Pt able to follow exercise prescription today without complaint.  Will continue to monitor for progression.  Reviewed home exercise with pt today.  Pt plans to walk and consider joining a gym for exercise.  Reviewed THR, pulse, RPE, sign and symptoms, pulse oximetery and when to call 911 or MD.  Also discussed weather considerations and indoor options.  Pt voiced understanding.   Dr. Emily Filbert is Medical Director for [place] and [facility].

## 2020-12-21 ENCOUNTER — Encounter: Payer: PRIVATE HEALTH INSURANCE | Attending: Cardiology

## 2020-12-21 ENCOUNTER — Telehealth: Payer: Self-pay | Admitting: Family Medicine

## 2020-12-21 ENCOUNTER — Other Ambulatory Visit: Payer: Self-pay

## 2020-12-21 DIAGNOSIS — I214 Non-ST elevation (NSTEMI) myocardial infarction: Secondary | ICD-10-CM | POA: Insufficient documentation

## 2020-12-21 DIAGNOSIS — Z955 Presence of coronary angioplasty implant and graft: Secondary | ICD-10-CM | POA: Diagnosis present

## 2020-12-21 NOTE — Progress Notes (Signed)
Daily Session Note  Patient Details  Name: James [name] MRN: [id number] Date of Birth: 08-Dec-1980 Referring Provider:   Flowsheet Row Cardiac Rehab from 12/02/2020 in [facility] Cardiac and Pulmonary Rehab  Referring Provider [name] MD  [Dr. Serafina Royals      Encounter Date: 12/21/2020  Check In:  Session Check In - 12/21/20 1617      Check-In   Supervising physician immediately available to respond to emergencies See telemetry face sheet for immediately available ER MD    Location [facility]-Cardiac & Pulmonary Rehab    Staff Present Birdie Sons, MPA, Mauricia Area, BS, ACSM CEP, Exercise Physiologist;Kara Eliezer Bottom, MS Exercise Physiologist    Virtual Visit No    Medication changes reported     No    Fall or balance concerns reported    No    Warm-up and Cool-down Performed on first and last piece of equipment    Resistance Training Performed Yes    VAD Patient? No    PAD/SET Patient? No      Pain Assessment   Currently in Pain? No/denies              Social History   Tobacco Use  Smoking Status Never Smoker  Smokeless Tobacco Never Used    Goals Met:  Independence with exercise equipment Exercise tolerated well No report of cardiac concerns or symptoms Strength training completed today  Goals Unmet:  Not Applicable  Comments: Pt able to follow exercise prescription today without complaint.  Will continue to monitor for progression.    Dr. Emily Filbert is Medical Director for [place] and [facility].

## 2020-12-21 NOTE — Telephone Encounter (Signed)
Left vm (detailed per DPR) to reschedule 01/08/21 appt. EM

## 2020-12-23 ENCOUNTER — Other Ambulatory Visit: Payer: Self-pay

## 2020-12-23 DIAGNOSIS — I214 Non-ST elevation (NSTEMI) myocardial infarction: Secondary | ICD-10-CM

## 2020-12-23 DIAGNOSIS — Z955 Presence of coronary angioplasty implant and graft: Secondary | ICD-10-CM

## 2020-12-23 NOTE — Progress Notes (Signed)
Daily Session Note  Patient Details  Name: James [name] MRN: [id number] Date of Birth: Jan 22, 1981 Referring Provider:   Flowsheet Row Cardiac Rehab from 12/02/2020 in [facility] Cardiac and Pulmonary Rehab  Referring Provider James Cowman MD  [Dr. Serafina [name]      Encounter Date: 12/23/2020  Check In:  Session Check In - 12/23/20 1629      Check-In   Supervising physician immediately available to respond to emergencies See telemetry face sheet for immediately available ER MD    Location [facility]-Cardiac & Pulmonary Rehab    Staff Present James [name], MPA, RN;James Lou Miner, MS Exercise Physiologist    Virtual Visit No    Medication changes reported     No    Fall or balance concerns reported    No    Warm-up and Cool-down Performed on first and last piece of equipment    Resistance Training Performed Yes    VAD Patient? No    PAD/SET Patient? No      Pain Assessment   Currently in Pain? No/denies              Social History   Tobacco Use  Smoking Status Never Smoker  Smokeless Tobacco Never Used    Goals Met:  Independence with exercise equipment Exercise tolerated well No report of cardiac concerns or symptoms Strength training completed today  Goals Unmet:  Not Applicable  Comments: Pt able to follow exercise prescription today without complaint.  Will continue to monitor for progression.    Dr. Emily [name] is Medical Director for [place] and [facility].

## 2020-12-24 ENCOUNTER — Other Ambulatory Visit: Payer: Self-pay

## 2020-12-24 ENCOUNTER — Encounter: Payer: PRIVATE HEALTH INSURANCE | Admitting: *Deleted

## 2020-12-24 DIAGNOSIS — Z955 Presence of coronary angioplasty implant and graft: Secondary | ICD-10-CM

## 2020-12-24 DIAGNOSIS — I214 Non-ST elevation (NSTEMI) myocardial infarction: Secondary | ICD-10-CM | POA: Diagnosis not present

## 2020-12-24 NOTE — Progress Notes (Signed)
Daily Session Note  Patient Details  Name: James [name] MRN: [id number] Date of Birth: 1981-08-23 Referring Provider:   Flowsheet Row Cardiac Rehab from 12/02/2020 in [facility] Cardiac and Pulmonary Rehab  Referring Provider [name] MD  [Dr. Serafina Royals      Encounter Date: 12/24/2020  Check In:  Session Check In - 12/24/20 1555      Check-In   Supervising physician immediately available to respond to emergencies See telemetry face sheet for immediately available ER MD    Location [facility]-Cardiac & Pulmonary Rehab    Staff Present Renita Papa, RN BSN;Joseph [address] [place], [place], RCEP, CCRP, CCET    Virtual Visit No    Medication changes reported     No    Fall or balance concerns reported    No    Warm-up and Cool-down Performed on first and last piece of equipment    Resistance Training Performed Yes    VAD Patient? No    PAD/SET Patient? No      Pain Assessment   Currently in Pain? No/denies              Social History   Tobacco Use  Smoking Status Never Smoker  Smokeless Tobacco Never Used    Goals Met:  Independence with exercise equipment Exercise tolerated well No report of cardiac concerns or symptoms Strength training completed today  Goals Unmet:  Not Applicable  Comments: Pt able to follow exercise prescription today without complaint.  Will continue to monitor for progression.    Dr. Emily Filbert is Medical Director for [place] and [facility].

## 2020-12-28 ENCOUNTER — Encounter: Payer: PRIVATE HEALTH INSURANCE | Admitting: *Deleted

## 2020-12-28 ENCOUNTER — Ambulatory Visit: Payer: PRIVATE HEALTH INSURANCE | Admitting: Pulmonary Disease

## 2020-12-28 ENCOUNTER — Other Ambulatory Visit: Payer: Self-pay

## 2020-12-28 DIAGNOSIS — I214 Non-ST elevation (NSTEMI) myocardial infarction: Secondary | ICD-10-CM

## 2020-12-28 DIAGNOSIS — Z955 Presence of coronary angioplasty implant and graft: Secondary | ICD-10-CM

## 2020-12-28 NOTE — Progress Notes (Signed)
Daily Session Note  Patient Details  Name: James [name] MRN: [id number] Date of Birth: [birth year] Referring Provider:   Flowsheet Row Cardiac Rehab from 12/02/2020 in [facility] Cardiac and Pulmonary Rehab  Referring Provider [name] MD  [Dr. Serafina Royals      Encounter Date: 12/28/2020  Check In:  Session Check In - 12/28/20 1727      Check-In   Supervising physician immediately available to respond to emergencies See telemetry face sheet for immediately available ER MD    Location [facility]-Cardiac & Pulmonary Rehab    Staff Present Renita Papa, RN Moises Blood, BS, ACSM CEP, Exercise Physiologist;Kara Eliezer Bottom, MS Exercise Physiologist    Virtual Visit No    Medication changes reported     No    Fall or balance concerns reported    No    Warm-up and Cool-down Performed on first and last piece of equipment    Resistance Training Performed Yes    VAD Patient? No    PAD/SET Patient? No      Pain Assessment   Currently in Pain? No/denies              Social History   Tobacco Use  Smoking Status Never Smoker  Smokeless Tobacco Never Used    Goals Met:  Independence with exercise equipment Exercise tolerated well No report of cardiac concerns or symptoms Strength training completed today  Goals Unmet:  Not Applicable  Comments: Pt able to follow exercise prescription today without complaint.  Will continue to monitor for progression.    Dr. Emily Filbert is Medical Director for [place] and [facility].

## 2020-12-30 ENCOUNTER — Other Ambulatory Visit: Payer: Self-pay

## 2020-12-30 DIAGNOSIS — I214 Non-ST elevation (NSTEMI) myocardial infarction: Secondary | ICD-10-CM

## 2020-12-30 DIAGNOSIS — Z955 Presence of coronary angioplasty implant and graft: Secondary | ICD-10-CM

## 2020-12-30 NOTE — Progress Notes (Signed)
Daily Session Note  Patient Details  Name: James [name] MRN: [id number] Date of Birth: 1981/07/27 Referring Provider:   Flowsheet Row Cardiac Rehab from 12/02/2020 in [facility] Cardiac and Pulmonary Rehab  Referring Provider [name] MD  [Dr. Serafina Royals      Encounter Date: 12/30/2020  Check In:  Session Check In - 12/30/20 1602      Check-In   Supervising physician immediately available to respond to emergencies See telemetry face sheet for immediately available ER MD    Location [facility]-Cardiac & Pulmonary Rehab    Staff Present Birdie Sons, MPA, RN;Joseph Lou Miner, MS Exercise Physiologist    Virtual Visit No    Medication changes reported     No    Fall or balance concerns reported    No    Warm-up and Cool-down Performed on first and last piece of equipment    Resistance Training Performed Yes    VAD Patient? No    PAD/SET Patient? No      Pain Assessment   Currently in Pain? No/denies              Social History   Tobacco Use  Smoking Status Never Smoker  Smokeless Tobacco Never Used    Goals Met:  Independence with exercise equipment Exercise tolerated well No report of cardiac concerns or symptoms Strength training completed today  Goals Unmet:  Not Applicable  Comments: Pt able to follow exercise prescription today without complaint.  Will continue to monitor for progression.    Dr. Emily Filbert is Medical Director for [place] and [facility].

## 2020-12-31 ENCOUNTER — Other Ambulatory Visit: Payer: Self-pay

## 2020-12-31 ENCOUNTER — Encounter: Payer: PRIVATE HEALTH INSURANCE | Admitting: *Deleted

## 2020-12-31 DIAGNOSIS — Z955 Presence of coronary angioplasty implant and graft: Secondary | ICD-10-CM

## 2020-12-31 DIAGNOSIS — I214 Non-ST elevation (NSTEMI) myocardial infarction: Secondary | ICD-10-CM | POA: Diagnosis not present

## 2020-12-31 NOTE — Progress Notes (Signed)
Daily Session Note  Patient Details  Name: Travas Schexnayder MRN: [id number] Date of Birth: 05/10/1981 Referring Provider:   Flowsheet Row Cardiac Rehab from 12/02/2020 in [facility] Cardiac and Pulmonary Rehab  Referring Provider [name] MD  [Dr. Serafina Royals      Encounter Date: 12/31/2020  Check In:  Session Check In - 12/31/20 1600      Check-In   Supervising physician immediately available to respond to emergencies See telemetry face sheet for immediately available ER MD    Location [facility]-Cardiac & Pulmonary Rehab    Staff Present Renita Papa, RN BSN;Joseph [address] [employer], [place], RCEP, CCRP, CCET    Virtual Visit No    Medication changes reported     No    Fall or balance concerns reported    No    Warm-up and Cool-down Performed on first and last piece of equipment    Resistance Training Performed Yes    VAD Patient? No    PAD/SET Patient? No      Pain Assessment   Currently in Pain? No/denies              Social History   Tobacco Use  Smoking Status Never Smoker  Smokeless Tobacco Never Used    Goals Met:  Independence with exercise equipment Exercise tolerated well No report of cardiac concerns or symptoms Strength training completed today  Goals Unmet:  Not Applicable  Comments: Pt able to follow exercise prescription today without complaint.  Will continue to monitor for progression.    Dr. Emily Filbert is Medical Director for [place] and [facility].

## 2021-01-02 ENCOUNTER — Other Ambulatory Visit: Payer: Self-pay | Admitting: Family Medicine

## 2021-01-02 DIAGNOSIS — E1169 Type 2 diabetes mellitus with other specified complication: Secondary | ICD-10-CM

## 2021-01-02 DIAGNOSIS — E785 Hyperlipidemia, unspecified: Secondary | ICD-10-CM

## 2021-01-02 DIAGNOSIS — E1140 Type 2 diabetes mellitus with diabetic neuropathy, unspecified: Secondary | ICD-10-CM

## 2021-01-02 DIAGNOSIS — IMO0002 Reserved for concepts with insufficient information to code with codable children: Secondary | ICD-10-CM

## 2021-01-04 ENCOUNTER — Encounter: Payer: PRIVATE HEALTH INSURANCE | Admitting: *Deleted

## 2021-01-04 ENCOUNTER — Other Ambulatory Visit: Payer: Self-pay

## 2021-01-04 ENCOUNTER — Other Ambulatory Visit: Payer: BC Managed Care – PPO

## 2021-01-04 DIAGNOSIS — Z955 Presence of coronary angioplasty implant and graft: Secondary | ICD-10-CM

## 2021-01-04 DIAGNOSIS — I214 Non-ST elevation (NSTEMI) myocardial infarction: Secondary | ICD-10-CM

## 2021-01-04 NOTE — Progress Notes (Signed)
Daily Session Note  Patient Details  Name: Riyad Keena MRN: [id number] Date of Birth: [birth year] Referring Provider:   Flowsheet Row Cardiac Rehab from 12/02/2020 in [facility] Cardiac and Pulmonary Rehab  Referring Provider [name] MD  [Dr. Serafina Royals      Encounter Date: 01/04/2021  Check In:  Session Check In - 01/04/21 1621      Check-In   Supervising physician immediately available to respond to emergencies See telemetry face sheet for immediately available ER MD    Location [facility]-Cardiac & Pulmonary Rehab    Staff Present Renita Papa, RN Moises Blood, BS, ACSM CEP, Exercise Physiologist;Melissa Caiola RDN, LDN;Criston Chancellor, RN, BSN, CCRP    Virtual Visit No    Medication changes reported     No    Fall or balance concerns reported    No    Warm-up and Cool-down Performed on first and last piece of equipment    Resistance Training Performed Yes    VAD Patient? No    PAD/SET Patient? No      Pain Assessment   Currently in Pain? No/denies              Social History   Tobacco Use  Smoking Status Never Smoker  Smokeless Tobacco Never Used    Goals Met:  Independence with exercise equipment Exercise tolerated well No report of cardiac concerns or symptoms  Goals Unmet:  Not Applicable  Comments: Pt able to follow exercise prescription today without complaint.  Will continue to monitor for progression.    Dr. Emily Filbert is Medical Director for [place] and [facility].

## 2021-01-06 ENCOUNTER — Encounter: Payer: Self-pay | Admitting: *Deleted

## 2021-01-06 ENCOUNTER — Encounter: Payer: PRIVATE HEALTH INSURANCE | Admitting: *Deleted

## 2021-01-06 ENCOUNTER — Other Ambulatory Visit: Payer: Self-pay

## 2021-01-06 DIAGNOSIS — I214 Non-ST elevation (NSTEMI) myocardial infarction: Secondary | ICD-10-CM

## 2021-01-06 DIAGNOSIS — Z955 Presence of coronary angioplasty implant and graft: Secondary | ICD-10-CM

## 2021-01-06 NOTE — Progress Notes (Signed)
Cardiac Individual Treatment Plan  Patient Details  Name: James [name] MRN: [id number] Date of Birth: October 01, 1980 Referring Provider:   Flowsheet Row Cardiac Rehab from 12/02/2020 in [facility] Cardiac and Pulmonary Rehab  Referring Provider Paraschos, Alexander MD  [Dr. Serafina Royals      Initial Encounter Date:  Flowsheet Row Cardiac Rehab from 12/02/2020 in [facility] Cardiac and Pulmonary Rehab  Date 12/02/20      Visit Diagnosis: NSTEMI (non-ST elevated myocardial infarction) [facility])  Status post coronary artery stent placement  Patient's Home Medications on Admission:  Current Outpatient Medications:  .  aspirin EC 81 MG EC tablet, Take 1 tablet (81 mg total) by mouth daily. Swallow whole., Disp: 30 tablet, Rfl: 11 .  atorvastatin (LIPITOR) 80 MG tablet, Take 1 tablet (80 mg total) by mouth at bedtime., Disp: 30 tablet, Rfl: 1 .  citalopram (CELEXA) 20 MG tablet, TAKE ONE TABLET BY MOUTH DAILY, Disp: 90 tablet, Rfl: 1 .  clopidogrel (PLAVIX) 75 MG tablet, Take 1 tablet (75 mg total) by mouth daily., Disp: 90 tablet, Rfl: 1 .  glimepiride (AMARYL) 2 MG tablet, Take 1 tablet (2 mg total) by mouth daily with breakfast. Take 1 tablet by mouth once daily with breakfast, Disp: 30 tablet, Rfl: 1 .  insulin glargine (LANTUS SOLOSTAR) 100 UNIT/ML Solostar Pen, Inject 10 Units into the skin daily., Disp: 15 mL, Rfl: 1 .  lisinopril (ZESTRIL) 5 MG tablet, Take 1 tablet (5 mg total) by mouth daily., Disp: 30 tablet, Rfl: 1 .  metFORMIN (GLUCOPHAGE) 1000 MG tablet, TAKE 1 TABLET BY MOUTH TWICE DAILY WITH MEALS (Patient taking differently: Take 1,000 mg by mouth 2 (two) times daily with a meal.), Disp: 180 tablet, Rfl: 3 .  metoprolol succinate (TOPROL XL) 25 MG 24 hr tablet, Take 1.5 tablets (37.5 mg total) by mouth daily., Disp: 45 tablet, Rfl: 6 .  Multiple Vitamin (MULTIVITAMIN ADULT) TABS, Take 1 tablet by mouth daily. Nature's nutrition Blood sugar support - with cinnamon, chromium, and ALA,  Disp: , Rfl:  .  Omega-3 Fatty Acids (FISH OIL) 1200 MG CAPS, Take 1 capsule by mouth in the morning and at bedtime., Disp: , Rfl:  .  vitamin B-12 (V-R VITAMIN B-12) 500 MCG tablet, Take 1 tablet (500 mcg total) by mouth daily., Disp: , Rfl:   Past Medical History: Past Medical History:  Diagnosis Date  . Diabetes type 2, controlled ([place])   . History of asthma childhood   dust mites  . History of pneumonia 09/2014   LLL  . HTN (hypertension)   . Major depressive disorder, recurrent episode, moderate ([place]) 12/26/2015  . Mixed hyperlipidemia   . NAFLD (nonalcoholic fatty liver disease)   . Obesity   . Suicidal thoughts 12/25/2015   s/p ER evaluation    Tobacco Use: Social History   Tobacco Use  Smoking Status Never Smoker  Smokeless Tobacco Never Used    Labs: Recent Review Flowsheet Data    Labs for ITP Cardiac and Pulmonary Rehab Latest Ref Rng & Units 04/17/2019 07/29/2019 11/26/2019 02/28/2020 06/09/2020   Cholestrol 0 - 200 mg/dL 161 - - - 149   LDLCALC 0 - 99 mg/dL - - - - -   LDLDIRECT mg/dL 96.0 - - - 76.0   HDL >39.00 mg/dL 28.60(L) - - - 26.60(L)   Trlycerides 0.0 - 149.0 mg/dL 272.0(H) - - - 351.0(H)   Hemoglobin A1c 4.6 - 6.5 % 8.6(A) 7.7(A) 9.0(A) 8.0(A) 7.2(H)       Exercise Target  Goals: Exercise Program Goal: Individual exercise prescription set using results from initial 6 min walk test and THRR while considering  patient's activity barriers and safety.   Exercise Prescription Goal: Initial exercise prescription builds to 30-45 minutes a day of aerobic activity, 2-3 days per week.  Home exercise guidelines will be given to patient during program as part of exercise prescription that the participant will acknowledge.   Education: Aerobic Exercise: - Group verbal and visual presentation on the components of exercise prescription. Introduces F.I.T.T principle from ACSM for exercise prescriptions.  Reviews F.I.T.T. principles of aerobic exercise including  progression. Written material given at graduation.   Education: Resistance Exercise: - Group verbal and visual presentation on the components of exercise prescription. Introduces F.I.T.T principle from ACSM for exercise prescriptions  Reviews F.I.T.T. principles of resistance exercise including progression. Written material given at graduation.    Education: Exercise & Equipment Safety: - Individual verbal instruction and demonstration of equipment use and safety with use of the equipment. Flowsheet Row Cardiac Rehab from 12/30/2020 in [facility] Cardiac and Pulmonary Rehab  Date 12/02/20  Educator [facility]  Instruction Review Code 1- Verbalizes Understanding      Education: Exercise Physiology & General Exercise Guidelines: - Group verbal and written instruction with models to review the exercise physiology of the cardiovascular system and associated critical values. Provides general exercise guidelines with specific guidelines to those with heart or lung disease.  Flowsheet Row Cardiac Rehab from 12/30/2020 in [facility] Cardiac and Pulmonary Rehab  Date 12/30/20  Educator AS  Instruction Review Code 1- Verbalizes Understanding      Education: Flexibility, Balance, Mind/Body Relaxation: - Group verbal and visual presentation with interactive activity on the components of exercise prescription. Introduces F.I.T.T principle from ACSM for exercise prescriptions. Reviews F.I.T.T. principles of flexibility and balance exercise training including progression. Also discusses the mind body connection.  Reviews various relaxation techniques to help reduce and manage stress (i.e. Deep breathing, progressive muscle relaxation, and visualization). Balance handout provided to take home. Written material given at graduation.   Activity Barriers & Risk Stratification:  Activity Barriers & Cardiac Risk Stratification - 12/02/20 0925      Activity Barriers & Cardiac Risk Stratification   Activity Barriers Other  (comment);Deconditioning;Muscular Weakness    Comments groin pain from cath site    Cardiac Risk Stratification Moderate           6 Minute Walk:  6 Minute Walk    Row Name 12/02/20 0925         6 Minute Walk   Phase Initial     Distance 1350 feet     Walk Time 6 minutes     # of Rest Breaks 0     MPH 2.56     METS 4.66     RPE 7     VO2 Peak 16.31     Symptoms No     Resting HR 99 bpm     Resting BP 120/64     Resting Oxygen Saturation  97 %     Exercise Oxygen Saturation  during 6 min walk 97 %     Max Ex. HR 120 bpm     Max Ex. BP 142/80     2 Minute Post BP 122/60            Oxygen Initial Assessment:   Oxygen Re-Evaluation:   Oxygen Discharge (Final Oxygen Re-Evaluation):   Initial Exercise Prescription:  Initial Exercise Prescription - 12/02/20 0900  Date of Initial Exercise RX and Referring Provider   Date 12/02/20    Referring Provider Paraschos, Alexander MD   Dr. Serafina Royals     Treadmill   MPH 2.5    Grade 2    Minutes 15    METs 3.6      Recumbant Elliptical   Level 3    RPM 50    Minutes 15    METs 3.5      Elliptical   Level 1    Speed 2.5    Minutes 15    METs 3.5      Prescription Details   Frequency (times per week) 3    Duration Progress to 30 minutes of continuous aerobic without signs/symptoms of physical distress      Intensity   THRR 40-80% of Max Heartrate 131-164    Ratings of Perceived Exertion 11-13    Perceived Dyspnea 0-4      Progression   Progression Continue to progress workloads to maintain intensity without signs/symptoms of physical distress.      Resistance Training   Training Prescription Yes    Weight 3 lb    Reps 10-15           Perform Capillary Blood Glucose checks as needed.  Exercise Prescription Changes:  Exercise Prescription Changes    Row Name 12/02/20 0900 12/15/20 1300 12/28/20 1400         Response to Exercise   Blood Pressure (Admit) 120/64 122/64 122/68      Blood Pressure (Exercise) 142/80 164/66 154/76     Blood Pressure (Exit) 122/60 124/62 98/64     Heart Rate (Admit) 99 bpm 108 bpm 106 bpm     Heart Rate (Exercise) 120 bpm 138 bpm 148 bpm     Heart Rate (Exit) 112 bpm 115 bpm 77 bpm     Oxygen Saturation (Admit) 97 % -- --     Oxygen Saturation (Exercise) 97 % -- --     Rating of Perceived Exertion (Exercise) 7 13 15      Symptoms none none none     Comments walk test results -- --     Duration -- Continue with 30 min of aerobic exercise without signs/symptoms of physical distress. Continue with 30 min of aerobic exercise without signs/symptoms of physical distress.     Intensity -- THRR unchanged THRR unchanged           Progression   Progression -- Continue to progress workloads to maintain intensity without signs/symptoms of physical distress. Continue to progress workloads to maintain intensity without signs/symptoms of physical distress.     Average METs -- 4.65 6.15           Resistance Training   Training Prescription -- Yes Yes     Weight -- 8lb 12 lb     Reps -- 10-15 10-15           Interval Training   Interval Training -- -- No           Treadmill   MPH -- 3.5 4     Grade -- 2 7.5     Minutes -- 15 15     METs -- 4.65 9           Elliptical   Level -- 1 11     Speed -- -- 3.8     Minutes -- 15 15           REL-XR   Level -- --  12     Minutes -- -- 15     METs -- -- 3.3           Home Exercise Plan   Plans to continue exercise at -- -- Home (comment)  walking     Frequency -- -- Add 2 additional days to program exercise sessions.     Initial Home Exercises Provided -- -- 12/17/20            Exercise Comments:   Exercise Goals and Review:  Exercise Goals    Row Name 12/02/20 6546             Exercise Goals   Increase Physical Activity Yes       Intervention Provide advice, education, support and counseling about physical activity/exercise needs.;Develop an individualized exercise  prescription for aerobic and resistive training based on initial evaluation findings, risk stratification, comorbidities and participant's personal goals.       Expected Outcomes Short Term: Attend rehab on a regular basis to increase amount of physical activity.;Long Term: Add in home exercise to make exercise part of routine and to increase amount of physical activity.;Long Term: Exercising regularly at least 3-5 days a week.       Increase Strength and Stamina Yes       Intervention Provide advice, education, support and counseling about physical activity/exercise needs.;Develop an individualized exercise prescription for aerobic and resistive training based on initial evaluation findings, risk stratification, comorbidities and participant's personal goals.       Expected Outcomes Short Term: Increase workloads from initial exercise prescription for resistance, speed, and METs.;Short Term: Perform resistance training exercises routinely during rehab and add in resistance training at home;Long Term: Improve cardiorespiratory fitness, muscular endurance and strength as measured by increased METs and functional capacity (6MWT)       Able to understand and use rate of perceived exertion (RPE) scale Yes       Intervention Provide education and explanation on how to use RPE scale       Expected Outcomes Short Term: Able to use RPE daily in rehab to express subjective intensity level;Long Term:  Able to use RPE to guide intensity level when exercising independently       Able to understand and use Dyspnea scale Yes       Intervention Provide education and explanation on how to use Dyspnea scale       Expected Outcomes Short Term: Able to use Dyspnea scale daily in rehab to express subjective sense of shortness of breath during exertion;Long Term: Able to use Dyspnea scale to guide intensity level when exercising independently       Knowledge and understanding of Target Heart Rate Range (THRR) Yes        Intervention Provide education and explanation of THRR including how the numbers were predicted and where they are located for reference       Expected Outcomes Short Term: Able to state/look up THRR;Short Term: Able to use daily as guideline for intensity in rehab;Long Term: Able to use THRR to govern intensity when exercising independently       Able to check pulse independently Yes       Intervention Provide education and demonstration on how to check pulse in carotid and radial arteries.;Review the importance of being able to check your own pulse for safety during independent exercise       Expected Outcomes Short Term: Able to explain why pulse checking is important during independent exercise;Long Term:  Able to check pulse independently and accurately       Understanding of Exercise Prescription Yes       Intervention Provide education, explanation, and written materials on patient's individual exercise prescription       Expected Outcomes Short Term: Able to explain program exercise prescription;Long Term: Able to explain home exercise prescription to exercise independently              Exercise Goals Re-Evaluation :  Exercise Goals Re-Evaluation    Row Name 12/07/20 1718 12/15/20 1324 12/17/20 1637 12/24/20 1644 12/28/20 1449     Exercise Goal Re-Evaluation   Exercise Goals Review Able to understand and use rate of perceived exertion (RPE) scale;Increase Physical Activity;Knowledge and understanding of Target Heart Rate Range (THRR);Understanding of Exercise Prescription;Increase Strength and Stamina;Able to check pulse independently Increase Physical Activity;Increase Strength and Stamina Increase Physical Activity;Able to understand and use rate of perceived exertion (RPE) scale;Increase Strength and Stamina Increase Physical Activity;Able to understand and use rate of perceived exertion (RPE) scale;Increase Strength and Stamina Increase Physical Activity;Increase Strength and  Stamina;Understanding of Exercise Prescription   Comments Reviewed RPE and dyspnea scales, THR and program prescription with pt today.  Pt voiced understanding and was given a copy of goals to take home. Rosser is progressing well with exercise.  He works in [employer] range and has attended consistently so far.  Staff willmonitor progress. Reviewed home exercise with pt today.  Pt plans to walk and consider joining a gym for exercise.  Reviewed THR, pulse, RPE, sign and symptoms, pulse oximetery and when to call 911 or MD.  Also discussed weather considerations and indoor options.  Pt voiced understanding. Re-reviewed home exercise precautions and protocols with patient. He has not done any exercise at home yet and was encouraged to do so. Talked about gradual progression and monitor HR and symptoms. Deondray is doing well in rehab. He has switched class to come in ealier to help with his work schedule.  He is up to 4 mph at 7.5% grade and doing well.  We will continue to montior his progress and talk about intervals   Expected Outcomes Short: Use RPE daily to regulate intensity. Long: Follow program prescription in THR. Short:  continue to attend consistently Long:  improve overall MET level Short: start to monitor HR when exercising at home Long:  maintain exercise independently Short: start to monitor HR when exercising at home Long:  maintain exercise independently Short: Talk about interval training Long: Continue to improve stamina          Discharge Exercise Prescription (Final Exercise Prescription Changes):  Exercise Prescription Changes - 12/28/20 1400      Response to Exercise   Blood Pressure (Admit) 122/68    Blood Pressure (Exercise) 154/76    Blood Pressure (Exit) 98/64    Heart Rate (Admit) 106 bpm    Heart Rate (Exercise) 148 bpm    Heart Rate (Exit) 77 bpm    Rating of Perceived Exertion (Exercise) 15    Symptoms none    Duration Continue with 30 min of aerobic exercise without  signs/symptoms of physical distress.    Intensity THRR unchanged      Progression   Progression Continue to progress workloads to maintain intensity without signs/symptoms of physical distress.    Average METs 6.15      Resistance Training   Training Prescription Yes    Weight 12 lb    Reps 10-15      Interval Training  Interval Training No      Treadmill   MPH 4    Grade 7.5    Minutes 15    METs 9      Elliptical   Level 11    Speed 3.8    Minutes 15      REL-XR   Level 12    Minutes 15    METs 3.3      Home Exercise Plan   Plans to continue exercise at Home (comment)   walking   Frequency Add 2 additional days to program exercise sessions.    Initial Home Exercises Provided 12/17/20           Nutrition:  Target Goals: Understanding of nutrition guidelines, daily intake of sodium '1500mg'$ , cholesterol '200mg'$ , calories 30% from fat and 7% or less from saturated fats, daily to have 5 or more servings of fruits and vegetables.  Education: All About Nutrition: -Group instruction provided by verbal, written material, interactive activities, discussions, models, and posters to present general guidelines for heart healthy nutrition including fat, fiber, MyPlate, the role of sodium in heart healthy nutrition, utilization of the nutrition label, and utilization of this knowledge for meal planning. Follow up email sent as well. Written material given at graduation. Flowsheet Row Cardiac Rehab from 12/30/2020 in [facility] Cardiac and Pulmonary Rehab  Education need identified 12/02/20      Biometrics:  Pre Biometrics - 12/02/20 0928      Pre Biometrics   Height 6' 5.1" (1.958 m)    Weight 276 lb (125.2 kg)    BMI (Calculated) 32.66    Single Leg Stand 30 seconds            Nutrition Therapy Plan and Nutrition Goals:   Nutrition Assessments:  MEDIFICTS Score Key:  ?70 Need to make dietary changes   40-70 Heart Healthy Diet  ? 40 Therapeutic Level Cholesterol  Diet  Flowsheet Row Cardiac Rehab from 12/02/2020 in [facility] Cardiac and Pulmonary Rehab  Picture Your Plate Total Score on Admission 70     Picture Your Plate Scores:  <16 Unhealthy dietary pattern with much room for improvement.  41-50 Dietary pattern unlikely to meet recommendations for good health and room for improvement.  51-60 More healthful dietary pattern, with some room for improvement.   >60 Healthy dietary pattern, although there may be some specific behaviors that could be improved.    Nutrition Goals Re-Evaluation:  Nutrition Goals Re-Evaluation    Row Name 12/24/20 1615             Goals   Comment Aydian has not yet met with the RD to establish goals.       Expected Outcome Short: Meet with RD Long: Maintain heart healthy diet              Nutrition Goals Discharge (Final Nutrition Goals Re-Evaluation):  Nutrition Goals Re-Evaluation - 12/24/20 1615      Goals   Comment Larnie has not yet met with the RD to establish goals.    Expected Outcome Short: Meet with RD Long: Maintain heart healthy diet           Psychosocial: Target Goals: Acknowledge presence or absence of significant depression and/or stress, maximize coping skills, provide positive support system. Participant is able to verbalize types and ability to use techniques and skills needed for reducing stress and depression.   Education: Stress, Anxiety, and Depression - Group verbal and visual presentation to define topics covered.  Reviews how  body is impacted by stress, anxiety, and depression.  Also discusses healthy ways to reduce stress and to treat/manage anxiety and depression.  Written material given at graduation. Flowsheet Row Cardiac Rehab from 12/30/2020 in [facility] Cardiac and Pulmonary Rehab  Date 12/23/20  Educator AS  Instruction Review Code 1- Verbalizes Understanding      Education: Sleep Hygiene -Provides group verbal and written instruction about how sleep can affect your  health.  Define sleep hygiene, discuss sleep cycles and impact of sleep habits. Review good sleep hygiene tips.    Initial Review & Psychosocial Screening:  Initial Psych Review & Screening - 11/25/20 1041      Initial Review   Current issues with History of Depression      Family Dynamics   Good Support System? Yes      Barriers   Psychosocial barriers to participate in program There are no identifiable barriers or psychosocial needs.;The patient should benefit from training in stress management and relaxation.      Screening Interventions   Interventions Encouraged to exercise;Provide feedback about the scores to participant;To provide support and resources with identified psychosocial needs    Expected Outcomes Short Term goal: Utilizing psychosocial counselor, staff and physician to assist with identification of specific Stressors or current issues interfering with healing process. Setting desired goal for each stressor or current issue identified.;Long Term Goal: Stressors or current issues are controlled or eliminated.;Short Term goal: Identification and review with participant of any Quality of Life or Depression concerns found by scoring the questionnaire.;Long Term goal: The participant improves quality of Life and PHQ9 Scores as seen by post scores and/or verbalization of changes           Quality of Life Scores:   Quality of Life - 12/02/20 0928      Quality of Life   Select Quality of Life      Quality of Life Scores   Health/Function Pre 29.2 %    Socioeconomic Pre 27 %    Psych/Spiritual Pre 27.86 %    Family Pre 22.8 %    GLOBAL Pre 27.91 %          Scores of 19 and below usually indicate a poorer quality of life in these areas.  A difference of  2-3 points is a clinically meaningful difference.  A difference of 2-3 points in the total score of the Quality of Life Index has been associated with significant improvement in overall quality of life, self-image,  physical symptoms, and general health in studies assessing change in quality of life.  PHQ-9: Recent Review Flowsheet Data    Depression screen [facility] 2/9 12/02/2020 06/19/2020 04/17/2019 06/15/2018 09/28/2017   Decreased Interest 0 0 0 0 0   Down, Depressed, Hopeless 0 0 0 0 0   PHQ - 2 Score 0 0 0 0 0   Altered sleeping 0 - 0 - -   Tired, decreased energy 0 - 0 - -   Change in appetite 0 - 0 - -   Feeling bad or failure about yourself  0 - 0 - -   Trouble concentrating 0 - 0 - -   Moving slowly or fidgety/restless 0 - 0 - -   Suicidal thoughts 0 - 0 - -   PHQ-9 Score 0 - 0 - -   Difficult doing work/chores Not difficult at all - - - -     Interpretation of Total Score  Total Score Depression Severity:  1-4 = Minimal  depression, 5-9 = Mild depression, 10-14 = Moderate depression, 15-19 = Moderately severe depression, 20-27 = Severe depression   Psychosocial Evaluation and Intervention:  Psychosocial Evaluation - 11/25/20 1048      Psychosocial Evaluation & Interventions   Interventions Encouraged to exercise with the program and follow exercise prescription    Comments Saketh reports doing well after his NSTEMI. His only complaint is his groin pain post cath if he over does it. He did state he had a history of depression during his last job a while ago, but is doing much better mentally and phsyically now. He is already back to work which is going smoothly and is "low stress." He is currently trying to work on finding two alternative medications to replace Gaines since they are so expensive. Besides that, he reported no sleep or current stress issues. He is hoping this program will help him stay on track of a heart healthy lifestyle.    Expected Outcomes Short: attend cardiac rehab for education and exercise. Long: develop and maintain positive self care habits.    Continue Psychosocial Services  Follow up required by staff           Psychosocial Re-Evaluation:   Psychosocial Re-Evaluation    [place] Name 12/24/20 1625             Psychosocial Re-Evaluation   Current issues with Current Sleep Concerns;Current Stress Concerns       Comments Lars is holding up well mentally. He stated he has family stress as well as financial stress, though declined to go into detail. He is at least enjoying coming to rehab. Stressed importance about home exercise and utilizing exercise to help with stress management. He is on Citalopram and feels like it is working well. Taking prn medication for anxiety which he hasn't needed.  Doesn't sleep the best- amount of hours of sleep vary every night . Doctor wants to do sleep study, but he did not want to proceed any further after talking about it. Talking to doctor later this month about a possible CPAP, though he is not too happy about the idea. Most if the reason is due to insurance reasons. Told him to discuss with his doctor about other alternatives for programs that can help financially. He has an appointment to talk to doctor about it later this month.       Expected Outcomes Short: Talk to doctor about possible CPAP/sleep study Long: Continue to use exercise for stress management and maintain positive attitude       Interventions Encouraged to attend Cardiac Rehabilitation for the exercise       Continue Psychosocial Services  Follow up required by staff              Psychosocial Discharge (Final Psychosocial Re-Evaluation):  Psychosocial Re-Evaluation - 12/24/20 1625      Psychosocial Re-Evaluation   Current issues with Current Sleep Concerns;Current Stress Concerns    Comments Alex is holding up well mentally. He stated he has family stress as well as financial stress, though declined to go into detail. He is at least enjoying coming to rehab. Stressed importance about home exercise and utilizing exercise to help with stress management. He is on Citalopram and feels like it is working well. Taking prn medication  for anxiety which he hasn't needed.  Doesn't sleep the best- amount of hours of sleep vary every night . Doctor wants to do sleep study, but he did not want to proceed any  further after talking about it. Talking to doctor later this month about a possible CPAP, though he is not too happy about the idea. Most if the reason is due to insurance reasons. Told him to discuss with his doctor about other alternatives for programs that can help financially. He has an appointment to talk to doctor about it later this month.    Expected Outcomes Short: Talk to doctor about possible CPAP/sleep study Long: Continue to use exercise for stress management and maintain positive attitude    Interventions Encouraged to attend Cardiac Rehabilitation for the exercise    Continue Psychosocial Services  Follow up required by staff           Vocational Rehabilitation: Provide vocational rehab assistance to qualifying candidates.   Vocational Rehab Evaluation & Intervention:  Vocational Rehab - 11/25/20 1044      Initial Vocational Rehab Evaluation & Intervention   Assessment shows need for Vocational Rehabilitation No           Education: Education Goals: Education classes will be provided on a variety of topics geared toward better understanding of heart health and risk factor modification. Participant will state understanding/return demonstration of topics presented as noted by education test scores.  Learning Barriers/Preferences:  Learning Barriers/Preferences - 11/25/20 1046      Learning Barriers/Preferences   Learning Barriers None    Learning Preferences None           General Cardiac Education Topics:  AED/CPR: - Group verbal and written instruction with the use of models to demonstrate the basic use of the AED with the basic ABC's of resuscitation.   Anatomy and Cardiac Procedures: - Group verbal and visual presentation and models provide information about basic cardiac anatomy and  function. Reviews the testing methods done to diagnose heart disease and the outcomes of the test results. Describes the treatment choices: Medical Management, Angioplasty, or Coronary Bypass Surgery for treating various heart conditions including Myocardial Infarction, Angina, Valve Disease, and Cardiac Arrhythmias.  Written material given at graduation. Flowsheet Row Cardiac Rehab from 12/30/2020 in [facility] Cardiac and Pulmonary Rehab  Education need identified 12/02/20      Medication Safety: - Group verbal and visual instruction to review commonly prescribed medications for heart and lung disease. Reviews the medication, class of the drug, and side effects. Includes the steps to properly store meds and maintain the prescription regimen.  Written material given at graduation.   Intimacy: - Group verbal instruction through game format to discuss how heart and lung disease can affect sexual intimacy. Written material given at graduation..   Know Your Numbers and Heart Failure: - Group verbal and visual instruction to discuss disease risk factors for cardiac and pulmonary disease and treatment options.  Reviews associated critical values for Overweight/Obesity, Hypertension, Cholesterol, and Diabetes.  Discusses basics of heart failure: signs/symptoms and treatments.  Introduces Heart Failure Zone chart for action plan for heart failure.  Written material given at graduation. Flowsheet Row Cardiac Rehab from 12/30/2020 in [facility] Cardiac and Pulmonary Rehab  Education need identified 12/02/20  Date 12/09/20  Educator [facility]  Instruction Review Code 1- Verbalizes Understanding      Infection Prevention: - Provides verbal and written material to individual with discussion of infection control including proper hand washing and proper equipment cleaning during exercise session. Flowsheet Row Cardiac Rehab from 12/30/2020 in [facility] Cardiac and Pulmonary Rehab  Date 12/02/20  Educator [facility]  Instruction Review  Code 1- Verbalizes Understanding  Falls Prevention: - Provides verbal and written material to individual with discussion of falls prevention and safety. Flowsheet Row Cardiac Rehab from 12/30/2020 in [facility] Cardiac and Pulmonary Rehab  Date 12/02/20  Educator [facility]  Instruction Review Code 1- Verbalizes Understanding      Other: -Provides group and verbal instruction on various topics (see comments)   Knowledge Questionnaire Score:  Knowledge Questionnaire Score - 12/02/20 0929      Knowledge Questionnaire Score   Pre Score 23/26 Education Focus: nutrition, heart failure, angina           Core Components/Risk Factors/Patient Goals at Admission:  Personal Goals and Risk Factors at Admission - 12/02/20 0930      Core Components/Risk Factors/Patient Goals on Admission    Weight Management Yes;Obesity;Weight Loss    Intervention Weight Management: Develop a combined nutrition and exercise program designed to reach desired caloric intake, while maintaining appropriate intake of nutrient and fiber, sodium and fats, and appropriate energy expenditure required for the weight goal.;Weight Management: Provide education and appropriate resources to help participant work on and attain dietary goals.;Weight Management/Obesity: Establish reasonable short term and long term weight goals.;Obesity: Provide education and appropriate resources to help participant work on and attain dietary goals.    Admit Weight 276 lb (125.2 kg)    Goal Weight: Short Term 270 lb (122.5 kg)    Goal Weight: Long Term 265 lb (120.2 kg)    Expected Outcomes Short Term: Continue to assess and modify interventions until short term weight is achieved;Long Term: Adherence to nutrition and physical activity/exercise program aimed toward attainment of established weight goal;Weight Loss: Understanding of general recommendations for a balanced deficit meal plan, which promotes 1-2 lb weight loss per week and includes a negative  energy balance of [phone] kcal/d;Understanding of distribution of calorie intake throughout the day with the consumption of 4-5 meals/snacks;Understanding recommendations for meals to include 15-35% energy as protein, 25-35% energy from fat, 35-60% energy from carbohydrates, less than $RemoveB'200mg'CfAGVawD$  of dietary cholesterol, 20-35 gm of total fiber daily    Diabetes Yes    Intervention Provide education about signs/symptoms and action to take for hypo/hyperglycemia.;Provide education about proper nutrition, including hydration, and aerobic/resistive exercise prescription along with prescribed medications to achieve blood glucose in normal ranges: Fasting glucose 65-99 mg/dL    Expected Outcomes Short Term: Participant verbalizes understanding of the signs/symptoms and immediate care of hyper/hypoglycemia, proper foot care and importance of medication, aerobic/resistive exercise and nutrition plan for blood glucose control.;Long Term: Attainment of HbA1C < 7%.    Hypertension Yes    Intervention Provide education on lifestyle modifcations including regular physical activity/exercise, weight management, moderate sodium restriction and increased consumption of fresh fruit, vegetables, and low fat dairy, alcohol moderation, and smoking cessation.;Monitor prescription use compliance.    Expected Outcomes Short Term: Continued assessment and intervention until BP is < 140/39mm HG in hypertensive participants. < 130/36mm HG in hypertensive participants with diabetes, heart failure or chronic kidney disease.;Long Term: Maintenance of blood pressure at goal levels.    Lipids Yes    Intervention Provide education and support for participant on nutrition & aerobic/resistive exercise along with prescribed medications to achieve LDL '70mg'$ , HDL >$Remo'40mg'VEpjx$ .    Expected Outcomes Short Term: Participant states understanding of desired cholesterol values and is compliant with medications prescribed. Participant is following exercise  prescription and nutrition guidelines.;Long Term: Cholesterol controlled with medications as prescribed, with individualized exercise RX and with personalized nutrition plan. Value goals: LDL < $Rem'70mg'WaXN$ , HDL > 40  mg.           Education:Diabetes - Individual verbal and written instruction to review signs/symptoms of diabetes, desired ranges of glucose level fasting, after meals and with exercise. Acknowledge that pre and post exercise glucose checks will be done for 3 sessions at entry of program. [place] from 12/30/2020 in [facility] Cardiac and Pulmonary Rehab  Date 11/25/20  Educator [facility]  Instruction Review Code 1- Verbalizes Understanding      Core Components/Risk Factors/Patient Goals Review:   Goals and Risk Factor Review    Row Name 12/24/20 1616             Core Components/Risk Factors/Patient Goals Review   Personal Goals Review Weight Management/Obesity;Hypertension;Lipids;Diabetes       Review Elery hasn't been monitoring too much at home. He does weigh himself but admits not as often as he should, he does have a scale at home. Stressed importance on sudden weight gain. Patient reports "he has no goals for his weight" at this time. He doesn't take BP at home- he reports it fluctuates all over, though it has been pretty stable here at rehab. Encouraged him to take his BP more often and starting logging it at home, he does have a cuff to use. He is meeting with RD to establish nutrition goals. Sugars are high, around 160- 200 in the morning- staying compliant with all medications. Doctor hasn't gave him a goal for his sugars per patient.  He is trying to cut out sweets. Also talked about the importance of exercise with helping to regulate blood sugars. He used to eat out a lot and is also trying to cut that down as much as he can. Encouraged to start logging BP, weight, and BS.       Expected Outcomes Short: Start log to monitor at home (HR, BP, sugar, weight, etc.).  Long: Continue to manage lifestyle risk factors              Core Components/Risk Factors/Patient Goals at Discharge (Final Review):   Goals and Risk Factor Review - 12/24/20 1616      Core Components/Risk Factors/Patient Goals Review   Personal Goals Review Weight Management/Obesity;Hypertension;Lipids;Diabetes    Review Jeancarlos hasn't been monitoring too much at home. He does weigh himself but admits not as often as he should, he does have a scale at home. Stressed importance on sudden weight gain. Patient reports "he has no goals for his weight" at this time. He doesn't take BP at home- he reports it fluctuates all over, though it has been pretty stable here at rehab. Encouraged him to take his BP more often and starting logging it at home, he does have a cuff to use. He is meeting with RD to establish nutrition goals. Sugars are high, around 160- 200 in the morning- staying compliant with all medications. Doctor hasn't gave him a goal for his sugars per patient.  He is trying to cut out sweets. Also talked about the importance of exercise with helping to regulate blood sugars. He used to eat out a lot and is also trying to cut that down as much as he can. Encouraged to start logging BP, weight, and BS.    Expected Outcomes Short: Start log to monitor at home (HR, BP, sugar, weight, etc.). Long: Continue to manage lifestyle risk factors           ITP Comments:  ITP Comments    Row Name 11/25/20 1055 12/02/20 1749  12/07/20 1718 12/09/20 1130 01/06/21 0642   ITP Comments Initial telephone orientation completed. Diagnosis can be found in [facility] 3/2. EP orientation scheduled for Wednesday 3/16 at 8am. Completed 6MWT and gym orientation. Initial ITP created and sent for review to Dr. Emily Filbert, Medical Director. First full day of exercise!  Patient was oriented to gym and equipment including functions, settings, policies, and procedures.  Patient's individual exercise prescription and treatment  plan were reviewed.  All starting workloads were established based on the results of the 6 minute walk test done at initial orientation visit.  The plan for exercise progression was also introduced and progression will be customized based on patient's performance and goals. 30 Day review completed. Medical Director ITP review done, changes made as directed, and signed approval by Medical Director.  New to program 30 Day review completed. Medical Director ITP review done, changes made as directed, and signed approval by Medical Director.          Comments:

## 2021-01-06 NOTE — Progress Notes (Signed)
Daily Session Note  Patient Details  Name: Jaylee Lantry MRN: [id number] Date of Birth: [birth year] Referring Provider:   Flowsheet Row Cardiac Rehab from 12/02/2020 in [facility] Cardiac and Pulmonary Rehab  Referring Provider [name] MD  [Dr. Serafina Royals      Encounter Date: 01/06/2021  Check In:  Session Check In - 01/06/21 1547      Check-In   Supervising physician immediately available to respond to emergencies See telemetry face sheet for immediately available ER MD    Location [facility]-Cardiac & Pulmonary Rehab    Staff Present Renita Papa, RN BSN;Joseph Lou Miner, [place] Exercise Physiologist    Virtual Visit No    Medication changes reported     No    Fall or balance concerns reported    No    Warm-up and Cool-down Performed on first and last piece of equipment    Resistance Training Performed Yes    VAD Patient? No    PAD/SET Patient? No      Pain Assessment   Currently in Pain? No/denies              Social History   Tobacco Use  Smoking Status Never Smoker  Smokeless Tobacco Never Used    Goals Met:  Independence with exercise equipment Exercise tolerated well No report of cardiac concerns or symptoms Strength training completed today  Goals Unmet:  Not Applicable  Comments: Pt able to follow exercise prescription today without complaint.  Will continue to monitor for progression.    Dr. Emily Filbert is Medical Director for [facility] and [facility].

## 2021-01-07 ENCOUNTER — Other Ambulatory Visit: Payer: Self-pay

## 2021-01-07 ENCOUNTER — Encounter: Payer: PRIVATE HEALTH INSURANCE | Admitting: *Deleted

## 2021-01-07 ENCOUNTER — Other Ambulatory Visit: Payer: Self-pay | Admitting: Obstetrics and Gynecology

## 2021-01-07 DIAGNOSIS — Z955 Presence of coronary angioplasty implant and graft: Secondary | ICD-10-CM

## 2021-01-07 DIAGNOSIS — I214 Non-ST elevation (NSTEMI) myocardial infarction: Secondary | ICD-10-CM | POA: Diagnosis not present

## 2021-01-07 NOTE — Progress Notes (Signed)
Daily Session Note  Patient Details  Name: James [name] MRN: [id number] Date of Birth: Apr 03, 1981 Referring Provider:   Flowsheet Row Cardiac Rehab from 12/02/2020 in [facility] Cardiac and Pulmonary Rehab  Referring Provider [name] MD  [Dr. Serafina Royals      Encounter Date: 01/07/2021  Check In:  Session Check In - 01/07/21 1538      Check-In   Supervising physician immediately available to respond to emergencies See telemetry face sheet for immediately available ER MD    Location [facility]-Cardiac & Pulmonary Rehab    Staff Present Renita Papa, RN BSN;Joseph Hood RCP,RRT,BSRT;Melissa Ogallah RDN, LDN    Virtual Visit No    Medication changes reported     No    Fall or balance concerns reported    No    Warm-up and Cool-down Performed on first and last piece of equipment    Resistance Training Performed Yes    VAD Patient? No    PAD/SET Patient? No      Pain Assessment   Currently in Pain? No/denies              Social History   Tobacco Use  Smoking Status Never Smoker  Smokeless Tobacco Never Used    Goals Met:  Independence with exercise equipment Exercise tolerated well No report of cardiac concerns or symptoms Strength training completed today  Goals Unmet:  Not Applicable  Comments: Pt able to follow exercise prescription today without complaint.  Will continue to monitor for progression.    Dr. Emily Filbert is Medical Director for [place] and [facility].

## 2021-01-08 ENCOUNTER — Ambulatory Visit: Payer: BC Managed Care – PPO | Admitting: Family Medicine

## 2021-01-11 ENCOUNTER — Encounter: Payer: PRIVATE HEALTH INSURANCE | Admitting: *Deleted

## 2021-01-11 ENCOUNTER — Other Ambulatory Visit: Payer: Self-pay

## 2021-01-11 DIAGNOSIS — I214 Non-ST elevation (NSTEMI) myocardial infarction: Secondary | ICD-10-CM | POA: Diagnosis not present

## 2021-01-11 DIAGNOSIS — Z955 Presence of coronary angioplasty implant and graft: Secondary | ICD-10-CM

## 2021-01-11 NOTE — Progress Notes (Signed)
Daily Session Note  Patient Details  Name: James [name] MRN: [id number] Date of Birth: 31-Mar-1981 Referring Provider:   Flowsheet Row Cardiac Rehab from 12/02/2020 in [facility] Cardiac and Pulmonary Rehab  Referring Provider [name] MD  [Dr. Serafina Royals      Encounter Date: 01/11/2021  Check In:  Session Check In - 01/11/21 1709      Check-In   Supervising physician immediately available to respond to emergencies See telemetry face sheet for immediately available ER MD    Location [facility]-Cardiac & Pulmonary Rehab    Staff Present Renita Papa, RN Moises Blood, BS, ACSM CEP, Exercise Physiologist;Kara Eliezer Bottom, MS Exercise Physiologist    Virtual Visit No    Medication changes reported     No    Fall or balance concerns reported    No    Warm-up and Cool-down Performed on first and last piece of equipment    Resistance Training Performed Yes    VAD Patient? No    PAD/SET Patient? No      Pain Assessment   Currently in Pain? No/denies              Social History   Tobacco Use  Smoking Status Never Smoker  Smokeless Tobacco Never Used    Goals Met:  Independence with exercise equipment Exercise tolerated well No report of cardiac concerns or symptoms Strength training completed today  Goals Unmet:  Not Applicable  Comments: Pt able to follow exercise prescription today without complaint.  Will continue to monitor for progression.    Dr. Emily Filbert is Medical Director for [place] and [facility].

## 2021-01-13 ENCOUNTER — Encounter: Payer: Self-pay | Admitting: Family Medicine

## 2021-01-13 ENCOUNTER — Ambulatory Visit (INDEPENDENT_AMBULATORY_CARE_PROVIDER_SITE_OTHER): Payer: PRIVATE HEALTH INSURANCE | Admitting: Family Medicine

## 2021-01-13 ENCOUNTER — Other Ambulatory Visit: Payer: Self-pay

## 2021-01-13 VITALS — BP 130/72 | HR 100 | Temp 97.6°F | Ht 77.1 in | Wt 274.0 lb

## 2021-01-13 DIAGNOSIS — E1165 Type 2 diabetes mellitus with hyperglycemia: Secondary | ICD-10-CM | POA: Diagnosis not present

## 2021-01-13 DIAGNOSIS — IMO0002 Reserved for concepts with insufficient information to code with codable children: Secondary | ICD-10-CM

## 2021-01-13 DIAGNOSIS — E1140 Type 2 diabetes mellitus with diabetic neuropathy, unspecified: Secondary | ICD-10-CM

## 2021-01-13 DIAGNOSIS — I214 Non-ST elevation (NSTEMI) myocardial infarction: Secondary | ICD-10-CM

## 2021-01-13 DIAGNOSIS — I2510 Atherosclerotic heart disease of native coronary artery without angina pectoris: Secondary | ICD-10-CM

## 2021-01-13 DIAGNOSIS — I10 Essential (primary) hypertension: Secondary | ICD-10-CM

## 2021-01-13 DIAGNOSIS — Z955 Presence of coronary angioplasty implant and graft: Secondary | ICD-10-CM

## 2021-01-13 LAB — POCT GLYCOSYLATED HEMOGLOBIN (HGB A1C): Hemoglobin A1C: 8.4 % — AB (ref 4.0–5.6)

## 2021-01-13 MED ORDER — LISINOPRIL 5 MG PO TABS: 5.0000 mg | ORAL_TABLET | Freq: Every day | ORAL | 3 refills | Status: DC

## 2021-01-13 NOTE — Progress Notes (Signed)
Patient ID: James [name], male    DOB: August 01, 1981, 40 y.o.   MRN: [id number]  This visit was conducted in person.  BP 130/72   Pulse 100   Temp 97.6 F (36.4 C) (Temporal)   Ht 6' 5.1" (1.958 m)   Wt 274 lb (124.3 kg)   SpO2 98%   BMI 32.41 kg/m    CC: 6 mo DM f/u visit  Subjective:   HPI: James [name] is a 40 y.o. male presenting on 01/13/2021 for Diabetes (Here 6 mo f/u.)   Recent NSTEMI seeing cards Gwen Pounds - planned exercise SESTAMIBI scan to re-eval RCA stenosis. Continues cardiac rehab.   DM - does not regularly check sugars. Compliant with antihyperglycemic regimen which includes: amaryl 2mg  daily, lantus 10u daily, metformin 1000mg  bid. SGLT2, GLP1RA unaffordable. Denies low sugars or hypoglycemic symptoms. Denies paresthesias. Last diabetic eye exam 05/2020. Pneumovax: 2015. Prevnar: not due. Glucometer brand: Next. DSME: completed 09/2017.  Lab Results  Component Value Date   HGBA1C 8.4 (A) 01/13/2021   Diabetic Foot Exam - Simple   Simple Foot Form Diabetic Foot exam was performed with the following findings: Yes 01/13/2021  9:14 AM  Visual Inspection No deformities, no ulcerations, no other skin breakdown bilaterally: Yes Sensation Testing Intact to touch and monofilament testing bilaterally: Yes Pulse Check Posterior Tibialis and Dorsalis pulse intact bilaterally: Yes Comments    Lab Results  Component Value Date   MICROALBUR 1.4 04/17/2019         Relevant past medical, surgical, family and social history reviewed and updated as indicated. Interim medical history since our last visit reviewed. Allergies and medications reviewed and updated. Outpatient Medications Prior to Visit  Medication Sig Dispense Refill  . aspirin EC 81 MG EC tablet Take 1 tablet (81 mg total) by mouth daily. Swallow whole. 30 tablet 11  . atorvastatin (LIPITOR) 80 MG tablet Take 1 tablet (80 mg total) by mouth at bedtime. 30 tablet 1  . citalopram  (CELEXA) 20 MG tablet TAKE ONE TABLET BY MOUTH DAILY 90 tablet 1  . clopidogrel (PLAVIX) 75 MG tablet Take 1 tablet (75 mg total) by mouth daily. 90 tablet 1  . glimepiride (AMARYL) 2 MG tablet Take 1 tablet (2 mg total) by mouth daily with breakfast. Take 1 tablet by mouth once daily with breakfast 30 tablet 1  . insulin glargine (LANTUS SOLOSTAR) 100 UNIT/ML Solostar Pen Inject 10 Units into the skin daily. 15 mL 1  . metFORMIN (GLUCOPHAGE) 1000 MG tablet TAKE 1 TABLET BY MOUTH TWICE DAILY WITH MEALS (Patient taking differently: Take 1,000 mg by mouth 2 (two) times daily with a meal.) 180 tablet 3  . metoprolol succinate (TOPROL XL) 25 MG 24 hr tablet Take 1.5 tablets (37.5 mg total) by mouth daily. 45 tablet 6  . Multiple Vitamin (MULTIVITAMIN ADULT) TABS Take 1 tablet by mouth daily. Nature's nutrition Blood sugar support - with cinnamon, chromium, and ALA    . Omega-3 Fatty Acids (FISH OIL) 1200 MG CAPS Take 1 capsule by mouth in the morning and at bedtime.    . vitamin B-12 (V-R VITAMIN B-12) 500 MCG tablet Take 1 tablet (500 mcg total) by mouth daily.    01/15/2021 lisinopril (ZESTRIL) 5 MG tablet Take 1 tablet (5 mg total) by mouth daily. 30 tablet 1   No facility-administered medications prior to visit.     Per HPI unless specifically indicated in ROS section below Review of Systems Objective:  BP 130/72  Pulse 100   Temp 97.6 F (36.4 C) (Temporal)   Ht 6' 5.1" (1.958 m)   Wt 274 lb (124.3 kg)   SpO2 98%   BMI 32.41 kg/m   Wt Readings from Last 3 Encounters:  01/13/21 274 lb (124.3 kg)  12/02/20 276 lb (125.2 kg)  11/30/20 270 lb (122.5 kg)      Physical Exam Vitals and nursing note reviewed.  Constitutional:      General: He is not in acute distress.    Appearance: Normal appearance. He is well-developed. He is not ill-appearing.  HENT:     Head: Normocephalic and atraumatic.  Eyes:     General: No scleral icterus.    Conjunctiva/sclera: Conjunctivae normal.     Pupils:  Pupils are equal, round, and reactive to light.  Cardiovascular:     Rate and Rhythm: Normal rate and regular rhythm.     Pulses: Normal pulses.     Heart sounds: Normal heart sounds. No murmur heard.   Pulmonary:     Effort: Pulmonary effort is normal. No respiratory distress.     Breath sounds: Normal breath sounds. No wheezing, rhonchi or rales.  Musculoskeletal:        General: Normal range of motion.     Cervical back: Normal range of motion and neck supple.     Right lower leg: No edema.     Left lower leg: No edema.     Comments: See HPI for foot exam if done  Lymphadenopathy:     Cervical: No cervical adenopathy.  Skin:    General: Skin is warm and dry.     Findings: No rash.  Neurological:     Mental Status: He is alert.  Psychiatric:        Mood and Affect: Mood normal.        Behavior: Behavior normal.       Results for orders placed or performed in visit on 01/13/21  POCT glycosylated hemoglobin (Hb A1C)  Result Value Ref Range   Hemoglobin A1C 8.4 (A) 4.0 - 5.6 %   HbA1c POC (<> result, manual entry)     HbA1c, POC (prediabetic range)     HbA1c, POC (controlled diabetic range)     Assessment & Plan:  This visit occurred during the SARS-CoV-2 public health emergency.  Safety protocols were in place, including screening questions prior to the visit, additional usage of staff PPE, and extensive cleaning of exam room while observing appropriate contact time as indicated for disinfecting solutions.   Problem List Items Addressed This Visit    HTN (hypertension)    Chronic, stable on current regimen - continue.       Relevant Medications   lisinopril (ZESTRIL) 5 MG tablet   Type 2 diabetes, uncontrolled, with neuropathy (HCC) - Primary    Titrate lantus for goal fasting cbg <150, A1c <6.5% as per instructions. RTC 3 mo DM f/u visit.  Encouraged low sugar/low carb diet.       Relevant Medications   lisinopril (ZESTRIL) 5 MG tablet   Other Relevant Orders    POCT glycosylated hemoglobin (Hb A1C) (Completed)   CAD (coronary artery disease)    Appreciate cards care. Planned rpt scan to eval other blockages.       Relevant Medications   lisinopril (ZESTRIL) 5 MG tablet       Meds ordered this encounter  Medications  . lisinopril (ZESTRIL) 5 MG tablet    Sig: Take 1 tablet (5 mg  total) by mouth daily.    Dispense:  90 tablet    Refill:  3   Orders Placed This Encounter  Procedures  . POCT glycosylated hemoglobin (Hb A1C)    Patient Instructions  Start titrating lantus by 2 units every 3 days if average fasting sugar >150.  Let us know if you reach 20 units daily for further instructions.  Continue other medicines.  Return in 3 months for diabetes follow up visit.    Follow up plan: Return in about 3 months (around 04/14/2021), or if symptoms worsen or fail to improve, for follow up visit.  [name], MD

## 2021-01-13 NOTE — Progress Notes (Signed)
Daily Session Note  Patient Details  Name: James [name] MRN: [id number] Date of Birth: 1981-08-16 Referring Provider:   Flowsheet Row Cardiac Rehab from 12/02/2020 in [facility] Cardiac and Pulmonary Rehab  Referring Provider [name] MD  [Dr. Serafina Royals      Encounter Date: 01/13/2021  Check In:  Session Check In - 01/13/21 1550      Check-In   Supervising physician immediately available to respond to emergencies See telemetry face sheet for immediately available ER MD    Location [facility]-Cardiac & Pulmonary Rehab    Staff Present Birdie Sons, MPA, RN;Joseph Lou Miner, MS Exercise Physiologist    Virtual Visit No    Medication changes reported     No    Fall or balance concerns reported    No    Warm-up and Cool-down Performed on first and last piece of equipment    Resistance Training Performed Yes    VAD Patient? No    PAD/SET Patient? No      Pain Assessment   Currently in Pain? No/denies              Social History   Tobacco Use  Smoking Status Never Smoker  Smokeless Tobacco Never Used    Goals Met:  Independence with exercise equipment Exercise tolerated well No report of cardiac concerns or symptoms Strength training completed today  Goals Unmet:  Not Applicable  Comments: Pt able to follow exercise prescription today without complaint.  Will continue to monitor for progression.    Dr. Emily Filbert is Medical Director for [place] and [facility].

## 2021-01-13 NOTE — Patient Instructions (Addendum)
Start titrating lantus by 2 units every 3 days if average fasting sugar >150.  Let us know if you reach 20 units daily for further instructions.  Continue other medicines.  Return in 3 months for diabetes follow up visit.

## 2021-01-13 NOTE — Assessment & Plan Note (Signed)
Chronic, stable on current regimen - continue. 

## 2021-01-13 NOTE — Assessment & Plan Note (Addendum)
Titrate lantus for goal fasting cbg <150, A1c <6.5% as per instructions. RTC 3 mo DM f/u visit.  Encouraged low sugar/low carb diet.

## 2021-01-13 NOTE — Assessment & Plan Note (Signed)
Appreciate cards care. Planned rpt scan to eval other blockages.

## 2021-01-14 ENCOUNTER — Encounter: Payer: PRIVATE HEALTH INSURANCE | Admitting: *Deleted

## 2021-01-14 DIAGNOSIS — Z955 Presence of coronary angioplasty implant and graft: Secondary | ICD-10-CM

## 2021-01-14 DIAGNOSIS — I214 Non-ST elevation (NSTEMI) myocardial infarction: Secondary | ICD-10-CM

## 2021-01-14 NOTE — Progress Notes (Signed)
Daily Session Note  Patient Details  Name: James [name] MRN: [id number] Date of Birth: 06/27/1981 Referring Provider:   Flowsheet Row Cardiac Rehab from 12/02/2020 in [facility] Cardiac and Pulmonary Rehab  Referring Provider Paraschos, Alexander MD  [Dr. Bruce Kowalski]      Encounter Date: 01/14/2021  Check In:  Session Check In - 01/14/21 1539      Check-In   Supervising physician immediately available to respond to emergencies See telemetry face sheet for immediately available ER MD    Location [facility]-Cardiac & Pulmonary Rehab    Staff Present Meredith Craven, RN BSN;Joseph Hood RCP,RRT,BSRT;Jessica Hawkins, MA, RCEP, CCRP, CCET    Virtual Visit No    Medication changes reported     No    Fall or balance concerns reported    No    Warm-up and Cool-down Performed on first and last piece of equipment    Resistance Training Performed Yes    VAD Patient? No    PAD/SET Patient? No      Pain Assessment   Currently in Pain? No/denies              Social History   Tobacco Use  Smoking Status Never Smoker  Smokeless Tobacco Never Used    Goals Met:  Independence with exercise equipment Exercise tolerated well No report of cardiac concerns or symptoms Strength training completed today  Goals Unmet:  Not Applicable  Comments: Pt able to follow exercise prescription today without complaint.  Will continue to monitor for progression.    Dr. Mark Miller is Medical Director for [facility] and [facility]. 

## 2021-01-18 ENCOUNTER — Other Ambulatory Visit: Payer: Self-pay

## 2021-01-18 ENCOUNTER — Encounter: Payer: PRIVATE HEALTH INSURANCE | Attending: Cardiology | Admitting: *Deleted

## 2021-01-18 DIAGNOSIS — I214 Non-ST elevation (NSTEMI) myocardial infarction: Secondary | ICD-10-CM | POA: Diagnosis not present

## 2021-01-18 DIAGNOSIS — Z955 Presence of coronary angioplasty implant and graft: Secondary | ICD-10-CM | POA: Insufficient documentation

## 2021-01-18 NOTE — Progress Notes (Signed)
Daily Session Note  Patient Details  Name: James [name] MRN: [id number] Date of Birth: 1981/01/13 Referring Provider:   Flowsheet Row Cardiac Rehab from 12/02/2020 in [facility] Cardiac and Pulmonary Rehab  Referring Provider [name] MD  [Dr. Serafina Royals      Encounter Date: 01/18/2021  Check In:  Session Check In - 01/18/21 1603      Check-In   Supervising physician immediately available to respond to emergencies See telemetry face sheet for immediately available ER MD    Location [facility]-Cardiac & Pulmonary Rehab    Staff Present Renita Papa, RN Margurite Auerbach, MS Exercise Physiologist;Kelly Amedeo Plenty, BS, ACSM CEP, Exercise Physiologist    Virtual Visit No    Medication changes reported     Yes    Comments increased to 14 units of insulin    Warm-up and Cool-down Performed on first and last piece of equipment    Resistance Training Performed Yes    VAD Patient? No    PAD/SET Patient? No      Pain Assessment   Currently in Pain? No/denies              Social History   Tobacco Use  Smoking Status Never Smoker  Smokeless Tobacco Never Used    Goals Met:  Independence with exercise equipment Exercise tolerated well No report of cardiac concerns or symptoms Strength training completed today  Goals Unmet:  Not Applicable  Comments: Pt able to follow exercise prescription today without complaint.  Will continue to monitor for progression.    Dr. Emily Filbert is Medical Director for [place] and [facility].

## 2021-01-20 ENCOUNTER — Other Ambulatory Visit: Payer: Self-pay

## 2021-01-20 DIAGNOSIS — Z955 Presence of coronary angioplasty implant and graft: Secondary | ICD-10-CM

## 2021-01-20 DIAGNOSIS — I214 Non-ST elevation (NSTEMI) myocardial infarction: Secondary | ICD-10-CM | POA: Diagnosis not present

## 2021-01-20 NOTE — Progress Notes (Signed)
Daily Session Note  Patient Details  Name: Flem Enderle MRN: [id number] Date of Birth: 09-29-1980 Referring Provider:   Flowsheet Row Cardiac Rehab from 12/02/2020 in [facility] Cardiac and Pulmonary Rehab  Referring Provider [name] MD  [Dr. Serafina Royals      Encounter Date: 01/20/2021  Check In:  Session Check In - 01/20/21 1624      Check-In   Supervising physician immediately available to respond to emergencies See telemetry face sheet for immediately available ER MD    Location [facility]-Cardiac & Pulmonary Rehab    Staff Present Birdie Sons, MPA, RN;Joseph Lou Miner, MS Exercise Physiologist    Virtual Visit No    Medication changes reported     No    Fall or balance concerns reported    No    Warm-up and Cool-down Performed on first and last piece of equipment    Resistance Training Performed Yes    VAD Patient? No    PAD/SET Patient? No      Pain Assessment   Currently in Pain? No/denies              Social History   Tobacco Use  Smoking Status Never Smoker  Smokeless Tobacco Never Used    Goals Met:  Independence with exercise equipment Exercise tolerated well No report of cardiac concerns or symptoms Strength training completed today  Goals Unmet:  Not Applicable  Comments: Pt able to follow exercise prescription today without complaint.  Will continue to monitor for progression.    Dr. Emily Filbert is Medical Director for [place] and [facility].

## 2021-01-21 ENCOUNTER — Encounter: Payer: PRIVATE HEALTH INSURANCE | Admitting: *Deleted

## 2021-01-21 ENCOUNTER — Other Ambulatory Visit: Payer: Self-pay

## 2021-01-21 DIAGNOSIS — I214 Non-ST elevation (NSTEMI) myocardial infarction: Secondary | ICD-10-CM | POA: Diagnosis not present

## 2021-01-21 DIAGNOSIS — Z955 Presence of coronary angioplasty implant and graft: Secondary | ICD-10-CM

## 2021-01-21 NOTE — Progress Notes (Signed)
Daily Session Note  Patient Details  Name: James [name] MRN: [id number] Date of Birth: 12/08/1980 Referring Provider:   Flowsheet Row Cardiac Rehab from 12/02/2020 in [facility] Cardiac and Pulmonary Rehab  Referring Provider [name] MD  [Dr. Serafina Royals      Encounter Date: 01/21/2021  Check In:  Session Check In - 01/21/21 1532      Check-In   Supervising physician immediately available to respond to emergencies See telemetry face sheet for immediately available ER MD    Location [facility]-Cardiac & Pulmonary Rehab    Staff Present Renita Papa, RN BSN;Joseph Hood RCP,RRT,BSRT;Amanda Oletta Darter, [place], ACSM CEP, Exercise Physiologist    Virtual Visit No    Medication changes reported     No    Fall or balance concerns reported    No    Warm-up and Cool-down Performed on first and last piece of equipment    Resistance Training Performed Yes    VAD Patient? No    PAD/SET Patient? No      Pain Assessment   Currently in Pain? No/denies              Social History   Tobacco Use  Smoking Status Never Smoker  Smokeless Tobacco Never Used    Goals Met:  Independence with exercise equipment Exercise tolerated well No report of cardiac concerns or symptoms Strength training completed today  Goals Unmet:  Not Applicable  Comments: Pt able to follow exercise prescription today without complaint.  Will continue to monitor for progression.    Dr. Emily Filbert is Medical Director for [place] and [facility].

## 2021-01-25 ENCOUNTER — Other Ambulatory Visit: Payer: Self-pay

## 2021-01-25 DIAGNOSIS — Z955 Presence of coronary angioplasty implant and graft: Secondary | ICD-10-CM

## 2021-01-25 DIAGNOSIS — I214 Non-ST elevation (NSTEMI) myocardial infarction: Secondary | ICD-10-CM | POA: Diagnosis not present

## 2021-01-25 NOTE — Progress Notes (Signed)
Daily Session Note  Patient Details  Name: James [name] MRN: [id number] Date of Birth: January 09, 1981 Referring Provider:   Flowsheet Row Cardiac Rehab from 12/02/2020 in [facility] Cardiac and Pulmonary Rehab  Referring Provider [name] MD  [Dr. Serafina Royals      Encounter Date: 01/25/2021  Check In:  Session Check In - 01/25/21 1559      Check-In   Supervising physician immediately available to respond to emergencies See telemetry face sheet for immediately available ER MD    Location [facility]-Cardiac & Pulmonary Rehab    Staff Present Birdie Sons, MPA, Mauricia Area, BS, ACSM CEP, Exercise Physiologist;Kara Eliezer Bottom, MS Exercise Physiologist    Virtual Visit No    Medication changes reported     No    Fall or balance concerns reported    No    Warm-up and Cool-down Performed on first and last piece of equipment    Resistance Training Performed Yes    VAD Patient? No    PAD/SET Patient? No      Pain Assessment   Currently in Pain? No/denies              Social History   Tobacco Use  Smoking Status Never Smoker  Smokeless Tobacco Never Used    Goals Met:  Independence with exercise equipment Exercise tolerated well No report of cardiac concerns or symptoms Strength training completed today  Goals Unmet:  Not Applicable  Comments: Pt able to follow exercise prescription today without complaint.  Will continue to monitor for progression.    Dr. Emily Filbert is Medical Director for [place] and [facility].

## 2021-01-27 ENCOUNTER — Other Ambulatory Visit: Payer: Self-pay

## 2021-01-27 DIAGNOSIS — I214 Non-ST elevation (NSTEMI) myocardial infarction: Secondary | ICD-10-CM

## 2021-01-27 DIAGNOSIS — Z955 Presence of coronary angioplasty implant and graft: Secondary | ICD-10-CM

## 2021-01-27 NOTE — Progress Notes (Signed)
Daily Session Note  Patient Details  Name: James [name] MRN: [id number] Date of Birth: 22-Dec-1980 Referring Provider:   Flowsheet Row Cardiac Rehab from 12/02/2020 in [facility] Cardiac and Pulmonary Rehab  Referring Provider [name] MD  [Dr. Serafina Royals      Encounter Date: 01/27/2021  Check In:  Session Check In - 01/27/21 1603      Check-In   Supervising physician immediately available to respond to emergencies See telemetry face sheet for immediately available ER MD    Location [facility]-Cardiac & Pulmonary Rehab    Staff Present Birdie Sons, MPA, RN;Meredith Sherryll Burger, RN Margurite Auerbach, MS Exercise Physiologist    Virtual Visit No    Medication changes reported     No    Fall or balance concerns reported    No    Warm-up and Cool-down Performed on first and last piece of equipment    Resistance Training Performed Yes    VAD Patient? No    PAD/SET Patient? No      Pain Assessment   Currently in Pain? No/denies              Social History   Tobacco Use  Smoking Status Never Smoker  Smokeless Tobacco Never Used    Goals Met:  Independence with exercise equipment Exercise tolerated well No report of cardiac concerns or symptoms Strength training completed today  Goals Unmet:  Not Applicable  Comments: Pt able to follow exercise prescription today without complaint.  Will continue to monitor for progression.    Dr. Emily Filbert is Medical Director for [place] and [facility].

## 2021-01-28 ENCOUNTER — Encounter: Payer: PRIVATE HEALTH INSURANCE | Admitting: *Deleted

## 2021-01-28 ENCOUNTER — Other Ambulatory Visit: Payer: Self-pay

## 2021-01-28 DIAGNOSIS — I214 Non-ST elevation (NSTEMI) myocardial infarction: Secondary | ICD-10-CM | POA: Diagnosis not present

## 2021-01-28 DIAGNOSIS — Z955 Presence of coronary angioplasty implant and graft: Secondary | ICD-10-CM

## 2021-01-28 NOTE — Progress Notes (Signed)
Daily Session Note  Patient Details  Name: James [name] MRN: [id number] Date of Birth: 1981-07-12 Referring Provider:   Flowsheet Row Cardiac Rehab from 12/02/2020 in [facility] Cardiac and Pulmonary Rehab  Referring Provider [name] MD  [Dr. Serafina Royals      Encounter Date: 01/28/2021  Check In:  Session Check In - 01/28/21 1735      Check-In   Supervising physician immediately available to respond to emergencies See telemetry face sheet for immediately available ER MD    Location [facility]-Cardiac & Pulmonary Rehab    Staff Present Heath Lark, RN, BSN, CCRP;Joseph Foy [place], [place], ACSM CEP, Exercise Physiologist    Virtual Visit No    Medication changes reported     No    Fall or balance concerns reported    No    Warm-up and Cool-down Performed on first and last piece of equipment    Resistance Training Performed Yes    VAD Patient? No    PAD/SET Patient? No      Pain Assessment   Currently in Pain? No/denies              Social History   Tobacco Use  Smoking Status Never Smoker  Smokeless Tobacco Never Used    Goals Met:  Independence with exercise equipment Exercise tolerated well No report of cardiac concerns or symptoms  Goals Unmet:  Not Applicable  Comments: Pt able to follow exercise prescription today without complaint.  Will continue to monitor for progression.    Dr. Emily Filbert is Medical Director for [place] and [facility].

## 2021-02-01 ENCOUNTER — Other Ambulatory Visit: Payer: Self-pay

## 2021-02-01 DIAGNOSIS — Z955 Presence of coronary angioplasty implant and graft: Secondary | ICD-10-CM

## 2021-02-01 DIAGNOSIS — I214 Non-ST elevation (NSTEMI) myocardial infarction: Secondary | ICD-10-CM

## 2021-02-01 NOTE — Progress Notes (Signed)
Daily Session Note  Patient Details  Name: Kemonte Ullman MRN: [id number] Date of Birth: 06-Apr-1981 Referring Provider:   Flowsheet Row Cardiac Rehab from 12/02/2020 in [facility] Cardiac and Pulmonary Rehab  Referring Provider [name] MD  [Dr. Serafina Royals      Encounter Date: 02/01/2021  Check In:  Session Check In - 02/01/21 1609      Check-In   Supervising physician immediately available to respond to emergencies See telemetry face sheet for immediately available ER MD    Location [facility]-Cardiac & Pulmonary Rehab    Staff Present Birdie Sons, MPA, Mauricia Area, BS, ACSM CEP, Exercise Physiologist;Laureen Owens Shark, BS, RRT, CPFT    Virtual Visit No    Medication changes reported     No    Fall or balance concerns reported    No    Warm-up and Cool-down Performed on first and last piece of equipment    Resistance Training Performed Yes    VAD Patient? No    PAD/SET Patient? No      Pain Assessment   Currently in Pain? No/denies              Social History   Tobacco Use  Smoking Status Never Smoker  Smokeless Tobacco Never Used    Goals Met:  Independence with exercise equipment Exercise tolerated well No report of cardiac concerns or symptoms Strength training completed today  Goals Unmet:  Not Applicable  Comments: Pt able to follow exercise prescription today without complaint.  Will continue to monitor for progression.    Dr. Emily Filbert is Medical Director for [place] and [facility].

## 2021-02-03 ENCOUNTER — Other Ambulatory Visit: Payer: Self-pay

## 2021-02-03 ENCOUNTER — Encounter: Payer: Self-pay | Admitting: *Deleted

## 2021-02-03 DIAGNOSIS — Z955 Presence of coronary angioplasty implant and graft: Secondary | ICD-10-CM

## 2021-02-03 DIAGNOSIS — I214 Non-ST elevation (NSTEMI) myocardial infarction: Secondary | ICD-10-CM | POA: Diagnosis not present

## 2021-02-03 NOTE — Progress Notes (Signed)
Discharge Progress Report  Patient Details  Name: James [name] MRN: [id number] Date of Birth: 30-Jan-1981 Referring Provider:   Flowsheet Row Cardiac Rehab from 12/02/2020 in [facility]  Referring Provider [name] MD  [Dr. Serafina Royals       Number of Visits: 76  Reason for Discharge:  Patient reached a stable level of exercise. Patient independent in their exercise. Patient has met program and personal goals.  Smoking History:  Social History   Tobacco Use  Smoking Status Never Smoker  Smokeless Tobacco Never Used    Diagnosis:  Status post coronary artery stent placement  NSTEMI (non-ST elevated myocardial infarction) ([place])  ADL [facility]:   Initial Exercise Prescription:  Initial Exercise Prescription - 12/02/20 0900      Date of Initial Exercise RX and Referring Provider   Date 12/02/20    Referring Provider [name] MD   Dr. Serafina Royals     Treadmill   MPH 2.5    Grade 2    Minutes 15    METs 3.6      Recumbant Elliptical   Level 3    RPM 50    Minutes 15    METs 3.5      Elliptical   Level 1    Speed 2.5    Minutes 15    METs 3.5      Prescription Details   Frequency (times per week) 3    Duration Progress to 30 minutes of continuous aerobic without signs/symptoms of physical distress      Intensity   THRR 40-80% of Max Heartrate 131-164    Ratings of Perceived Exertion 11-13    Perceived Dyspnea 0-4      Progression   Progression Continue to progress workloads to maintain intensity without signs/symptoms of physical distress.      Resistance Training   Training Prescription Yes    Weight 3 lb    Reps 10-15           Discharge Exercise Prescription (Final Exercise Prescription Changes):  Exercise Prescription Changes - 01/26/21 1400      Response to Exercise   Blood Pressure (Admit) 124/62    Blood Pressure (Exercise) 162/82    Blood Pressure (Exit) 124/72    Heart Rate  (Admit) 95 bpm    Heart Rate (Exercise) 114 bpm    Heart Rate (Exit) 107 bpm    Rating of Perceived Exertion (Exercise) 15    Symptoms none    Duration Continue with 30 min of aerobic exercise without signs/symptoms of physical distress.    Intensity THRR unchanged      Progression   Progression Continue to progress workloads to maintain intensity without signs/symptoms of physical distress.    Average METs 5.95      Resistance Training   Training Prescription Yes    Weight 12 lb    Reps 10-15      Interval Training   Interval Training No      Treadmill   MPH 3.5    Grade 12    Minutes 15    METs 9.5      T5 Nustep   Level 6    Minutes 15    METs 2.4      Home Exercise Plan   Plans to continue exercise at Home (comment)   walking   Frequency Add 2 additional days to program exercise sessions.    Initial Home Exercises Provided 12/17/20  Functional Capacity:  6 Minute Walk    Row Name 12/02/20 0925 02/03/21 1624       6 Minute Walk   Phase Initial Discharge    Distance 1350 feet 1810 feet    Distance % Change -- 34 %    Distance Feet Change -- 460 ft    Walk Time 6 minutes 6 minutes    # of Rest Breaks 0 0    MPH 2.56 3.42    METS 4.66 5.57    RPE 7 11    Perceived Dyspnea  -- 0    VO2 Peak 16.31 19.52    Symptoms No No    Resting HR 99 bpm 100 bpm    Resting BP 120/64 100/72    Resting Oxygen Saturation  97 % 98 %    Exercise Oxygen Saturation  during 6 min walk 97 % --    Max Ex. HR 120 bpm 121 bpm    Max Ex. BP 142/80 152/72    2 Minute Post BP 122/60 --           Psychological, QOL, Others - Outcomes: PHQ 2/9: Depression screen [facility] 2/9 02/01/2021 12/02/2020 06/19/2020 04/17/2019 06/15/2018  Decreased Interest 0 0 0 0 0  Down, Depressed, Hopeless 0 0 0 0 0  PHQ - 2 Score 0 0 0 0 0  Altered sleeping 0 0 - 0 -  Tired, decreased energy 0 0 - 0 -  Change in appetite 0 0 - 0 -  Feeling bad or failure about yourself  0 0 - 0 -  Trouble  concentrating 0 0 - 0 -  Moving slowly or fidgety/restless 0 0 - 0 -  Suicidal thoughts 0 0 - 0 -  PHQ-9 Score 0 0 - 0 -  Difficult doing work/chores Not difficult at all Not difficult at all - - -    Quality of Life:  Quality of Life - 02/01/21 1036      Quality of Life Scores   Health/Function Pre 29.2 %    Health/Function Post 26.53 %    Health/Function % Change -9.14 %    Socioeconomic Pre 27 %    Socioeconomic Post 23.64 %    Socioeconomic % Change  -12.44 %    Psych/Spiritual Pre 27.86 %    Psych/Spiritual Post 30 %    Psych/Spiritual % Change 7.68 %    Family Pre 22.8 %    Family Post 21.33 %    Family % Change -6.45 %    GLOBAL Pre 27.91 %    GLOBAL Post 26.05 %    GLOBAL % Change -6.66 %          Nutrition & Weight - Outcomes:  Pre Biometrics - 12/02/20 0928      Pre Biometrics   Height 6' 5.1" (1.958 m)    Weight 276 lb (125.2 kg)    BMI (Calculated) 32.66    Single Leg Stand 30 seconds            Nutrition:  Nutrition Therapy & Goals - 01/25/21 1527      Nutrition Therapy   Diet Heart healthy, low Na, Diabetes friendly    Drug/Food Interactions Statins/Certain Fruits    Protein (specify units) 100g    Fiber 30 grams    Whole Grain Foods 3 servings    Saturated Fats 12 max. grams    Fruits and Vegetables 8 servings/day    Sodium 1.5 grams  Personal Nutrition Goals   Nutrition Goal ST: drink water during exercise and 1/2 bottle of water during the day (crystal lite), practice sructuring dinner using MyPlate LT: Structure meals using MyPlate, drink mostly water, go out to eat <3x/week    Comments B: oatmeal with brown sugar L: out to eat for lunch - [place], subway sandwich, buffalo wild wings D: whatever his mom makes: chicken cordon bleu, pita chips and tzatziki sauce. He has diet soda and crystal lite in water. Discussed heart healthy eating and diabetes friendly eating.      Intervention Plan   Intervention Prescribe, educate and counsel  regarding individualized specific dietary modifications aiming towards targeted core components such as weight, hypertension, lipid management, diabetes, heart failure and other comorbidities.;Nutrition handout(s) given to patient.    Expected Outcomes Short Term Goal: Understand basic principles of dietary content, such as calories, fat, sodium, cholesterol and nutrients.;Short Term Goal: A plan has been developed with personal nutrition goals set during dietitian appointment.;Long Term Goal: Adherence to prescribed nutrition plan.           Nutrition Discharge:   Education Questionnaire Score:  Knowledge Questionnaire Score - 02/01/21 1037      Knowledge Questionnaire Score   Pre Score 23/26 Education Focus: nutrition, heart failure, angina    Post Score 23/26           Goals reviewed with patient; copy given to patient.

## 2021-02-03 NOTE — Progress Notes (Signed)
Daily Session Note  Patient Details  Name: James [name] MRN: [id number] Date of Birth: March 15, 1981 Referring Provider:   Flowsheet Row Cardiac Rehab from 12/02/2020 in [facility]  Referring Provider James Cowman MD  [Dr. Serafina [name]      Encounter Date: 02/03/2021  Check In:  Session Check In - 02/03/21 1641      Check-In   Supervising physician immediately available to respond to emergencies See telemetry face sheet for immediately available ER MD    Location [facility]-Cardiac & Pulmonary Rehab    Staff Present James [name], MPA, RN;James Lou Miner, MS Exercise Physiologist    Virtual Visit No    Medication changes reported     No    Fall or balance concerns reported    No    Warm-up and Cool-down Performed on first and last piece of equipment    Resistance Training Performed Yes    VAD Patient? No    PAD/SET Patient? No      Pain Assessment   Currently in Pain? No/denies              Social History   Tobacco Use  Smoking Status Never Smoker  Smokeless Tobacco Never Used    Goals Met:  Independence with exercise equipment Exercise tolerated well No report of cardiac concerns or symptoms Strength training completed today  Goals Unmet:  Not Applicable  Comments:  James [name] graduated today from  rehab with 36 sessions completed.  Details of the patient's exercise prescription and what He needs to do in order to continue the prescription and progress were discussed with patient.  Patient was given a copy of prescription and goals.  Patient verbalized understanding.  James [name] plans to continue to exercise by joining the [facility].    Dr. Emily [name] is Medical Director for James [name] and [facility].

## 2021-02-03 NOTE — Progress Notes (Signed)
Cardiac Individual Treatment Plan  Patient Details  Name: James [name] MRN: [id number] Date of Birth: July 05, 1981 Referring Provider:   Flowsheet Row Cardiac Rehab from 12/02/2020 in [facility] Cardiac and Pulmonary Rehab  Referring Provider Paraschos, Alexander MD  [Dr. Serafina Royals      Initial Encounter Date:  Flowsheet Row Cardiac Rehab from 12/02/2020 in [facility] Cardiac and Pulmonary Rehab  Date 12/02/20      Visit Diagnosis: Status post coronary artery stent placement  NSTEMI (non-ST elevated myocardial infarction) [facility])  Patient's Home Medications on Admission:  Current Outpatient Medications:  .  aspirin EC 81 MG EC tablet, Take 1 tablet (81 mg total) by mouth daily. Swallow whole., Disp: 30 tablet, Rfl: 11 .  atorvastatin (LIPITOR) 80 MG tablet, Take 1 tablet (80 mg total) by mouth at bedtime., Disp: 30 tablet, Rfl: 1 .  citalopram (CELEXA) 20 MG tablet, TAKE ONE TABLET BY MOUTH DAILY, Disp: 90 tablet, Rfl: 1 .  clopidogrel (PLAVIX) 75 MG tablet, Take 1 tablet (75 mg total) by mouth daily., Disp: 90 tablet, Rfl: 1 .  glimepiride (AMARYL) 2 MG tablet, Take 1 tablet (2 mg total) by mouth daily with breakfast. Take 1 tablet by mouth once daily with breakfast, Disp: 30 tablet, Rfl: 1 .  insulin glargine (LANTUS SOLOSTAR) 100 UNIT/ML Solostar Pen, Inject 10 Units into the skin daily., Disp: 15 mL, Rfl: 1 .  lisinopril (ZESTRIL) 5 MG tablet, Take 1 tablet (5 mg total) by mouth daily., Disp: 90 tablet, Rfl: 3 .  metFORMIN (GLUCOPHAGE) 1000 MG tablet, TAKE 1 TABLET BY MOUTH TWICE DAILY WITH MEALS (Patient taking differently: Take 1,000 mg by mouth 2 (two) times daily with a meal.), Disp: 180 tablet, Rfl: 3 .  metoprolol succinate (TOPROL XL) 25 MG 24 hr tablet, Take 1.5 tablets (37.5 mg total) by mouth daily., Disp: 45 tablet, Rfl: 6 .  Multiple Vitamin (MULTIVITAMIN ADULT) TABS, Take 1 tablet by mouth daily. Nature's nutrition Blood sugar support - with cinnamon, chromium, and ALA,  Disp: , Rfl:  .  Omega-3 Fatty Acids (FISH OIL) 1200 MG CAPS, Take 1 capsule by mouth in the morning and at bedtime., Disp: , Rfl:  .  vitamin B-12 (V-R VITAMIN B-12) 500 MCG tablet, Take 1 tablet (500 mcg total) by mouth daily., Disp: , Rfl:   Past Medical History: Past Medical History:  Diagnosis Date  . Diabetes type 2, controlled ([place])   . History of asthma childhood   dust mites  . History of pneumonia 09/2014   LLL  . HTN (hypertension)   . Major depressive disorder, recurrent episode, moderate ([place]) 12/26/2015  . Mixed hyperlipidemia   . NAFLD (nonalcoholic fatty liver disease)   . Obesity   . Suicidal thoughts 12/25/2015   s/p ER evaluation    Tobacco Use: Social History   Tobacco Use  Smoking Status Never Smoker  Smokeless Tobacco Never Used    Labs: Recent Review Flowsheet Data    Labs for ITP Cardiac and Pulmonary Rehab Latest Ref Rng & Units 07/29/2019 11/26/2019 02/28/2020 06/09/2020 01/13/2021   Cholestrol 0 - 200 mg/dL - - - 149 -   LDLCALC 0 - 99 mg/dL - - - - -   LDLDIRECT mg/dL - - - 76.0 -   HDL >39.00 mg/dL - - - 26.60(L) -   Trlycerides 0.0 - 149.0 mg/dL - - - 351.0(H) -   Hemoglobin A1c 4.0 - 5.6 % 7.7(A) 9.0(A) 8.0(A) 7.2(H) 8.4(A)       Exercise Target  Goals: Exercise Program Goal: Individual exercise prescription set using results from initial 6 min walk test and THRR while considering  patient's activity barriers and safety.   Exercise Prescription Goal: Initial exercise prescription builds to 30-45 minutes a day of aerobic activity, 2-3 days per week.  Home exercise guidelines will be given to patient during program as part of exercise prescription that the participant will acknowledge.   Education: Aerobic Exercise: - Group verbal and visual presentation on the components of exercise prescription. Introduces F.I.T.T principle from ACSM for exercise prescriptions.  Reviews F.I.T.T. principles of aerobic exercise including progression. Written  material given at graduation. Flowsheet Row Cardiac Rehab from 01/27/2021 in [facility] Cardiac and Pulmonary Rehab  Date 01/06/21  Educator AS  Instruction Review Code 1- Verbalizes Understanding      Education: Resistance Exercise: - Group verbal and visual presentation on the components of exercise prescription. Introduces F.I.T.T principle from ACSM for exercise prescriptions  Reviews F.I.T.T. principles of resistance exercise including progression. Written material given at graduation. Flowsheet Row Cardiac Rehab from 01/27/2021 in [facility] Cardiac and Pulmonary Rehab  Date 01/13/21  Educator AS  Instruction Review Code 1- Verbalizes Understanding       Education: Exercise & Equipment Safety: - Individual verbal instruction and demonstration of equipment use and safety with use of the equipment. Flowsheet Row Cardiac Rehab from 01/27/2021 in [facility] Cardiac and Pulmonary Rehab  Date 12/02/20  Educator [facility]  Instruction Review Code 1- Verbalizes Understanding      Education: Exercise Physiology & General Exercise Guidelines: - Group verbal and written instruction with models to review the exercise physiology of the cardiovascular system and associated critical values. Provides general exercise guidelines with specific guidelines to those with heart or lung disease.  Flowsheet Row Cardiac Rehab from 01/27/2021 in [facility] Cardiac and Pulmonary Rehab  Date 12/30/20  Educator AS  Instruction Review Code 1- Verbalizes Understanding      Education: Flexibility, Balance, Mind/Body Relaxation: - Group verbal and visual presentation with interactive activity on the components of exercise prescription. Introduces F.I.T.T principle from ACSM for exercise prescriptions. Reviews F.I.T.T. principles of flexibility and balance exercise training including progression. Also discusses the mind body connection.  Reviews various relaxation techniques to help reduce and manage stress (i.e. Deep breathing, progressive  muscle relaxation, and visualization). Balance handout provided to take home. Written material given at graduation. Flowsheet Row Cardiac Rehab from 01/27/2021 in [facility] Cardiac and Pulmonary Rehab  Date 01/20/21  Educator AS  Instruction Review Code 1- Verbalizes Understanding      Activity Barriers & Risk Stratification:  Activity Barriers & Cardiac Risk Stratification - 12/02/20 0925      Activity Barriers & Cardiac Risk Stratification   Activity Barriers Other (comment);Deconditioning;Muscular Weakness    Comments groin pain from cath site    Cardiac Risk Stratification Moderate           6 Minute Walk:  6 Minute Walk    Row Name 12/02/20 0925 02/03/21 1624       6 Minute Walk   Phase Initial Discharge    Distance 1350 feet 1810 feet    Distance % Change -- 34 %    Distance Feet Change -- 460 ft    Walk Time 6 minutes 6 minutes    # of Rest Breaks 0 0    MPH 2.56 3.42    METS 4.66 5.57    RPE 7 11    Perceived Dyspnea  -- 0    VO2 Peak  16.31 19.52    Symptoms No No    Resting HR 99 bpm 100 bpm    Resting BP 120/64 100/72    Resting Oxygen Saturation  97 % 98 %    Exercise Oxygen Saturation  during 6 min walk 97 % --    Max Ex. HR 120 bpm 121 bpm    Max Ex. BP 142/80 152/72    2 Minute Post BP 122/60 --           Oxygen Initial Assessment:   Oxygen Re-Evaluation:   Oxygen Discharge (Final Oxygen Re-Evaluation):   Initial Exercise Prescription:  Initial Exercise Prescription - 12/02/20 0900      Date of Initial Exercise RX and Referring Provider   Date 12/02/20    Referring Provider [name] MD   Dr. Serafina Royals     Treadmill   MPH 2.5    Grade 2    Minutes 15    METs 3.6      Recumbant Elliptical   Level 3    RPM 50    Minutes 15    METs 3.5      Elliptical   Level 1    Speed 2.5    Minutes 15    METs 3.5      Prescription Details   Frequency (times per week) 3    Duration Progress to 30 minutes of continuous aerobic  without signs/symptoms of physical distress      Intensity   THRR 40-80% of Max Heartrate 131-164    Ratings of Perceived Exertion 11-13    Perceived Dyspnea 0-4      Progression   Progression Continue to progress workloads to maintain intensity without signs/symptoms of physical distress.      Resistance Training   Training Prescription Yes    Weight 3 lb    Reps 10-15           Perform Capillary Blood Glucose checks as needed.  Exercise Prescription Changes:  Exercise Prescription Changes    Row Name 12/02/20 0900 12/15/20 1300 12/28/20 1400 01/12/21 1300 01/26/21 1400     Response to Exercise   Blood Pressure (Admit) 120/64 122/64 122/68 120/60 124/62   Blood Pressure (Exercise) 142/80 164/66 154/76 128/64 162/82   Blood Pressure (Exit) 122/60 124/62 98/64 124/60 124/72   Heart Rate (Admit) 99 bpm 108 bpm 106 bpm 86 bpm 95 bpm   Heart Rate (Exercise) 120 bpm 138 bpm 148 bpm 142 bpm 114 bpm   Heart Rate (Exit) 112 bpm 115 bpm 77 bpm 111 bpm 107 bpm   Oxygen Saturation (Admit) 97 % -- -- -- --   Oxygen Saturation (Exercise) 97 % -- -- -- --   Rating of Perceived Exertion (Exercise) _0 Symptoms _1    Comments walk test results -- -- -- --   Duration -- Continue with 30 min of aerobic exercise without signs/symptoms of physical distress. Continue with 30 min of aerobic exercise without signs/symptoms of physical distress. Continue with 30 min of aerobic exercise without signs/symptoms of physical distress. Continue with 30 min of aerobic exercise without signs/symptoms of physical distress.   Intensity -- THRR unchanged THRR unchanged THRR unchanged THRR unchanged     Progression   Progression -- Continue to progress workloads to maintain intensity without signs/symptoms of physical distress. Continue to progress workloads to maintain intensity without signs/symptoms of physical distress. Continue to progress workloads to maintain intensity  without  signs/symptoms of physical distress. Continue to progress workloads to maintain intensity without signs/symptoms of physical distress.   Average METs -- 4.65 6.15 6.09 5.95     Resistance Training   Training Prescription -- Yes Yes Yes Yes   Weight -- 8lb 12 lb 12 12 lb   Reps -- 10-15 10-15 10-15 10-15     Interval Training   Interval Training -- -- No No No     Treadmill   MPH -- 3.5 4 3.7 3.5   Grade -- 2 7.5 9.5 12   Minutes -- _0 METs -- 4.65 9 8.68 9.5     Elliptical   Level -- 1 11 -- --   Speed -- -- 3.8 -- --   Minutes -- 15 15 -- --     REL-XR   Level -- -- 12 10 --   Minutes -- -- 15 15 --   METs -- -- 3.3 3.5 --     T5 Nustep   Level -- -- -- -- 6   Minutes -- -- -- -- 15   METs -- -- -- -- 2.4     Home Exercise Plan   Plans to continue exercise at -- -- Home (comment)  walking Home (comment)  walking Home (comment)  walking   Frequency -- -- Add 2 additional days to program exercise sessions. Add 2 additional days to program exercise sessions. Add 2 additional days to program exercise sessions.   Initial Home Exercises Provided -- -- 12/17/20 12/17/20 12/17/20          Exercise Comments:  Exercise Comments    Row Name 02/03/21 1642           Exercise Comments Oron graduated today from  rehab with 36 sessions completed.  Details of the patient's exercise prescription and what He needs to do in order to continue the prescription and progress were discussed with patient.  Patient was given a copy of prescription and goals.  Patient verbalized understanding.  Larene Beach plans to continue to exercise by joining the [facility].              Exercise Goals and Review:  Exercise Goals    Row Name 12/02/20 9371             Exercise Goals   Increase Physical Activity Yes       Intervention Provide advice, education, support and counseling about physical activity/exercise needs.;Develop an individualized exercise prescription for  aerobic and resistive training based on initial evaluation findings, risk stratification, comorbidities and participant's personal goals.       Expected Outcomes Short Term: Attend rehab on a regular basis to increase amount of physical activity.;Long Term: Add in home exercise to make exercise part of routine and to increase amount of physical activity.;Long Term: Exercising regularly at least 3-5 days a week.       Increase Strength and Stamina Yes       Intervention Provide advice, education, support and counseling about physical activity/exercise needs.;Develop an individualized exercise prescription for aerobic and resistive training based on initial evaluation findings, risk stratification, comorbidities and participant's personal goals.       Expected Outcomes Short Term: Increase workloads from initial exercise prescription for resistance, speed, and METs.;Short Term: Perform resistance training exercises routinely during rehab and add in resistance training at home;Long Term: Improve cardiorespiratory fitness, muscular endurance and strength as measured by increased METs and functional capacity (6MWT)  Able to understand and use rate of perceived exertion (RPE) scale Yes       Intervention Provide education and explanation on how to use RPE scale       Expected Outcomes Short Term: Able to use RPE daily in rehab to express subjective intensity level;Long Term:  Able to use RPE to guide intensity level when exercising independently       Able to understand and use Dyspnea scale Yes       Intervention Provide education and explanation on how to use Dyspnea scale       Expected Outcomes Short Term: Able to use Dyspnea scale daily in rehab to express subjective sense of shortness of breath during exertion;Long Term: Able to use Dyspnea scale to guide intensity level when exercising independently       Knowledge and understanding of Target Heart Rate Range (THRR) Yes       Intervention Provide  education and explanation of THRR including how the numbers were predicted and where they are located for reference       Expected Outcomes Short Term: Able to state/look up THRR;Short Term: Able to use daily as guideline for intensity in rehab;Long Term: Able to use THRR to govern intensity when exercising independently       Able to check pulse independently Yes       Intervention Provide education and demonstration on how to check pulse in carotid and radial arteries.;Review the importance of being able to check your own pulse for safety during independent exercise       Expected Outcomes Short Term: Able to explain why pulse checking is important during independent exercise;Long Term: Able to check pulse independently and accurately       Understanding of Exercise Prescription Yes       Intervention Provide education, explanation, and written materials on patient's individual exercise prescription       Expected Outcomes Short Term: Able to explain program exercise prescription;Long Term: Able to explain home exercise prescription to exercise independently              Exercise Goals Re-Evaluation :  Exercise Goals Re-Evaluation    Row Name 12/07/20 1718 12/15/20 1324 12/17/20 1637 12/24/20 1644 12/28/20 1449     Exercise Goal Re-Evaluation   Exercise Goals Review Able to understand and use rate of perceived exertion (RPE) scale;Increase Physical Activity;Knowledge and understanding of Target Heart Rate Range (THRR);Understanding of Exercise Prescription;Increase Strength and Stamina;Able to check pulse independently Increase Physical Activity;Increase Strength and Stamina Increase Physical Activity;Able to understand and use rate of perceived exertion (RPE) scale;Increase Strength and Stamina Increase Physical Activity;Able to understand and use rate of perceived exertion (RPE) scale;Increase Strength and Stamina Increase Physical Activity;Increase Strength and Stamina;Understanding of  Exercise Prescription   Comments Reviewed RPE and dyspnea scales, THR and program prescription with pt today.  Pt voiced understanding and was given a copy of goals to take home. Aaditya is progressing well with exercise.  He works in [employer] range and has attended consistently so far.  Staff willmonitor progress. Reviewed home exercise with pt today.  Pt plans to walk and consider joining a gym for exercise.  Reviewed THR, pulse, RPE, sign and symptoms, pulse oximetery and when to call 911 or MD.  Also discussed weather considerations and indoor options.  Pt voiced understanding. Re-reviewed home exercise precautions and protocols with patient. He has not done any exercise at home yet and was encouraged to do so. Talked about gradual progression  and monitor HR and symptoms. Korben is doing well in rehab. He has switched class to come in ealier to help with his work schedule.  He is up to 4 mph at 7.5% grade and doing well.  We will continue to montior his progress and talk about intervals   Expected Outcomes Short: Use RPE daily to regulate intensity. Long: Follow program prescription in THR. Short:  continue to attend consistently Long:  improve overall MET level Short: start to monitor HR when exercising at home Long:  maintain exercise independently Short: start to monitor HR when exercising at home Long:  maintain exercise independently Short: Talk about interval training Long: Continue to improve stamina   Row Name 01/12/21 1303 01/21/21 1610 01/26/21 1445         Exercise Goal Re-Evaluation   Exercise Goals Review Increase Physical Activity;Increase Strength and Stamina Increase Physical Activity;Increase Strength and Stamina Increase Physical Activity;Increase Strength and Stamina;Understanding of Exercise Prescription     Comments Ayaan attends consistently and works in [employer] range.  The elliptical remains challengiing for him. Larene Beach plans to join [employer] when he completes HT.  He plans to try Yoga  to help with stress management Chord is nearing graduation.  He should improve on his post 6MWT this week.  He is up to 12% grade on his treadmill.  We will continue to monitor his progress.     Expected Outcomes Short: add intervals Long: continue to build stamina Short: continue consistent exercise Long:  build overall stamina Short: Improve post 6MWT long: Get involved into gym on his own            Discharge Exercise Prescription (Final Exercise Prescription Changes):  Exercise Prescription Changes - 01/26/21 1400      Response to Exercise   Blood Pressure (Admit) 124/62    Blood Pressure (Exercise) 162/82    Blood Pressure (Exit) 124/72    Heart Rate (Admit) 95 bpm    Heart Rate (Exercise) 114 bpm    Heart Rate (Exit) 107 bpm    Rating of Perceived Exertion (Exercise) 15    Symptoms none    Duration Continue with 30 min of aerobic exercise without signs/symptoms of physical distress.    Intensity THRR unchanged      Progression   Progression Continue to progress workloads to maintain intensity without signs/symptoms of physical distress.    Average METs 5.95      Resistance Training   Training Prescription Yes    Weight 12 lb    Reps 10-15      Interval Training   Interval Training No      Treadmill   MPH 3.5    Grade 12    Minutes 15    METs 9.5      T5 Nustep   Level 6    Minutes 15    METs 2.4      Home Exercise Plan   Plans to continue exercise at Home (comment)   walking   Frequency Add 2 additional days to program exercise sessions.    Initial Home Exercises Provided 12/17/20           Nutrition:  Target Goals: Understanding of nutrition guidelines, daily intake of sodium <1527m, cholesterol <2083m calories 30% from fat and 7% or less from saturated fats, daily to have 5 or more servings of fruits and vegetables.  Education: All About Nutrition: -Group instruction provided by verbal, written material, interactive activities, discussions,  models, and posters to present  general guidelines for heart healthy nutrition including fat, fiber, MyPlate, the role of sodium in heart healthy nutrition, utilization of the nutrition label, and utilization of this knowledge for meal planning. Follow up email sent as well. Written material given at graduation. Flowsheet Row Cardiac Rehab from 01/27/2021 in [facility] Cardiac and Pulmonary Rehab  Education need identified 12/02/20  Date 01/27/21  Educator [place]  Instruction Review Code 1- Verbalizes Understanding      Biometrics:  Pre Biometrics - 12/02/20 0928      Pre Biometrics   Height 6' 5.1" (1.958 m)    Weight 276 lb (125.2 kg)    BMI (Calculated) 32.66    Single Leg Stand 30 seconds            Nutrition Therapy Plan and Nutrition Goals:  Nutrition Therapy & Goals - 01/25/21 1527      Nutrition Therapy   Diet Heart healthy, low Na, Diabetes friendly    Drug/Food Interactions Statins/Certain Fruits    Protein (specify units) 100g    Fiber 30 grams    Whole Grain Foods 3 servings    Saturated Fats 12 max. grams    Fruits and Vegetables 8 servings/day    Sodium 1.5 grams      Personal Nutrition Goals   Nutrition Goal ST: drink water during exercise and 1/2 bottle of water during the day (crystal lite), practice sructuring dinner using MyPlate LT: Structure meals using MyPlate, drink mostly water, go out to eat <3x/week    Comments B: oatmeal with brown sugar L: out to eat for lunch - [place], subway sandwich, buffalo wild wings D: whatever his mom makes: chicken cordon bleu, pita chips and tzatziki sauce. He has diet soda and crystal lite in water. Discussed heart healthy eating and diabetes friendly eating.      Intervention Plan   Intervention Prescribe, educate and counsel regarding individualized specific dietary modifications aiming towards targeted core components such as weight, hypertension, lipid management, diabetes, heart failure and other comorbidities.;Nutrition  handout(s) given to patient.    Expected Outcomes Short Term Goal: Understand basic principles of dietary content, such as calories, fat, sodium, cholesterol and nutrients.;Short Term Goal: A plan has been developed with personal nutrition goals set during dietitian appointment.;Long Term Goal: Adherence to prescribed nutrition plan.           Nutrition Assessments:  MEDIFICTS Score Key:  ?70 Need to make dietary changes   40-70 Heart Healthy Diet  ? 40 Therapeutic Level Cholesterol Diet  Flowsheet Row Cardiac Rehab from 01/28/2021 in [facility] Cardiac and Pulmonary Rehab  Picture Your Plate Total Score on Admission 70  Picture Your Plate Total Score on Discharge 50     Picture Your Plate Scores:  <16 Unhealthy dietary pattern with much room for improvement.  41-50 Dietary pattern unlikely to meet recommendations for good health and room for improvement.  51-60 More healthful dietary pattern, with some room for improvement.   >60 Healthy dietary pattern, although there may be some specific behaviors that could be improved.    Nutrition Goals Re-Evaluation:  Nutrition Goals Re-Evaluation    Row Name 12/24/20 1615             Goals   Comment Antaeus has not yet met with the RD to establish goals.       Expected Outcome Short: Meet with RD Long: Maintain heart healthy diet              Nutrition Goals Discharge (Final Nutrition  Goals Re-Evaluation):  Nutrition Goals Re-Evaluation - 12/24/20 1615      Goals   Comment Yassin has not yet met with the RD to establish goals.    Expected Outcome Short: Meet with RD Long: Maintain heart healthy diet           Psychosocial: Target Goals: Acknowledge presence or absence of significant depression and/or stress, maximize coping skills, provide positive support system. Participant is able to verbalize types and ability to use techniques and skills needed for reducing stress and depression.   Education: Stress, Anxiety, and  Depression - Group verbal and visual presentation to define topics covered.  Reviews how body is impacted by stress, anxiety, and depression.  Also discusses healthy ways to reduce stress and to treat/manage anxiety and depression.  Written material given at graduation. Flowsheet Row Cardiac Rehab from 01/27/2021 in [facility] Cardiac and Pulmonary Rehab  Date 12/23/20  Educator AS  Instruction Review Code 1- Verbalizes Understanding      Education: Sleep Hygiene -Provides group verbal and written instruction about how sleep can affect your health.  Define sleep hygiene, discuss sleep cycles and impact of sleep habits. Review good sleep hygiene tips.    Initial Review & Psychosocial Screening:  Initial Psych Review & Screening - 11/25/20 1041      Initial Review   Current issues with History of Depression      Family Dynamics   Good Support System? Yes      Barriers   Psychosocial barriers to participate in program There are no identifiable barriers or psychosocial needs.;The patient should benefit from training in stress management and relaxation.      Screening Interventions   Interventions Encouraged to exercise;Provide feedback about the scores to participant;To provide support and resources with identified psychosocial needs    Expected Outcomes Short Term goal: Utilizing psychosocial counselor, staff and physician to assist with identification of specific Stressors or current issues interfering with healing process. Setting desired goal for each stressor or current issue identified.;Long Term Goal: Stressors or current issues are controlled or eliminated.;Short Term goal: Identification and review with participant of any Quality of Life or Depression concerns found by scoring the questionnaire.;Long Term goal: The participant improves quality of Life and PHQ9 Scores as seen by post scores and/or verbalization of changes           Quality of Life Scores:   Quality of Life - 02/01/21  1036      Quality of Life Scores   Health/Function Pre 29.2 %    Health/Function Post 26.53 %    Health/Function % Change -9.14 %    Socioeconomic Pre 27 %    Socioeconomic Post 23.64 %    Socioeconomic % Change  -12.44 %    Psych/Spiritual Pre 27.86 %    Psych/Spiritual Post 30 %    Psych/Spiritual % Change 7.68 %    Family Pre 22.8 %    Family Post 21.33 %    Family % Change -6.45 %    GLOBAL Pre 27.91 %    GLOBAL Post 26.05 %    GLOBAL % Change -6.66 %          Scores of 19 and below usually indicate a poorer quality of life in these areas.  A difference of  2-3 points is a clinically meaningful difference.  A difference of 2-3 points in the total score of the Quality of Life Index has been associated with significant improvement in overall quality of life,  self-image, physical symptoms, and general health in studies assessing change in quality of life.  PHQ-9: Recent Review Flowsheet Data    Depression screen [employer] 2/9 02/01/2021 12/02/2020 06/19/2020 04/17/2019 06/15/2018   Decreased Interest 0 0 0 0 0   Down, Depressed, Hopeless 0 0 0 0 0   PHQ - 2 Score 0 0 0 0 0   Altered sleeping 0 0 - 0 -   Tired, decreased energy 0 0 - 0 -   Change in appetite 0 0 - 0 -   Feeling bad or failure about yourself  0 0 - 0 -   Trouble concentrating 0 0 - 0 -   Moving slowly or fidgety/restless 0 0 - 0 -   Suicidal thoughts 0 0 - 0 -   PHQ-9 Score 0 0 - 0 -   Difficult doing work/chores Not difficult at all Not difficult at all - - -     Interpretation of Total Score  Total Score Depression Severity:  1-4 = Minimal depression, 5-9 = Mild depression, 10-14 = Moderate depression, 15-19 = Moderately severe depression, 20-27 = Severe depression   Psychosocial Evaluation and Intervention:  Psychosocial Evaluation - 11/25/20 1048      Psychosocial Evaluation & Interventions   Interventions Encouraged to exercise with the program and follow exercise prescription    Comments Benji reports  doing well after his NSTEMI. His only complaint is his groin pain post cath if he over does it. He did state he had a history of depression during his last job a while ago, but is doing much better mentally and phsyically now. He is already back to work which is going smoothly and is "low stress." He is currently trying to work on finding two alternative medications to replace Newell since they are so expensive. Besides that, he reported no sleep or current stress issues. He is hoping this program will help him stay on track of a heart healthy lifestyle.    Expected Outcomes Short: attend cardiac rehab for education and exercise. Long: develop and maintain positive self care habits.    Continue Psychosocial Services  Follow up required by staff           Psychosocial Re-Evaluation:  Psychosocial Re-Evaluation    Yoakum Name 12/24/20 1625 01/21/21 1607           Psychosocial Re-Evaluation   Current issues with Current Sleep Concerns;Current Stress Concerns Current Stress Concerns      Comments Kendryck is holding up well mentally. He stated he has family stress as well as financial stress, though declined to go into detail. He is at least enjoying coming to rehab. Stressed importance about home exercise and utilizing exercise to help with stress management. He is on Citalopram and feels like it is working well. Taking prn medication for anxiety which he hasn't needed.  Doesn't sleep the best- amount of hours of sleep vary every night . Doctor wants to do sleep study, but he did not want to proceed any further after talking about it. Talking to doctor later this month about a possible CPAP, though he is not too happy about the idea. Most if the reason is due to insurance reasons. Told him to discuss with his doctor about other alternatives for programs that can help financially. He has an appointment to talk to doctor about it later this month. Ky is still taking Citalopram. He does  feel exercise helps with stress.  He will consider Mind  Body exercise for stress as well - plans to join [employer] when he graduates [employer]      Expected Outcomes Short: Talk to doctor about possible CPAP/sleep study Long: Continue to use exercise for stress management and maintain positive attitude Short: continue to exercise to manage stress Long:  use medication and exercise to manage stress      Interventions Encouraged to attend Cardiac Rehabilitation for the exercise --      Continue Psychosocial Services  Follow up required by staff --             Psychosocial Discharge (Final Psychosocial Re-Evaluation):  Psychosocial Re-Evaluation - 01/21/21 1607      Psychosocial Re-Evaluation   Current issues with Current Stress Concerns    Comments Kalin is still taking Citalopram. He does feel exercise helps with stress.  He will consider Mind Body exercise for stress as well - plans to join [facility] [employer] when he graduates [employer]    Expected Outcomes Short: continue to exercise to manage stress Long:  use medication and exercise to manage stress           Vocational Rehabilitation: Provide vocational rehab assistance to qualifying candidates.   Vocational Rehab Evaluation & Intervention:  Vocational Rehab - 02/01/21 1038      Initial Vocational Rehab Evaluation & Intervention   Assessment shows need for Vocational Rehabilitation No           Education: Education Goals: Education classes will be provided on a variety of topics geared toward better understanding of heart health and risk factor modification. Participant will state understanding/return demonstration of topics presented as noted by education test scores.  Learning Barriers/Preferences:  Learning Barriers/Preferences - 11/25/20 1046      Learning Barriers/Preferences   Learning Barriers None    Learning Preferences None           General Cardiac Education Topics:  AED/CPR: - Group verbal and written instruction with the  use of models to demonstrate the basic use of the AED with the basic ABC's of resuscitation.   Anatomy and Cardiac Procedures: - Group verbal and visual presentation and models provide information about basic cardiac anatomy and function. Reviews the testing methods done to diagnose heart disease and the outcomes of the test results. Describes the treatment choices: Medical Management, Angioplasty, or Coronary Bypass Surgery for treating various heart conditions including Myocardial Infarction, Angina, Valve Disease, and Cardiac Arrhythmias.  Written material given at graduation. Flowsheet Row Cardiac Rehab from 01/27/2021 in [facility] Cardiac and Pulmonary Rehab  Education need identified 12/02/20  Date 01/13/21  Educator [facility]  Instruction Review Code 1- Verbalizes Understanding      Medication Safety: - Group verbal and visual instruction to review commonly prescribed medications for heart and lung disease. Reviews the medication, class of the drug, and side effects. Includes the steps to properly store meds and maintain the prescription regimen.  Written material given at graduation.   Intimacy: - Group verbal instruction through game format to discuss how heart and lung disease can affect sexual intimacy. Written material given at graduation.. Flowsheet Row Cardiac Rehab from 01/27/2021 in [facility] Cardiac and Pulmonary Rehab  Date 01/06/21  Educator [facility]  Instruction Review Code 1- Verbalizes Understanding      Know Your Numbers and Heart Failure: - Group verbal and visual instruction to discuss disease risk factors for cardiac and pulmonary disease and treatment options.  Reviews associated critical values for Overweight/Obesity, Hypertension, Cholesterol, and Diabetes.  Discusses basics of heart failure:  signs/symptoms and treatments.  Introduces Heart Failure Zone chart for action plan for heart failure.  Written material given at graduation. Flowsheet Row Cardiac Rehab from 01/27/2021 in [facility]  Cardiac and Pulmonary Rehab  Education need identified 12/02/20  Date 12/09/20  Educator [facility]  Instruction Review Code 1- Verbalizes Understanding      Infection Prevention: - Provides verbal and written material to individual with discussion of infection control including proper hand washing and proper equipment cleaning during exercise session. Flowsheet Row Cardiac Rehab from 01/27/2021 in [facility] Cardiac and Pulmonary Rehab  Date 12/02/20  Educator [facility]  Instruction Review Code 1- Verbalizes Understanding      Falls Prevention: - Provides verbal and written material to individual with discussion of falls prevention and safety. Flowsheet Row Cardiac Rehab from 01/27/2021 in [facility] Cardiac and Pulmonary Rehab  Date 12/02/20  Educator [facility]  Instruction Review Code 1- Verbalizes Understanding      Other: -Provides group and verbal instruction on various topics (see comments)   Knowledge Questionnaire Score:  Knowledge Questionnaire Score - 02/01/21 1037      Knowledge Questionnaire Score   Pre Score 23/26 Education Focus: nutrition, heart failure, angina    Post Score 23/26           Core Components/Risk Factors/Patient Goals at Admission:  Personal Goals and Risk Factors at Admission - 12/02/20 0930      Core Components/Risk Factors/Patient Goals on Admission    Weight Management Yes;Obesity;Weight Loss    Intervention Weight Management: Develop a combined nutrition and exercise program designed to reach desired caloric intake, while maintaining appropriate intake of nutrient and fiber, sodium and fats, and appropriate energy expenditure required for the weight goal.;Weight Management: Provide education and appropriate resources to help participant work on and attain dietary goals.;Weight Management/Obesity: Establish reasonable short term and long term weight goals.;Obesity: Provide education and appropriate resources to help participant work on and attain dietary goals.    Admit  Weight 276 lb (125.2 kg)    Goal Weight: Short Term 270 lb (122.5 kg)    Goal Weight: Long Term 265 lb (120.2 kg)    Expected Outcomes Short Term: Continue to assess and modify interventions until short term weight is achieved;Long Term: Adherence to nutrition and physical activity/exercise program aimed toward attainment of established weight goal;Weight Loss: Understanding of general recommendations for a balanced deficit meal plan, which promotes 1-2 lb weight loss per week and includes a negative energy balance of [phone] kcal/d;Understanding of distribution of calorie intake throughout the day with the consumption of 4-5 meals/snacks;Understanding recommendations for meals to include 15-35% energy as protein, 25-35% energy from fat, 35-60% energy from carbohydrates, less than 266m of dietary cholesterol, 20-35 gm of total fiber daily    Diabetes Yes    Intervention Provide education about signs/symptoms and action to take for hypo/hyperglycemia.;Provide education about proper nutrition, including hydration, and aerobic/resistive exercise prescription along with prescribed medications to achieve blood glucose in normal ranges: Fasting glucose 65-99 mg/dL    Expected Outcomes Short Term: Participant verbalizes understanding of the signs/symptoms and immediate care of hyper/hypoglycemia, proper foot care and importance of medication, aerobic/resistive exercise and nutrition plan for blood glucose control.;Long Term: Attainment of HbA1C < 7%.    Hypertension Yes    Intervention Provide education on lifestyle modifcations including regular physical activity/exercise, weight management, moderate sodium restriction and increased consumption of fresh fruit, vegetables, and low fat dairy, alcohol moderation, and smoking cessation.;Monitor prescription use compliance.    Expected Outcomes Short  Term: Continued assessment and intervention until BP is < 140/66m HG in hypertensive participants. < 130/873mHG in  hypertensive participants with diabetes, heart failure or chronic kidney disease.;Long Term: Maintenance of blood pressure at goal levels.    Lipids Yes    Intervention Provide education and support for participant on nutrition & aerobic/resistive exercise along with prescribed medications to achieve LDL <7080mHDL >71m56m  Expected Outcomes Short Term: Participant states understanding of desired cholesterol values and is compliant with medications prescribed. Participant is following exercise prescription and nutrition guidelines.;Long Term: Cholesterol controlled with medications as prescribed, with individualized exercise RX and with personalized nutrition plan. Value goals: LDL < 70mg7mL > 40 mg.           Education:Diabetes - Individual verbal and written instruction to review signs/symptoms of diabetes, desired ranges of glucose level fasting, after meals and with exercise. Acknowledge that pre and post exercise glucose checks will be done for 3 sessions at entry of program. [facility] [place] [facility]  Date 11/25/20  Educator [facility] Review Code 1- Verbalizes Understanding      Core Components/Risk Factors/Patient Goals Review:   Goals and Risk Factor Review    Row Name 12/24/20 1616 01/21/21 1602           Core Components/Risk Factors/Patient Goals Review   Personal Goals Review Weight Management/Obesity;Hypertension;Lipids;Diabetes Weight Management/Obesity;Hypertension;Lipids;Diabetes      Review ShannJamani't been monitoring too much at home. He does weigh himself but admits not as often as he should, he does have a scale at home. Stressed importance on sudden weight gain. Patient reports "he has no goals for his weight" at this time. He doesn't take BP at home- he reports it fluctuates all over, though it has been pretty stable here at rehab. Encouraged him to take his BP more often and starting logging it at home,  he does have a cuff to use. He is meeting with RD to establish nutrition goals. Sugars are high, around 160- 200 in the morning- staying compliant with all medications. Doctor hasn't gave him a goal for his sugars per patient.  He is trying to cut out sweets. Also talked about the importance of exercise with helping to regulate blood sugars. He used to eat out a lot and is also trying to cut that down as much as he can. Encouraged to start logging BP, weight, and BS. ShannPacertil not consistently monitoring BP at home.  He will try to check at least once per week. Resting BP 104/60 today in class.  He doesnt consistently check BG.  He will also try to check this once a week.  He still eats out a lot.  We reviewed having vegetables or a salad as a side instead of fries as a healthier option eating out.      Expected Outcomes Short: Start log to monitor at home (HR, BP, sugar, weight, etc.). Long: Continue to manage lifestyle risk factors Short: check BP and BG once per week at home Long: manage risk factors long term             Core Components/Risk Factors/Patient Goals at Discharge (Final Review):   Goals and Risk Factor Review - 01/21/21 1602      Core Components/Risk Factors/Patient Goals Review   Personal Goals Review Weight Management/Obesity;Hypertension;Lipids;Diabetes    Review ShannDorentil not consistently monitoring BP at home.  He will try to  check at least once per week. Resting BP 104/60 today in class.  He doesnt consistently check BG.  He will also try to check this once a week.  He still eats out a lot.  We reviewed having vegetables or a salad as a side instead of fries as a healthier option eating out.    Expected Outcomes Short: check BP and BG once per week at home Long: manage risk factors long term           ITP Comments:  ITP Comments    Row Name 11/25/20 1055 12/02/20 0924 12/07/20 1718 12/09/20 1130 01/06/21 0642   ITP Comments Initial telephone orientation  completed. Diagnosis can be found in [facility] 3/2. EP orientation scheduled for Wednesday 3/16 at 8am. Completed 6MWT and gym orientation. Initial ITP created and sent for review to Dr. Emily Filbert, Medical Director. First full day of exercise!  Patient was oriented to gym and equipment including functions, settings, policies, and procedures.  Patient's individual exercise prescription and treatment plan were reviewed.  All starting workloads were established based on the results of the 6 minute walk test done at initial orientation visit.  The plan for exercise progression was also introduced and progression will be customized based on patient's performance and goals. 30 Day review completed. Medical Director ITP review done, changes made as directed, and signed approval by Medical Director.  New to program 30 Day review completed. Medical Director ITP review done, changes made as directed, and signed approval by Medical Director.   Carmi Name 02/03/21 1000 02/03/21 1642         ITP Comments 30 Day review completed. Medical Director ITP review done, changes made as directed, and signed approval by Medical Director. Jamauri graduated today from  rehab with 36 sessions completed.  Details of the patient's exercise prescription and what He needs to do in order to continue the prescription and progress were discussed with patient.  Patient was given a copy of prescription and goals.  Patient verbalized understanding.  Larene Beach plans to continue to exercise by joining the [facility]             Comments: discharge ITP

## 2021-02-03 NOTE — Progress Notes (Signed)
Cardiac Individual Treatment Plan  Patient Details  Name: James [name] MRN: [id number] Date of Birth: July 05, 1981 Referring Provider:   Flowsheet Row Cardiac Rehab from 12/02/2020 in [facility] Cardiac and Pulmonary Rehab  Referring Provider Paraschos, Alexander MD  [Dr. Serafina Royals      Initial Encounter Date:  Flowsheet Row Cardiac Rehab from 12/02/2020 in [facility] Cardiac and Pulmonary Rehab  Date 12/02/20      Visit Diagnosis: Status post coronary artery stent placement  NSTEMI (non-ST elevated myocardial infarction) [facility])  Patient's Home Medications on Admission:  Current Outpatient Medications:  .  aspirin EC 81 MG EC tablet, Take 1 tablet (81 mg total) by mouth daily. Swallow whole., Disp: 30 tablet, Rfl: 11 .  atorvastatin (LIPITOR) 80 MG tablet, Take 1 tablet (80 mg total) by mouth at bedtime., Disp: 30 tablet, Rfl: 1 .  citalopram (CELEXA) 20 MG tablet, TAKE ONE TABLET BY MOUTH DAILY, Disp: 90 tablet, Rfl: 1 .  clopidogrel (PLAVIX) 75 MG tablet, Take 1 tablet (75 mg total) by mouth daily., Disp: 90 tablet, Rfl: 1 .  glimepiride (AMARYL) 2 MG tablet, Take 1 tablet (2 mg total) by mouth daily with breakfast. Take 1 tablet by mouth once daily with breakfast, Disp: 30 tablet, Rfl: 1 .  insulin glargine (LANTUS SOLOSTAR) 100 UNIT/ML Solostar Pen, Inject 10 Units into the skin daily., Disp: 15 mL, Rfl: 1 .  lisinopril (ZESTRIL) 5 MG tablet, Take 1 tablet (5 mg total) by mouth daily., Disp: 90 tablet, Rfl: 3 .  metFORMIN (GLUCOPHAGE) 1000 MG tablet, TAKE 1 TABLET BY MOUTH TWICE DAILY WITH MEALS (Patient taking differently: Take 1,000 mg by mouth 2 (two) times daily with a meal.), Disp: 180 tablet, Rfl: 3 .  metoprolol succinate (TOPROL XL) 25 MG 24 hr tablet, Take 1.5 tablets (37.5 mg total) by mouth daily., Disp: 45 tablet, Rfl: 6 .  Multiple Vitamin (MULTIVITAMIN ADULT) TABS, Take 1 tablet by mouth daily. Nature's nutrition Blood sugar support - with cinnamon, chromium, and ALA,  Disp: , Rfl:  .  Omega-3 Fatty Acids (FISH OIL) 1200 MG CAPS, Take 1 capsule by mouth in the morning and at bedtime., Disp: , Rfl:  .  vitamin B-12 (V-R VITAMIN B-12) 500 MCG tablet, Take 1 tablet (500 mcg total) by mouth daily., Disp: , Rfl:   Past Medical History: Past Medical History:  Diagnosis Date  . Diabetes type 2, controlled ([place])   . History of asthma childhood   dust mites  . History of pneumonia 09/2014   LLL  . HTN (hypertension)   . Major depressive disorder, recurrent episode, moderate ([place]) 12/26/2015  . Mixed hyperlipidemia   . NAFLD (nonalcoholic fatty liver disease)   . Obesity   . Suicidal thoughts 12/25/2015   s/p ER evaluation    Tobacco Use: Social History   Tobacco Use  Smoking Status Never Smoker  Smokeless Tobacco Never Used    Labs: Recent Review Flowsheet Data    Labs for ITP Cardiac and Pulmonary Rehab Latest Ref Rng & Units 07/29/2019 11/26/2019 02/28/2020 06/09/2020 01/13/2021   Cholestrol 0 - 200 mg/dL - - - 149 -   LDLCALC 0 - 99 mg/dL - - - - -   LDLDIRECT mg/dL - - - 76.0 -   HDL >39.00 mg/dL - - - 26.60(L) -   Trlycerides 0.0 - 149.0 mg/dL - - - 351.0(H) -   Hemoglobin A1c 4.0 - 5.6 % 7.7(A) 9.0(A) 8.0(A) 7.2(H) 8.4(A)       Exercise Target  Goals: Exercise Program Goal: Individual exercise prescription set using results from initial 6 min walk test and THRR while considering  patient's activity barriers and safety.   Exercise Prescription Goal: Initial exercise prescription builds to 30-45 minutes a day of aerobic activity, 2-3 days per week.  Home exercise guidelines will be given to patient during program as part of exercise prescription that the participant will acknowledge.   Education: Aerobic Exercise: - Group verbal and visual presentation on the components of exercise prescription. Introduces F.I.T.T principle from ACSM for exercise prescriptions.  Reviews F.I.T.T. principles of aerobic exercise including progression. Written  material given at graduation. Flowsheet Row Cardiac Rehab from 01/27/2021 in [facility] Cardiac and Pulmonary Rehab  Date 01/06/21  Educator AS  Instruction Review Code 1- Verbalizes Understanding      Education: Resistance Exercise: - Group verbal and visual presentation on the components of exercise prescription. Introduces F.I.T.T principle from ACSM for exercise prescriptions  Reviews F.I.T.T. principles of resistance exercise including progression. Written material given at graduation. Flowsheet Row Cardiac Rehab from 01/27/2021 in [facility] Cardiac and Pulmonary Rehab  Date 01/13/21  Educator AS  Instruction Review Code 1- Verbalizes Understanding       Education: Exercise & Equipment Safety: - Individual verbal instruction and demonstration of equipment use and safety with use of the equipment. Flowsheet Row Cardiac Rehab from 01/27/2021 in [facility] Cardiac and Pulmonary Rehab  Date 12/02/20  Educator [facility]  Instruction Review Code 1- Verbalizes Understanding      Education: Exercise Physiology & General Exercise Guidelines: - Group verbal and written instruction with models to review the exercise physiology of the cardiovascular system and associated critical values. Provides general exercise guidelines with specific guidelines to those with heart or lung disease.  Flowsheet Row Cardiac Rehab from 01/27/2021 in [facility] Cardiac and Pulmonary Rehab  Date 12/30/20  Educator AS  Instruction Review Code 1- Verbalizes Understanding      Education: Flexibility, Balance, Mind/Body Relaxation: - Group verbal and visual presentation with interactive activity on the components of exercise prescription. Introduces F.I.T.T principle from ACSM for exercise prescriptions. Reviews F.I.T.T. principles of flexibility and balance exercise training including progression. Also discusses the mind body connection.  Reviews various relaxation techniques to help reduce and manage stress (i.e. Deep breathing, progressive  muscle relaxation, and visualization). Balance handout provided to take home. Written material given at graduation. Flowsheet Row Cardiac Rehab from 01/27/2021 in [facility] Cardiac and Pulmonary Rehab  Date 01/20/21  Educator AS  Instruction Review Code 1- Verbalizes Understanding      Activity Barriers & Risk Stratification:  Activity Barriers & Cardiac Risk Stratification - 12/02/20 0925      Activity Barriers & Cardiac Risk Stratification   Activity Barriers Other (comment);Deconditioning;Muscular Weakness    Comments groin pain from cath site    Cardiac Risk Stratification Moderate           6 Minute Walk:  6 Minute Walk    Row Name 12/02/20 0925         6 Minute Walk   Phase Initial     Distance 1350 feet     Walk Time 6 minutes     # of Rest Breaks 0     MPH 2.56     METS 4.66     RPE 7     VO2 Peak 16.31     Symptoms No     Resting HR 99 bpm     Resting BP 120/64     Resting Oxygen  Saturation  97 %     Exercise Oxygen Saturation  during 6 min walk 97 %     Max Ex. HR 120 bpm     Max Ex. BP 142/80     2 Minute Post BP 122/60            Oxygen Initial Assessment:   Oxygen Re-Evaluation:   Oxygen Discharge (Final Oxygen Re-Evaluation):   Initial Exercise Prescription:  Initial Exercise Prescription - 12/02/20 0900      Date of Initial Exercise RX and Referring Provider   Date 12/02/20    Referring Provider [name] MD   Dr. Serafina Royals     Treadmill   MPH 2.5    Grade 2    Minutes 15    METs 3.6      Recumbant Elliptical   Level 3    RPM 50    Minutes 15    METs 3.5      Elliptical   Level 1    Speed 2.5    Minutes 15    METs 3.5      Prescription Details   Frequency (times per week) 3    Duration Progress to 30 minutes of continuous aerobic without signs/symptoms of physical distress      Intensity   THRR 40-80% of Max Heartrate 131-164    Ratings of Perceived Exertion 11-13    Perceived Dyspnea 0-4       Progression   Progression Continue to progress workloads to maintain intensity without signs/symptoms of physical distress.      Resistance Training   Training Prescription Yes    Weight 3 lb    Reps 10-15           Perform Capillary Blood Glucose checks as needed.  Exercise Prescription Changes:  Exercise Prescription Changes    Row Name 12/02/20 0900 12/15/20 1300 12/28/20 1400 01/12/21 1300 01/26/21 1400     Response to Exercise   Blood Pressure (Admit) 120/64 122/64 122/68 120/60 124/62   Blood Pressure (Exercise) 142/80 164/66 154/76 128/64 162/82   Blood Pressure (Exit) 122/60 124/62 98/64 124/60 124/72   Heart Rate (Admit) 99 bpm 108 bpm 106 bpm 86 bpm 95 bpm   Heart Rate (Exercise) 120 bpm 138 bpm 148 bpm 142 bpm 114 bpm   Heart Rate (Exit) 112 bpm 115 bpm 77 bpm 111 bpm 107 bpm   Oxygen Saturation (Admit) 97 % -- -- -- --   Oxygen Saturation (Exercise) 97 % -- -- -- --   Rating of Perceived Exertion (Exercise) _0 Symptoms _1    Comments walk test results -- -- -- --   Duration -- Continue with 30 min of aerobic exercise without signs/symptoms of physical distress. Continue with 30 min of aerobic exercise without signs/symptoms of physical distress. Continue with 30 min of aerobic exercise without signs/symptoms of physical distress. Continue with 30 min of aerobic exercise without signs/symptoms of physical distress.   Intensity -- THRR unchanged THRR unchanged THRR unchanged THRR unchanged     Progression   Progression -- Continue to progress workloads to maintain intensity without signs/symptoms of physical distress. Continue to progress workloads to maintain intensity without signs/symptoms of physical distress. Continue to progress workloads to maintain intensity without signs/symptoms of physical distress. Continue to progress workloads to maintain intensity without signs/symptoms of physical distress.   Average METs -- 4.65 6.15 6.09  5.95     Resistance Training  Training Prescription -- Yes Yes Yes Yes   Weight -- 8lb 12 lb 12 12 lb   Reps -- 10-15 10-15 10-15 10-15     Interval Training   Interval Training -- -- No No No     Treadmill   MPH -- 3.5 4 3.7 3.5   Grade -- 2 7.5 9.5 12   Minutes -- _0 METs -- 4.65 9 8.68 9.5     Elliptical   Level -- 1 11 -- --   Speed -- -- 3.8 -- --   Minutes -- 15 15 -- --     REL-XR   Level -- -- 12 10 --   Minutes -- -- 15 15 --   METs -- -- 3.3 3.5 --     T5 Nustep   Level -- -- -- -- 6   Minutes -- -- -- -- 15   METs -- -- -- -- 2.4     Home Exercise Plan   Plans to continue exercise at -- -- Home (comment)  walking Home (comment)  walking Home (comment)  walking   Frequency -- -- Add 2 additional days to program exercise sessions. Add 2 additional days to program exercise sessions. Add 2 additional days to program exercise sessions.   Initial Home Exercises Provided -- -- 12/17/20 12/17/20 12/17/20          Exercise Comments:   Exercise Goals and Review:  Exercise Goals    Row Name 12/02/20 0927             Exercise Goals   Increase Physical Activity Yes       Intervention Provide advice, education, support and counseling about physical activity/exercise needs.;Develop an individualized exercise prescription for aerobic and resistive training based on initial evaluation findings, risk stratification, comorbidities and participant's personal goals.       Expected Outcomes Short Term: Attend rehab on a regular basis to increase amount of physical activity.;Long Term: Add in home exercise to make exercise part of routine and to increase amount of physical activity.;Long Term: Exercising regularly at least 3-5 days a week.       Increase Strength and Stamina Yes       Intervention Provide advice, education, support and counseling about physical activity/exercise needs.;Develop an individualized exercise prescription for aerobic and resistive  training based on initial evaluation findings, risk stratification, comorbidities and participant's personal goals.       Expected Outcomes Short Term: Increase workloads from initial exercise prescription for resistance, speed, and METs.;Short Term: Perform resistance training exercises routinely during rehab and add in resistance training at home;Long Term: Improve cardiorespiratory fitness, muscular endurance and strength as measured by increased METs and functional capacity (6MWT)       Able to understand and use rate of perceived exertion (RPE) scale Yes       Intervention Provide education and explanation on how to use RPE scale       Expected Outcomes Short Term: Able to use RPE daily in rehab to express subjective intensity level;Long Term:  Able to use RPE to guide intensity level when exercising independently       Able to understand and use Dyspnea scale Yes       Intervention Provide education and explanation on how to use Dyspnea scale       Expected Outcomes Short Term: Able to use Dyspnea scale daily in rehab to express subjective sense of shortness of breath during exertion;Long Term:  Able to use Dyspnea scale to guide intensity level when exercising independently       Knowledge and understanding of Target Heart Rate Range (THRR) Yes       Intervention Provide education and explanation of THRR including how the numbers were predicted and where they are located for reference       Expected Outcomes Short Term: Able to state/look up THRR;Short Term: Able to use daily as guideline for intensity in rehab;Long Term: Able to use THRR to govern intensity when exercising independently       Able to check pulse independently Yes       Intervention Provide education and demonstration on how to check pulse in carotid and radial arteries.;Review the importance of being able to check your own pulse for safety during independent exercise       Expected Outcomes Short Term: Able to explain why pulse  checking is important during independent exercise;Long Term: Able to check pulse independently and accurately       Understanding of Exercise Prescription Yes       Intervention Provide education, explanation, and written materials on patient's individual exercise prescription       Expected Outcomes Short Term: Able to explain program exercise prescription;Long Term: Able to explain home exercise prescription to exercise independently              Exercise Goals Re-Evaluation :  Exercise Goals Re-Evaluation    Row Name 12/07/20 1718 12/15/20 1324 12/17/20 1637 12/24/20 1644 12/28/20 1449     Exercise Goal Re-Evaluation   Exercise Goals Review Able to understand and use rate of perceived exertion (RPE) scale;Increase Physical Activity;Knowledge and understanding of Target Heart Rate Range (THRR);Understanding of Exercise Prescription;Increase Strength and Stamina;Able to check pulse independently Increase Physical Activity;Increase Strength and Stamina Increase Physical Activity;Able to understand and use rate of perceived exertion (RPE) scale;Increase Strength and Stamina Increase Physical Activity;Able to understand and use rate of perceived exertion (RPE) scale;Increase Strength and Stamina Increase Physical Activity;Increase Strength and Stamina;Understanding of Exercise Prescription   Comments Reviewed RPE and dyspnea scales, THR and program prescription with pt today.  Pt voiced understanding and was given a copy of goals to take home. Ferlando is progressing well with exercise.  He works in [employer] range and has attended consistently so far.  Staff willmonitor progress. Reviewed home exercise with pt today.  Pt plans to walk and consider joining a gym for exercise.  Reviewed THR, pulse, RPE, sign and symptoms, pulse oximetery and when to call 911 or MD.  Also discussed weather considerations and indoor options.  Pt voiced understanding. Re-reviewed home exercise precautions and protocols with  patient. He has not done any exercise at home yet and was encouraged to do so. Talked about gradual progression and monitor HR and symptoms. Linkon is doing well in rehab. He has switched class to come in ealier to help with his work schedule.  He is up to 4 mph at 7.5% grade and doing well.  We will continue to montior his progress and talk about intervals   Expected Outcomes Short: Use RPE daily to regulate intensity. Long: Follow program prescription in THR. Short:  continue to attend consistently Long:  improve overall MET level Short: start to monitor HR when exercising at home Long:  maintain exercise independently Short: start to monitor HR when exercising at home Long:  maintain exercise independently Short: Talk about interval training Long: Continue to improve stamina   Row Name 01/12/21 1303 01/21/21  1610 01/26/21 1445         Exercise Goal Re-Evaluation   Exercise Goals Review Increase Physical Activity;Increase Strength and Stamina Increase Physical Activity;Increase Strength and Stamina Increase Physical Activity;Increase Strength and Stamina;Understanding of Exercise Prescription     Comments Uzziel attends consistently and works in [employer] range.  The elliptical remains challengiing for him. Larene Beach plans to join [employer] when he completes HT.  He plans to try Yoga to help with stress management Kylil is nearing graduation.  He should improve on his post 6MWT this week.  He is up to 12% grade on his treadmill.  We will continue to monitor his progress.     Expected Outcomes Short: add intervals Long: continue to build stamina Short: continue consistent exercise Long:  build overall stamina Short: Improve post 6MWT long: Get involved into gym on his own            Discharge Exercise Prescription (Final Exercise Prescription Changes):  Exercise Prescription Changes - 01/26/21 1400      Response to Exercise   Blood Pressure (Admit) 124/62    Blood Pressure (Exercise) 162/82    Blood  Pressure (Exit) 124/72    Heart Rate (Admit) 95 bpm    Heart Rate (Exercise) 114 bpm    Heart Rate (Exit) 107 bpm    Rating of Perceived Exertion (Exercise) 15    Symptoms none    Duration Continue with 30 min of aerobic exercise without signs/symptoms of physical distress.    Intensity THRR unchanged      Progression   Progression Continue to progress workloads to maintain intensity without signs/symptoms of physical distress.    Average METs 5.95      Resistance Training   Training Prescription Yes    Weight 12 lb    Reps 10-15      Interval Training   Interval Training No      Treadmill   MPH 3.5    Grade 12    Minutes 15    METs 9.5      T5 Nustep   Level 6    Minutes 15    METs 2.4      Home Exercise Plan   Plans to continue exercise at Home (comment)   walking   Frequency Add 2 additional days to program exercise sessions.    Initial Home Exercises Provided 12/17/20           Nutrition:  Target Goals: Understanding of nutrition guidelines, daily intake of sodium <1541m, cholesterol <2092m calories 30% from fat and 7% or less from saturated fats, daily to have 5 or more servings of fruits and vegetables.  Education: All About Nutrition: -Group instruction provided by verbal, written material, interactive activities, discussions, models, and posters to present general guidelines for heart healthy nutrition including fat, fiber, MyPlate, the role of sodium in heart healthy nutrition, utilization of the nutrition label, and utilization of this knowledge for meal planning. Follow up email sent as well. Written material given at graduation. [facility]  Education need identified 12/02/20  Date 01/27/21  Educator [place] Code 1- Verbalizes Understanding      Biometrics:  Pre Biometrics - 12/02/20 0928      Pre Biometrics   Height 6' 5.1" (1.958 m)    Weight 276 lb (125.2 kg)     BMI (Calculated) 32.66    Single Leg Stand 30 seconds  Nutrition Therapy Plan and Nutrition Goals:  Nutrition Therapy & Goals - 01/25/21 1527      Nutrition Therapy   Diet Heart healthy, low Na, Diabetes friendly    Drug/Food Interactions Statins/Certain Fruits    Protein (specify units) 100g    Fiber 30 grams    Whole Grain Foods 3 servings    Saturated Fats 12 max. grams    Fruits and Vegetables 8 servings/day    Sodium 1.5 grams      Personal Nutrition Goals   Nutrition Goal ST: drink water during exercise and 1/2 bottle of water during the day (crystal lite), practice sructuring dinner using MyPlate LT: Structure meals using MyPlate, drink mostly water, go out to eat <3x/week    Comments B: oatmeal with brown sugar L: out to eat for lunch - [place], subway sandwich, buffalo wild wings D: whatever his mom makes: chicken cordon bleu, pita chips and tzatziki sauce. He has diet soda and crystal lite in water. Discussed heart healthy eating and diabetes friendly eating.      Intervention Plan   Intervention Prescribe, educate and counsel regarding individualized specific dietary modifications aiming towards targeted core components such as weight, hypertension, lipid management, diabetes, heart failure and other comorbidities.;Nutrition handout(s) given to patient.    Expected Outcomes Short Term Goal: Understand basic principles of dietary content, such as calories, fat, sodium, cholesterol and nutrients.;Short Term Goal: A plan has been developed with personal nutrition goals set during dietitian appointment.;Long Term Goal: Adherence to prescribed nutrition plan.           Nutrition Assessments:  MEDIFICTS Score Key:  ?70 Need to make dietary changes   40-70 Heart Healthy Diet  ? 40 Therapeutic Level Cholesterol Diet  Flowsheet Row Cardiac Rehab from 01/28/2021 in [facility] Cardiac and Pulmonary Rehab  Picture Your Plate Total Score on Admission 70  Picture Your  Plate Total Score on Discharge 50     Picture Your Plate Scores:  <30 Unhealthy dietary pattern with much room for improvement.  41-50 Dietary pattern unlikely to meet recommendations for good health and room for improvement.  51-60 More healthful dietary pattern, with some room for improvement.   >60 Healthy dietary pattern, although there may be some specific behaviors that could be improved.    Nutrition Goals Re-Evaluation:  Nutrition Goals Re-Evaluation    Row Name 12/24/20 1615             Goals   Comment Talis has not yet met with the RD to establish goals.       Expected Outcome Short: Meet with RD Long: Maintain heart healthy diet              Nutrition Goals Discharge (Final Nutrition Goals Re-Evaluation):  Nutrition Goals Re-Evaluation - 12/24/20 1615      Goals   Comment Darral has not yet met with the RD to establish goals.    Expected Outcome Short: Meet with RD Long: Maintain heart healthy diet           Psychosocial: Target Goals: Acknowledge presence or absence of significant depression and/or stress, maximize coping skills, provide positive support system. Participant is able to verbalize types and ability to use techniques and skills needed for reducing stress and depression.   Education: Stress, Anxiety, and Depression - Group verbal and visual presentation to define topics covered.  Reviews how body is impacted by stress, anxiety, and depression.  Also discusses healthy ways to reduce stress and to treat/manage anxiety  and depression.  Written material given at graduation. Flowsheet Row Cardiac Rehab from 01/27/2021 in [facility] Cardiac and Pulmonary Rehab  Date 12/23/20  Educator AS  Instruction Review Code 1- Verbalizes Understanding      Education: Sleep Hygiene -Provides group verbal and written instruction about how sleep can affect your health.  Define sleep hygiene, discuss sleep cycles and impact of sleep habits. Review good sleep  hygiene tips.    Initial Review & Psychosocial Screening:  Initial Psych Review & Screening - 11/25/20 1041      Initial Review   Current issues with History of Depression      Family Dynamics   Good Support System? Yes      Barriers   Psychosocial barriers to participate in program There are no identifiable barriers or psychosocial needs.;The patient should benefit from training in stress management and relaxation.      Screening Interventions   Interventions Encouraged to exercise;Provide feedback about the scores to participant;To provide support and resources with identified psychosocial needs    Expected Outcomes Short Term goal: Utilizing psychosocial counselor, staff and physician to assist with identification of specific Stressors or current issues interfering with healing process. Setting desired goal for each stressor or current issue identified.;Long Term Goal: Stressors or current issues are controlled or eliminated.;Short Term goal: Identification and review with participant of any Quality of Life or Depression concerns found by scoring the questionnaire.;Long Term goal: The participant improves quality of Life and PHQ9 Scores as seen by post scores and/or verbalization of changes           Quality of Life Scores:   Quality of Life - 02/01/21 1036      Quality of Life Scores   Health/Function Pre 29.2 %    Health/Function Post 26.53 %    Health/Function % Change -9.14 %    Socioeconomic Pre 27 %    Socioeconomic Post 23.64 %    Socioeconomic % Change  -12.44 %    Psych/Spiritual Pre 27.86 %    Psych/Spiritual Post 30 %    Psych/Spiritual % Change 7.68 %    Family Pre 22.8 %    Family Post 21.33 %    Family % Change -6.45 %    GLOBAL Pre 27.91 %    GLOBAL Post 26.05 %    GLOBAL % Change -6.66 %          Scores of 19 and below usually indicate a poorer quality of life in these areas.  A difference of  2-3 points is a clinically meaningful difference.  A  difference of 2-3 points in the total score of the Quality of Life Index has been associated with significant improvement in overall quality of life, self-image, physical symptoms, and general health in studies assessing change in quality of life.  PHQ-9: Recent Review Flowsheet Data    Depression screen [facility] 2/9 02/01/2021 12/02/2020 06/19/2020 04/17/2019 06/15/2018   Decreased Interest 0 0 0 0 0   Down, Depressed, Hopeless 0 0 0 0 0   PHQ - 2 Score 0 0 0 0 0   Altered sleeping 0 0 - 0 -   Tired, decreased energy 0 0 - 0 -   Change in appetite 0 0 - 0 -   Feeling bad or failure about yourself  0 0 - 0 -   Trouble concentrating 0 0 - 0 -   Moving slowly or fidgety/restless 0 0 - 0 -   Suicidal thoughts 0 0 -  0 -   PHQ-9 Score 0 0 - 0 -   Difficult doing work/chores Not difficult at all Not difficult at all - - -     Interpretation of Total Score  Total Score Depression Severity:  1-4 = Minimal depression, 5-9 = Mild depression, 10-14 = Moderate depression, 15-19 = Moderately severe depression, 20-27 = Severe depression   Psychosocial Evaluation and Intervention:  Psychosocial Evaluation - 11/25/20 1048      Psychosocial Evaluation & Interventions   Interventions Encouraged to exercise with the program and follow exercise prescription    Comments Vernor reports doing well after his NSTEMI. His only complaint is his groin pain post cath if he over does it. He did state he had a history of depression during his last job a while ago, but is doing much better mentally and phsyically now. He is already back to work which is going smoothly and is "low stress." He is currently trying to work on finding two alternative medications to replace [place] since they are so expensive. Besides that, he reported no sleep or current stress issues. He is hoping this program will help him stay on track of a heart healthy lifestyle.    Expected Outcomes Short: attend cardiac rehab for education and  exercise. Long: develop and maintain positive self care habits.    Continue Psychosocial Services  Follow up required by staff           Psychosocial Re-Evaluation:  Psychosocial Re-Evaluation    [place] Name 12/24/20 1625 01/21/21 1607           Psychosocial Re-Evaluation   Current issues with Current Sleep Concerns;Current Stress Concerns Current Stress Concerns      Comments Daquane is holding up well mentally. He stated he has family stress as well as financial stress, though declined to go into detail. He is at least enjoying coming to rehab. Stressed importance about home exercise and utilizing exercise to help with stress management. He is on Citalopram and feels like it is working well. Taking prn medication for anxiety which he hasn't needed.  Doesn't sleep the best- amount of hours of sleep vary every night . Doctor wants to do sleep study, but he did not want to proceed any further after talking about it. Talking to doctor later this month about a possible CPAP, though he is not too happy about the idea. Most if the reason is due to insurance reasons. Told him to discuss with his doctor about other alternatives for programs that can help financially. He has an appointment to talk to doctor about it later this month. Barnet is still taking Citalopram. He does feel exercise helps with stress.  He will consider Mind Body exercise for stress as well - plans to join [employer] when he graduates [employer]      Expected Outcomes Short: Talk to doctor about possible CPAP/sleep study Long: Continue to use exercise for stress management and maintain positive attitude Short: continue to exercise to manage stress Long:  use medication and exercise to manage stress      Interventions Encouraged to attend Cardiac Rehabilitation for the exercise --      Continue Psychosocial Services  Follow up required by staff --             Psychosocial Discharge (Final Psychosocial Re-Evaluation):  Psychosocial  Re-Evaluation - 01/21/21 1607      Psychosocial Re-Evaluation   Current issues with Current Stress Concerns    Comments Symir is  still taking Citalopram. He does feel exercise helps with stress.  He will consider Mind Body exercise for stress as well - plans to join [facility] when he graduates [employer]    Expected Outcomes Short: continue to exercise to manage stress Long:  use medication and exercise to manage stress           Vocational Rehabilitation: Provide vocational rehab assistance to qualifying candidates.   Vocational Rehab Evaluation & Intervention:  Vocational Rehab - 02/01/21 1038      Initial Vocational Rehab Evaluation & Intervention   Assessment shows need for Vocational Rehabilitation No           Education: Education Goals: Education classes will be provided on a variety of topics geared toward better understanding of heart health and risk factor modification. Participant will state understanding/return demonstration of topics presented as noted by education test scores.  Learning Barriers/Preferences:  Learning Barriers/Preferences - 11/25/20 1046      Learning Barriers/Preferences   Learning Barriers None    Learning Preferences None           General Cardiac Education Topics:  AED/CPR: - Group verbal and written instruction with the use of models to demonstrate the basic use of the AED with the basic ABC's of resuscitation.   Anatomy and Cardiac Procedures: - Group verbal and visual presentation and models provide information about basic cardiac anatomy and function. Reviews the testing methods done to diagnose heart disease and the outcomes of the test results. Describes the treatment choices: Medical Management, Angioplasty, or Coronary Bypass Surgery for treating various heart conditions including Myocardial Infarction, Angina, Valve Disease, and Cardiac Arrhythmias.  Written material given at graduation. Flowsheet Row Cardiac Rehab from 01/27/2021 in  [facility] Cardiac and Pulmonary Rehab  Education need identified 12/02/20  Date 01/13/21  Educator [facility]  Instruction Review Code 1- Verbalizes Understanding      Medication Safety: - Group verbal and visual instruction to review commonly prescribed medications for heart and lung disease. Reviews the medication, class of the drug, and side effects. Includes the steps to properly store meds and maintain the prescription regimen.  Written material given at graduation.   Intimacy: - Group verbal instruction through game format to discuss how heart and lung disease can affect sexual intimacy. Written material given at graduation.. Flowsheet Row Cardiac Rehab from 01/27/2021 in [facility] Cardiac and Pulmonary Rehab  Date 01/06/21  Educator [facility]  Instruction Review Code 1- Verbalizes Understanding      Know Your Numbers and Heart Failure: - Group verbal and visual instruction to discuss disease risk factors for cardiac and pulmonary disease and treatment options.  Reviews associated critical values for Overweight/Obesity, Hypertension, Cholesterol, and Diabetes.  Discusses basics of heart failure: signs/symptoms and treatments.  Introduces Heart Failure Zone chart for action plan for heart failure.  Written material given at graduation. Flowsheet Row Cardiac Rehab from 01/27/2021 in [facility] Cardiac and Pulmonary Rehab  Education need identified 12/02/20  Date 12/09/20  Educator [facility]  Instruction Review Code 1- Verbalizes Understanding      Infection Prevention: - Provides verbal and written material to individual with discussion of infection control including proper hand washing and proper equipment cleaning during exercise session. Flowsheet Row Cardiac Rehab from 01/27/2021 in [facility] Cardiac and Pulmonary Rehab  Date 12/02/20  Educator [facility]  Instruction Review Code 1- Verbalizes Understanding      Falls Prevention: - Provides verbal and written material to individual with discussion of falls prevention and  safety. Flowsheet Row  Cardiac Rehab from 01/27/2021 in [facility] Cardiac and Pulmonary Rehab  Date 12/02/20  Educator [facility]  Instruction Review Code 1- Verbalizes Understanding      Other: -Provides group and verbal instruction on various topics (see comments)   Knowledge Questionnaire Score:  Knowledge Questionnaire Score - 02/01/21 1037      Knowledge Questionnaire Score   Pre Score 23/26 Education Focus: nutrition, heart failure, angina    Post Score 23/26           Core Components/Risk Factors/Patient Goals at Admission:  Personal Goals and Risk Factors at Admission - 12/02/20 0930      Core Components/Risk Factors/Patient Goals on Admission    Weight Management Yes;Obesity;Weight Loss    Intervention Weight Management: Develop a combined nutrition and exercise program designed to reach desired caloric intake, while maintaining appropriate intake of nutrient and fiber, sodium and fats, and appropriate energy expenditure required for the weight goal.;Weight Management: Provide education and appropriate resources to help participant work on and attain dietary goals.;Weight Management/Obesity: Establish reasonable short term and long term weight goals.;Obesity: Provide education and appropriate resources to help participant work on and attain dietary goals.    Admit Weight 276 lb (125.2 kg)    Goal Weight: Short Term 270 lb (122.5 kg)    Goal Weight: Long Term 265 lb (120.2 kg)    Expected Outcomes Short Term: Continue to assess and modify interventions until short term weight is achieved;Long Term: Adherence to nutrition and physical activity/exercise program aimed toward attainment of established weight goal;Weight Loss: Understanding of general recommendations for a balanced deficit meal plan, which promotes 1-2 lb weight loss per week and includes a negative energy balance of [phone] kcal/d;Understanding of distribution of calorie intake throughout the day with the consumption of 4-5  meals/snacks;Understanding recommendations for meals to include 15-35% energy as protein, 25-35% energy from fat, 35-60% energy from carbohydrates, less than 263m of dietary cholesterol, 20-35 gm of total fiber daily    Diabetes Yes    Intervention Provide education about signs/symptoms and action to take for hypo/hyperglycemia.;Provide education about proper nutrition, including hydration, and aerobic/resistive exercise prescription along with prescribed medications to achieve blood glucose in normal ranges: Fasting glucose 65-99 mg/dL    Expected Outcomes Short Term: Participant verbalizes understanding of the signs/symptoms and immediate care of hyper/hypoglycemia, proper foot care and importance of medication, aerobic/resistive exercise and nutrition plan for blood glucose control.;Long Term: Attainment of HbA1C < 7%.    Hypertension Yes    Intervention Provide education on lifestyle modifcations including regular physical activity/exercise, weight management, moderate sodium restriction and increased consumption of fresh fruit, vegetables, and low fat dairy, alcohol moderation, and smoking cessation.;Monitor prescription use compliance.    Expected Outcomes Short Term: Continued assessment and intervention until BP is < 140/978mHG in hypertensive participants. < 130/8098mG in hypertensive participants with diabetes, heart failure or chronic kidney disease.;Long Term: Maintenance of blood pressure at goal levels.    Lipids Yes    Intervention Provide education and support for participant on nutrition & aerobic/resistive exercise along with prescribed medications to achieve LDL <45m36mDL >40mg73m Expected Outcomes Short Term: Participant states understanding of desired cholesterol values and is compliant with medications prescribed. Participant is following exercise prescription and nutrition guidelines.;Long Term: Cholesterol controlled with medications as prescribed, with individualized exercise  RX and with personalized nutrition plan. Value goals: LDL < 45mg,49m > 40 mg.           Education:Diabetes -  Individual verbal and written instruction to review signs/symptoms of diabetes, desired ranges of glucose level fasting, after meals and with exercise. Acknowledge that pre and post exercise glucose checks will be done for 3 sessions at entry of program. [place] from 01/27/2021 in [facility] Cardiac and Pulmonary Rehab  Date 11/25/20  Educator [facility]  Instruction Review Code 1- Verbalizes Understanding      Core Components/Risk Factors/Patient Goals Review:   Goals and Risk Factor Review    Row Name 12/24/20 1616 01/21/21 1602           Core Components/Risk Factors/Patient Goals Review   Personal Goals Review Weight Management/Obesity;Hypertension;Lipids;Diabetes Weight Management/Obesity;Hypertension;Lipids;Diabetes      Review Aaryan hasn't been monitoring too much at home. He does weigh himself but admits not as often as he should, he does have a scale at home. Stressed importance on sudden weight gain. Patient reports "he has no goals for his weight" at this time. He doesn't take BP at home- he reports it fluctuates all over, though it has been pretty stable here at rehab. Encouraged him to take his BP more often and starting logging it at home, he does have a cuff to use. He is meeting with RD to establish nutrition goals. Sugars are high, around 160- 200 in the morning- staying compliant with all medications. Doctor hasn't gave him a goal for his sugars per patient.  He is trying to cut out sweets. Also talked about the importance of exercise with helping to regulate blood sugars. He used to eat out a lot and is also trying to cut that down as much as he can. Encouraged to start logging BP, weight, and BS. Sorren is stil not consistently monitoring BP at home.  He will try to check at least once per week. Resting BP 104/60 today in class.  He doesnt consistently check  BG.  He will also try to check this once a week.  He still eats out a lot.  We reviewed having vegetables or a salad as a side instead of fries as a healthier option eating out.      Expected Outcomes Short: Start log to monitor at home (HR, BP, sugar, weight, etc.). Long: Continue to manage lifestyle risk factors Short: check BP and BG once per week at home Long: manage risk factors long term             Core Components/Risk Factors/Patient Goals at Discharge (Final Review):   Goals and Risk Factor Review - 01/21/21 1602      Core Components/Risk Factors/Patient Goals Review   Personal Goals Review Weight Management/Obesity;Hypertension;Lipids;Diabetes    Review Azarion is stil not consistently monitoring BP at home.  He will try to check at least once per week. Resting BP 104/60 today in class.  He doesnt consistently check BG.  He will also try to check this once a week.  He still eats out a lot.  We reviewed having vegetables or a salad as a side instead of fries as a healthier option eating out.    Expected Outcomes Short: check BP and BG once per week at home Long: manage risk factors long term           ITP Comments:  ITP Comments    Row Name 11/25/20 1055 12/02/20 0924 12/07/20 1718 12/09/20 1130 01/06/21 0642   ITP Comments Initial telephone orientation completed. Diagnosis can be found in [facility] 3/2. EP orientation scheduled for Wednesday 3/16 at 8am. Completed 6MWT  and gym orientation. Initial ITP created and sent for review to Dr. Emily Filbert, Medical Director. First full day of exercise!  Patient was oriented to gym and equipment including functions, settings, policies, and procedures.  Patient's individual exercise prescription and treatment plan were reviewed.  All starting workloads were established based on the results of the 6 minute walk test done at initial orientation visit.  The plan for exercise progression was also introduced and progression will be customized based on  patient's performance and goals. 30 Day review completed. Medical Director ITP review done, changes made as directed, and signed approval by Medical Director.  New to program 30 Day review completed. Medical Director ITP review done, changes made as directed, and signed approval by Medical Director.   [place] Name 02/03/21 1000           ITP Comments 30 Day review completed. Medical Director ITP review done, changes made as directed, and signed approval by Medical Director.              Comments:

## 2021-02-03 NOTE — Patient Instructions (Addendum)
Discharge Patient Instructions  Patient Details  Name: James [name] MRN: [id number] Date of Birth: 09-21-1980 Referring Provider:  [name], MD   Number of Visits: 66  Reason for Discharge:  Patient reached a stable level of exercise. Patient independent in their exercise. Patient has met program and personal goals.  Smoking History:  Social History   Tobacco Use  Smoking Status Never Smoker  Smokeless Tobacco Never Used    Diagnosis:  Status post coronary artery stent placement  NSTEMI (non-ST elevated myocardial infarction) [facility])  Initial Exercise Prescription:  Initial Exercise Prescription - 12/02/20 0900      Date of Initial Exercise RX and Referring Provider   Date 12/02/20    Referring Provider [name] MD   Dr. Serafina Royals     Treadmill   MPH 2.5    Grade 2    Minutes 15    METs 3.6      Recumbant Elliptical   Level 3    RPM 50    Minutes 15    METs 3.5      Elliptical   Level 1    Speed 2.5    Minutes 15    METs 3.5      Prescription Details   Frequency (times per week) 3    Duration Progress to 30 minutes of continuous aerobic without signs/symptoms of physical distress      Intensity   THRR 40-80% of Max Heartrate 131-164    Ratings of Perceived Exertion 11-13    Perceived Dyspnea 0-4      Progression   Progression Continue to progress workloads to maintain intensity without signs/symptoms of physical distress.      Resistance Training   Training Prescription Yes    Weight 3 lb    Reps 10-15           Discharge Exercise Prescription (Final Exercise Prescription Changes):  Exercise Prescription Changes - 01/26/21 1400      Response to Exercise   Blood Pressure (Admit) 124/62    Blood Pressure (Exercise) 162/82    Blood Pressure (Exit) 124/72    Heart Rate (Admit) 95 bpm    Heart Rate (Exercise) 114 bpm    Heart Rate (Exit) 107 bpm    Rating of Perceived Exertion (Exercise) 15    Symptoms  none    Duration Continue with 30 min of aerobic exercise without signs/symptoms of physical distress.    Intensity THRR unchanged      Progression   Progression Continue to progress workloads to maintain intensity without signs/symptoms of physical distress.    Average METs 5.95      Resistance Training   Training Prescription Yes    Weight 12 lb    Reps 10-15      Interval Training   Interval Training No      Treadmill   MPH 3.5    Grade 12    Minutes 15    METs 9.5      T5 Nustep   Level 6    Minutes 15    METs 2.4      Home Exercise Plan   Plans to continue exercise at Home (comment)   walking   Frequency Add 2 additional days to program exercise sessions.    Initial Home Exercises Provided 12/17/20           Functional Capacity:  6 Minute Walk    Row Name 12/02/20 0925         6  Minute Walk   Phase Initial     Distance 1350 feet     Walk Time 6 minutes     # of Rest Breaks 0     MPH 2.56     METS 4.66     RPE 7     VO2 Peak 16.31     Symptoms No     Resting HR 99 bpm     Resting BP 120/64     Resting Oxygen Saturation  97 %     Exercise Oxygen Saturation  during 6 min walk 97 %     Max Ex. HR 120 bpm     Max Ex. BP 142/80     2 Minute Post BP 122/60            Nutrition:  Nutrition Therapy & Goals - 01/25/21 1527      Nutrition Therapy   Diet Heart healthy, low Na, Diabetes friendly    Drug/Food Interactions Statins/Certain Fruits    Protein (specify units) 100g    Fiber 30 grams    Whole Grain Foods 3 servings    Saturated Fats 12 max. grams    Fruits and Vegetables 8 servings/day    Sodium 1.5 grams      Personal Nutrition Goals   Nutrition Goal ST: drink water during exercise and 1/2 bottle of water during the day (crystal lite), practice sructuring dinner using MyPlate LT: Structure meals using MyPlate, drink mostly water, go out to eat <3x/week    Comments B: oatmeal with brown sugar L: out to eat for lunch - [place], [employer]  sandwich, buffalo wild wings D: whatever his mom makes: chicken cordon bleu, pita chips and tzatziki sauce. He has diet soda and crystal lite in water. Discussed heart healthy eating and diabetes friendly eating.      Intervention Plan   Intervention Prescribe, educate and counsel regarding individualized specific dietary modifications aiming towards targeted core components such as weight, hypertension, lipid management, diabetes, heart failure and other comorbidities.;Nutrition handout(s) given to patient.    Expected Outcomes Short Term Goal: Understand basic principles of dietary content, such as calories, fat, sodium, cholesterol and nutrients.;Short Term Goal: A plan has been developed with personal nutrition goals set during dietitian appointment.;Long Term Goal: Adherence to prescribed nutrition plan.           Goals reviewed with patient; copy given to patient.

## 2021-02-18 ENCOUNTER — Encounter: Payer: Self-pay | Admitting: Family Medicine

## 2021-04-14 ENCOUNTER — Other Ambulatory Visit: Payer: Self-pay

## 2021-04-14 ENCOUNTER — Encounter: Payer: Self-pay | Admitting: Family Medicine

## 2021-04-14 ENCOUNTER — Ambulatory Visit (INDEPENDENT_AMBULATORY_CARE_PROVIDER_SITE_OTHER): Payer: PRIVATE HEALTH INSURANCE | Admitting: Family Medicine

## 2021-04-14 VITALS — BP 126/70 | HR 87 | Temp 97.5°F | Ht 77.1 in | Wt 269.2 lb

## 2021-04-14 DIAGNOSIS — E1140 Type 2 diabetes mellitus with diabetic neuropathy, unspecified: Secondary | ICD-10-CM

## 2021-04-14 DIAGNOSIS — E669 Obesity, unspecified: Secondary | ICD-10-CM | POA: Diagnosis not present

## 2021-04-14 DIAGNOSIS — E1169 Type 2 diabetes mellitus with other specified complication: Secondary | ICD-10-CM | POA: Diagnosis not present

## 2021-04-14 DIAGNOSIS — E1165 Type 2 diabetes mellitus with hyperglycemia: Secondary | ICD-10-CM | POA: Diagnosis not present

## 2021-04-14 DIAGNOSIS — IMO0002 Reserved for concepts with insufficient information to code with codable children: Secondary | ICD-10-CM

## 2021-04-14 DIAGNOSIS — I2510 Atherosclerotic heart disease of native coronary artery without angina pectoris: Secondary | ICD-10-CM

## 2021-04-14 DIAGNOSIS — E785 Hyperlipidemia, unspecified: Secondary | ICD-10-CM

## 2021-04-14 LAB — POCT GLYCOSYLATED HEMOGLOBIN (HGB A1C): Hemoglobin A1C: 8.2 % — AB (ref 4.0–5.6)

## 2021-04-14 MED ORDER — ATORVASTATIN CALCIUM 80 MG PO TABS: 80.0000 mg | ORAL_TABLET | Freq: Every day | ORAL | 3 refills | Status: DC

## 2021-04-14 MED ORDER — GLIMEPIRIDE 4 MG PO TABS: 4.0000 mg | ORAL_TABLET | Freq: Every day | ORAL | 3 refills | Status: DC

## 2021-04-14 NOTE — Progress Notes (Signed)
Patient ID: James [name], male    DOB: 03-15-1981, 40 y.o.   MRN: [id number]  This visit was conducted in person.  BP 126/70   Pulse 87   Temp (!) 97.5 F (36.4 C) (Temporal)   Ht 6' 5.1" (1.958 m)   Wt 269 lb 3 oz (122.1 kg)   SpO2 98%   BMI 31.84 kg/m    CC: DM f/u visit  Subjective:   HPI: James [name] is a 40 y.o. male presenting on 04/14/2021 for Diabetes (Here for 3 mo f/u.)   Saw cardiology Gwen Pounds) 01/2021 - plavix was stopped.   DM - does regularly check sugars a few times a week fasting 130-180, postprandial last night 125. Compliant with antihyperglycemic regimen which includes: amaryl 2mg  daily, metformin 1000mg  bid. Stopped lantus 10u due to cost. Denies low sugars or hypoglycemic symptoms. Denies paresthesias. Last diabetic eye exam 05/2020. Glucometer brand: bayer contour next. Last foot exam: 12/2020. DSME: 2019. States he's not eligible for patient assistance programs.  Lab Results  Component Value Date   HGBA1C 8.2 (A) 04/14/2021   Diabetic Foot Exam - Simple   No data filed    Lab Results  Component Value Date   MICROALBUR 1.4 04/17/2019     Weight los of 5 lbs - increasing water, working on 04/16/2021 choices, has started regular aerobic exercise at [employer].      Relevant past medical, surgical, family and social history reviewed and updated as indicated. Interim medical history since our last visit reviewed. Allergies and medications reviewed and updated. Outpatient Medications Prior to Visit  Medication Sig Dispense Refill   aspirin EC 81 MG EC tablet Take 1 tablet (81 mg total) by mouth daily. Swallow whole. 30 tablet 11   citalopram (CELEXA) 20 MG tablet TAKE ONE TABLET BY MOUTH DAILY 90 tablet 1   lisinopril (ZESTRIL) 5 MG tablet Take 1 tablet (5 mg total) by mouth daily. 90 tablet 3   metFORMIN (GLUCOPHAGE) 1000 MG tablet TAKE 1 TABLET BY MOUTH TWICE DAILY WITH MEALS (Patient taking differently: Take 1,000 mg by mouth 2  (two) times daily with a meal.) 180 tablet 3   metoprolol succinate (TOPROL XL) 25 MG 24 hr tablet Take 1.5 tablets (37.5 mg total) by mouth daily. 45 tablet 6   Multiple Vitamin (MULTIVITAMIN ADULT) TABS Take 1 tablet by mouth daily. Nature's nutrition Blood sugar support - with cinnamon, chromium, and ALA     Omega-3 Fatty Acids (FISH OIL) 1200 MG CAPS Take 1 capsule by mouth in the morning and at bedtime.     vitamin B-12 (V-R VITAMIN B-12) 500 MCG tablet Take 1 tablet (500 mcg total) by mouth daily.     atorvastatin (LIPITOR) 80 MG tablet Take 1 tablet (80 mg total) by mouth at bedtime. 30 tablet 1   glimepiride (AMARYL) 2 MG tablet Take 1 tablet (2 mg total) by mouth daily with breakfast. Take 1 tablet by mouth once daily with breakfast 30 tablet 1   clopidogrel (PLAVIX) 75 MG tablet Take 1 tablet (75 mg total) by mouth daily. 90 tablet 1   insulin glargine (LANTUS SOLOSTAR) 100 UNIT/ML Solostar Pen Inject 10 Units into the skin daily. (Patient not taking: Reported on 04/14/2021) 15 mL 1   No facility-administered medications prior to visit.     Per HPI unless specifically indicated in ROS section below Review of Systems  Objective:  BP 126/70   Pulse 87   Temp (!) 97.5 F (36.4  C) (Temporal)   Ht 6' 5.1" (1.958 m)   Wt 269 lb 3 oz (122.1 kg)   SpO2 98%   BMI 31.84 kg/m   Wt Readings from Last 3 Encounters:  04/14/21 269 lb 3 oz (122.1 kg)  01/13/21 274 lb (124.3 kg)  12/02/20 276 lb (125.2 kg)      Physical Exam Vitals and nursing note reviewed.  Constitutional:      Appearance: Normal appearance. He is not ill-appearing.  Cardiovascular:     Rate and Rhythm: Normal rate and regular rhythm.     Pulses: Normal pulses.     Heart sounds: Normal heart sounds. No murmur heard. Pulmonary:     Effort: Pulmonary effort is normal. No respiratory distress.     Breath sounds: Normal breath sounds. No wheezing, rhonchi or rales.  Musculoskeletal:     Right lower leg: No edema.      Left lower leg: No edema.  Skin:    General: Skin is warm and dry.  Neurological:     Mental Status: He is alert.  Psychiatric:        Mood and Affect: Mood normal.        Behavior: Behavior normal.      Results for orders placed or performed in visit on 04/14/21  POCT glycosylated hemoglobin (Hb A1C)  Result Value Ref Range   Hemoglobin A1C 8.2 (A) 4.0 - 5.6 %   HbA1c POC (<> result, manual entry)     HbA1c, POC (prediabetic range)     HbA1c, POC (controlled diabetic range)      Assessment & Plan:  This visit occurred during the SARS-CoV-2 public health emergency.  Safety protocols were in place, including screening questions prior to the visit, additional usage of staff PPE, and extensive cleaning of exam room while observing appropriate contact time as indicated for disinfecting solutions.   Problem List Items Addressed This Visit     Dyslipidemia associated with type 2 diabetes mellitus (HCC)    Continues high intensity statin.        Relevant Medications   glimepiride (AMARYL) 4 MG tablet   atorvastatin (LIPITOR) 80 MG tablet   Type 2 diabetes, uncontrolled, with neuropathy (HCC) - Primary    Chronic, stable but uncontrolled. Stopped basal insulin due to cost. Previously unable to afford SGLT2 or GLP1RA. Needs assistance to be able to prescribe these necessary medications especially now with cardiac history. I will ask our pharmacist to touch base with patient on any available assistance. In interim, will increase amaryl to 4mg  daily with breakfast.        Relevant Medications   glimepiride (AMARYL) 4 MG tablet   atorvastatin (LIPITOR) 80 MG tablet   Other Relevant Orders   POCT glycosylated hemoglobin (Hb A1C) (Completed)   AMB Referral to [facility]   Obesity, Class I, BMI 30.0-34.9 (see actual BMI)    Congratulated on weight loss noted.        CAD (coronary artery disease)    NSTEMI 11/2020 s/p DES to RCA underwent cardiac rehab.  Saw  cardiology 01/2021, plavix stopped. Continues aspirin 81mg  daily along with high dose atorvastatin.        Relevant Medications   atorvastatin (LIPITOR) 80 MG tablet     Meds ordered this encounter  Medications   glimepiride (AMARYL) 4 MG tablet    Sig: Take 1 tablet (4 mg total) by mouth daily with breakfast.    Dispense:  90 tablet  Refill:  3   atorvastatin (LIPITOR) 80 MG tablet    Sig: Take 1 tablet (80 mg total) by mouth at bedtime.    Dispense:  90 tablet    Refill:  3    Orders Placed This Encounter  Procedures   AMB Referral to [facility]    Referral Priority:   Routine    Referral Type:   Consultation    Referral Reason:   Care Coordination    Number of Visits Requested:   1   POCT glycosylated hemoglobin (Hb A1C)     Patient Instructions  Sugars remain elevated - increase amaryl to 4mg  daily.  We will have our pharmacist touch base about medication assistance program availability Return in 3 months for physical.   Follow up plan: Return in about 3 months (around 07/15/2021) for annual exam, prior fasting for blood work.  07/17/2021, MD

## 2021-04-14 NOTE — Patient Instructions (Addendum)
Sugars remain elevated - increase amaryl to 4mg  daily.  We will have our pharmacist touch base about medication assistance program availability Return in 3 months for physical.

## 2021-04-14 NOTE — Assessment & Plan Note (Signed)
Congratulated on weight loss noted.  

## 2021-04-14 NOTE — Assessment & Plan Note (Signed)
Continues high intensity statin.

## 2021-04-14 NOTE — Assessment & Plan Note (Signed)
NSTEMI 11/2020 s/p DES to RCA underwent cardiac rehab.  Saw cardiology 01/2021, plavix stopped. Continues aspirin 81mg  daily along with high dose atorvastatin.

## 2021-04-14 NOTE — Assessment & Plan Note (Signed)
Chronic, stable but uncontrolled. Stopped basal insulin due to cost. Previously unable to afford SGLT2 or GLP1RA. Needs assistance to be able to prescribe these necessary medications especially now with cardiac history. I will ask our pharmacist to touch base with patient on any available assistance. In interim, will increase amaryl to 4mg  daily with breakfast.

## 2021-04-21 ENCOUNTER — Telehealth: Payer: Self-pay | Admitting: *Deleted

## 2021-04-21 NOTE — Chronic Care Management (AMB) (Signed)
  Chronic Care Management   Note  04/21/2021 Name: Rhydian Baldi MRN: [id number] DOB: 1981/06/07  Dhruvan Gullion is a 40 y.o. year old male who is a primary care patient of Ria Bush, MD. I reached out to Pixie Casino by phone today in response to a referral sent by Mr. Larene Beach Hollern's PCP Ria Bush, MD     Mr. Coad was given information about Chronic Care Management services today including:  CCM service includes personalized support from designated clinical staff supervised by his physician, including individualized plan of care and coordination with other care providers 24/7 contact phone numbers for assistance for urgent and routine care needs. Service will only be billed when office clinical staff spend 20 minutes or more in a month to coordinate care. Only one practitioner may furnish and bill the service in a calendar month. The patient may stop CCM services at any time (effective at the end of the month) by phone call to the office staff. The patient will be responsible for cost sharing (co-pay) of up to 20% of the service fee (after annual deductible is met).  Patient agreed to services and verbal consent obtained.   Follow up plan: Telephone appointment with care management team member scheduled for: 04/26/2021  Julian Hy, [place] [facility]  Direct Dial: [phone]

## 2021-04-21 NOTE — Chronic Care Management (AMB) (Signed)
  Care Management   Outreach Note  04/21/2021 Name: James [name] MRN: [id number] DOB: 02-24-1981  Referred by: [name], MD Reason for referral : Care Coordination (Initial outreach to schedule referral with [facility] )   An unsuccessful telephone outreach was attempted today. The patient was referred to the case management team for assistance with care management and care coordination.   Follow Up Plan: A HIPAA compliant phone message was left for the patient providing contact information and requesting a return call.  If patient returns call to provider office, please advise to call Embedded Care Management Care Guide Dejane Scheibe at [phone]  Burman Nieves, CCMA Care Guide, Embedded Care Coordination [facility]  Care Management  Direct Dial: [phone]

## 2021-04-22 ENCOUNTER — Telehealth: Payer: Self-pay

## 2021-04-22 ENCOUNTER — Ambulatory Visit: Payer: PRIVATE HEALTH INSURANCE

## 2021-04-22 DIAGNOSIS — IMO0002 Reserved for concepts with insufficient information to code with codable children: Secondary | ICD-10-CM

## 2021-04-22 DIAGNOSIS — E1140 Type 2 diabetes mellitus with diabetic neuropathy, unspecified: Secondary | ICD-10-CM

## 2021-04-22 NOTE — Patient Instructions (Addendum)
Visit Information:  Thank you for taking the time to speak with me today.   PATIENT GOALS:   Goals Addressed             This Visit's Progress    Monitor and Manage My Blood Sugar-Diabetes Type 2   On track    Timeframe:  Long-Range Goal Priority:  High Start Date:  04/22/2021                           Expected End Date: 07/19/2021                 Follow Up Date 04/26/2020   - Call your medical insurance company to find out which insulins are preferred and your copayment.  Your RN case manager is scheduled to follow up with you on 04/26/2021 to assist you further.  - check blood sugar at prescribed times- enter blood sugar readings and medication or insulin into daily log ( take log with you to your doctor appointments) - check blood sugar if I feel it is too high or too low - eat a diabetic/ low carb diet.  - schedule appointment with eye doctor - check feet daily for cuts, sores or redness - wash and dry feet carefully every day - wear comfortable, cotton socks - wear comfortable, well-fitting shoes     Why is this important?   Checking your blood sugar at home helps to keep it from getting very high or very low.  Writing the results in a diary or log helps the doctor know how to care for you.  Your blood sugar log should have the time, date and the results.  Also, write down the amount of insulin or other medicine that you take.  Other information, like what you ate, exercise done and how you were feeling, will also be helpful.     Notes:         Mr. Pipkins was given information about Care Management services by the embedded care coordination team including:  Care Management services include personalized support from designated clinical staff supervised by his physician, including individualized plan of care and coordination with other care providers 24/7 contact phone numbers for assistance for urgent and routine care needs. The patient may stop CCM services at any time  (effective at the end of the month) by phone call to the office staff.  Patient agreed to services and verbal consent obtained.   Patient verbalizes understanding of instructions provided today and agrees to view in MyChart.   The patient has been provided with contact information for the care management team and has been advised to call with any health related questions or concerns.  The care management team will reach out to the patient again over the next 45 days.   George Ina RN,BSN,CCM RN Case Manager [facility] [employer]  [phone]

## 2021-04-22 NOTE — Telephone Encounter (Signed)
  Care Management   Follow Up Note   04/22/2021 Name: Kevonta Phariss MRN: [id number] DOB: 10-24-1980   Referred by: [name], MD Reason for referral : Care Coordination (Care coordination outreach )   An unsuccessful telephone outreach was attempted today. The patient was referred to the case management team for assistance with care management and care coordination.   Follow Up Plan: Patient has follow up call scheduled for 04/26/2021 with RNCM.   SIGNATURE   George Ina RN,BSN,CCM RN Case Manager [place]  [phone]

## 2021-04-22 NOTE — Chronic Care Management (AMB) (Signed)
Chronic Care Management   CCM RN Visit Note  04/22/2021 Name: James [name] MRN: [id number] DOB: 11-Dec-1980  Subjective: James [name] is a 40 y.o. year old male who is a primary care patient of Ria Bush, MD. The care management team was consulted for assistance with disease management and care coordination needs.    Engaged with patient by telephone for  Care coordination visit  in response to provider referral for case management and/or care coordination services.   Consent to Services:  The patient was given the following information about Chronic Care Management services today, agreed to services, and gave verbal consent: 1. CCM service includes personalized support from designated clinical staff supervised by the primary care provider, including individualized plan of care and coordination with other care providers 2. 24/7 contact phone numbers for assistance for urgent and routine care needs. 3. Service will only be billed when office clinical staff spend 20 minutes or more in a month to coordinate care. 4. Only one practitioner may furnish and bill the service in a calendar month. 5.The patient may stop CCM services at any time (effective at the end of the month) by phone call to the office staff. 6. The patient will be responsible for cost sharing (co-pay) of up to 20% of the service fee (after annual deductible is met). Patient agreed to services and consent obtained.  Patient agreed to services and verbal consent obtained.   Assessment: Review of patient past medical history, allergies, medications, health status, including review of consultants reports, laboratory and other test data, was performed as part of comprehensive evaluation and provision of chronic care management services.   SDOH (Social Determinants of Health) assessments and interventions performed:    CCM Care Plan  No Known Allergies  Outpatient Encounter Medications as of 04/22/2021  Medication Sig    aspirin EC 81 MG EC tablet Take 1 tablet (81 mg total) by mouth daily. Swallow whole.   atorvastatin (LIPITOR) 80 MG tablet Take 1 tablet (80 mg total) by mouth at bedtime.   citalopram (CELEXA) 20 MG tablet TAKE ONE TABLET BY MOUTH DAILY   glimepiride (AMARYL) 4 MG tablet Take 1 tablet (4 mg total) by mouth daily with breakfast.   lisinopril (ZESTRIL) 5 MG tablet Take 1 tablet (5 mg total) by mouth daily.   metFORMIN (GLUCOPHAGE) 1000 MG tablet TAKE 1 TABLET BY MOUTH TWICE DAILY WITH MEALS (Patient taking differently: Take 1,000 mg by mouth 2 (two) times daily with a meal.)   metoprolol succinate (TOPROL XL) 25 MG 24 hr tablet Take 1.5 tablets (37.5 mg total) by mouth daily.   Multiple Vitamin (MULTIVITAMIN ADULT) TABS Take 1 tablet by mouth daily. Nature's nutrition Blood sugar support - with cinnamon, chromium, and ALA   Omega-3 Fatty Acids (FISH OIL) 1200 MG CAPS Take 1 capsule by mouth in the morning and at bedtime.   vitamin B-12 (V-R VITAMIN B-12) 500 MCG tablet Take 1 tablet (500 mcg total) by mouth daily.   No facility-administered encounter medications on file as of 04/22/2021.    Patient Active Problem List   Diagnosis Date Noted   CAD (coronary artery disease) 11/18/2020   Tachycardia 06/19/2020   Left leg pain 02/28/2020   OSA (obstructive sleep apnea) 60/73/7106   Folliculitis 26/94/8546   Grade 3 hypertensive retinopathy, left 03/01/2019   Left shoulder pain 05/29/2017   Vitamin B12 deficiency 03/18/2016   Major depressive disorder, recurrent episode, moderate ([place]) 12/26/2015   Foot pain, bilateral 02/03/2015   Health  maintenance examination 12/09/2014   HTN (hypertension)    Type 2 diabetes, uncontrolled, with neuropathy (HCC)    Obesity, Class I, BMI 30.0-34.9 (see actual BMI)    NAFLD (nonalcoholic fatty liver disease) 04/17/2013   Dyslipidemia associated with type 2 diabetes mellitus ([place]) 11/20/2010    Conditions to be addressed/monitored:DMII  Patient Care  Plan: Diabetes Type 2 (Adult)     Problem Identified: Glycemic Management (Diabetes, Type 2)   Priority: High     Long-Range Goal: Glycemic Management Optimized   Start Date: 04/22/2021  Expected End Date: 07/19/2021  This Visit's Progress: On track  Priority: High  Note:   Objective:  Lab Results  Component Value Date   HGBA1C 8.2 (A) 04/14/2021   Lab Results  Component Value Date   CREATININE 0.92 11/20/2020   CREATININE 0.82 11/19/2020   CREATININE 0.80 11/18/2020  Current Barriers:  Knowledge Deficits related to long term care plan for self management of  Diabetes Difficulty affording diabetes medication ( Lantas):  Telephone call to patient regarding medication concerns. Patient states the Leamon Arnt will cost $86.00 per month with his current insurance. He states he is not able to afford this.   Advised patient to contact his insurance company to see which insulin's are preferred and discussed possibly using manufacturer savings card to assist with cost. Patient states he will contact his insurance company to inquire.   Patient states he is currently taking  Glimepiride and Metformin as prescribed by his doctor.  RNCM is scheduled to follow up with patient on 04/26/2021.  Case Manager Clinical Goal(s):  patient will demonstrate improved adherence to prescribed treatment plan for diabetes self care/management as evidenced by: daily monitoring and recording of CBG  adherence to ADA/ carb modified diet exercise days/week adherence to prescribed medication regimen contacting provider for new or worsened symptoms or questions Interventions:  Collaboration with Ria Bush, MD regarding development and update of comprehensive plan of care as evidenced by provider attestation and co-signature Inter-disciplinary care team collaboration (see longitudinal plan of care) Reviewed medications with patient and discussed importance of medication adherence Discussed plans with patient for ongoing  care management follow up and provided patient with direct contact information for care management team Advised patient to call his insurance company to find out which insulins are preferred and his copayment.   RNCM will follow up with patient on 04/26/2021 to further assist.  Patient Goals: - Call your medical insurance company to find out which insulins are preferred and your copayment.  Your RN case manager is scheduled to follow up with you on 04/26/2021 to assist you further.  - check blood sugar at prescribed times- enter blood sugar readings and medication or insulin into daily log ( take log with you to your doctor appointments) - check blood sugar if I feel it is too high or too low - eat a diabetic/ low carb diet.  - schedule appointment with eye doctor - check feet daily for cuts, sores or redness - wash and dry feet carefully every day - wear comfortable, cotton socks - wear comfortable, well-fitting shoes Follow Up Plan: The patient has been provided with contact information for the care management team and has been advised to call with any health related questions or concerns.  The care management team will reach out to the patient again over the next 45 days.            Plan:The patient has been provided with contact information for the care management  team and has been advised to call with any health related questions or concerns.  and The care management team will reach out to the patient again over the next 45 days. Quinn Plowman RN,BSN,CCM RN Case Manager [place]  [phone]

## 2021-04-22 NOTE — Telephone Encounter (Signed)
This encounter was created in error - please disregard.

## 2021-04-26 ENCOUNTER — Ambulatory Visit: Payer: PRIVATE HEALTH INSURANCE

## 2021-04-26 DIAGNOSIS — I10 Essential (primary) hypertension: Secondary | ICD-10-CM

## 2021-04-26 DIAGNOSIS — E1140 Type 2 diabetes mellitus with diabetic neuropathy, unspecified: Secondary | ICD-10-CM

## 2021-04-26 DIAGNOSIS — IMO0002 Reserved for concepts with insufficient information to code with codable children: Secondary | ICD-10-CM

## 2021-04-26 DIAGNOSIS — I2510 Atherosclerotic heart disease of native coronary artery without angina pectoris: Secondary | ICD-10-CM

## 2021-04-26 NOTE — Patient Instructions (Signed)
Visit Information: Thank you for taking the time to speak with me today.   PATIENT GOALS:   Goals Addressed             This Visit's Progress    Improve My Heart Health-Coronary Artery Disease / High blood pressure   On track    Timeframe:  Long-Range Goal Priority:  High Start Date:  04/26/2021                           Expected End Date: 08/18/2021                       Follow Up Date 06/03/2021    - Take your medications as prescribed - Follow up with your doctor as recommended-  - Monitor your blood pressure at home at least 2-3 times per month and record in log/ diary - Have lab work completed as recommended.  - Follow a low salt/ heart healthy diet ( Review education information provided to you in MyChart)  - continue to exercise at least 5 days per week.    Why is this important?   Lifestyle changes are key to improving the blood flow to your heart. Think about the things you can change and set a goal to live healthy.  Remember, when the blood vessels to your heart start to get clogged you may not have any symptoms.  Over time, they can get worse.  Don't ignore the signs, like chest pain, and get help right away.          Monitor and Manage My Blood Sugar-Diabetes Type 2   On track    Timeframe:  Long-Range Goal Priority:  High Start Date:  04/22/2021                           Expected End Date: 08/18/2021               Follow Up Date 06/03/2021  - Call your medical insurance company to find out which insulins are preferred and your copayment.  Your RN case manager is scheduled to follow up with you on 04/26/2021 to assist you further.  - check blood sugar at prescribed times- enter blood sugar readings and medication or insulin into daily log ( take log with you to your doctor appointments) - check blood sugar if I feel it is too high or too low - eat a diabetic/ low carb diet.  - schedule appointment with eye doctor - check feet daily for cuts, sores or redness - wash  and dry feet carefully every day - wear comfortable, cotton socks - wear comfortable, well-fitting shoes     Why is this important?   Checking your blood sugar at home helps to keep it from getting very high or very low.  Writing the results in a diary or log helps the doctor know how to care for you.  Your blood sugar log should have the time, date and the results.  Also, write down the amount of insulin or other medicine that you take.  Other information, like what you ate, exercise done and how you were feeling, will also be helpful.     Notes:         James [name] was given information about Care Management services by the embedded care coordination team including:  Care Management services include personalized support from designated clinical  staff supervised by his physician, including individualized plan of care and coordination with other care providers 24/7 contact phone numbers for assistance for urgent and routine care needs. The patient may stop CCM services at any time (effective at the end of the month) by phone call to the office staff.  Patient agreed to services and verbal consent obtained.   Patient verbalizes understanding of instructions provided today and agrees to view in MyChart.   The patient has been provided with contact information for the care management team and has been advised to call with any health related questions or concerns.  The care management team will reach out to the patient again over the next 45 days.   George Ina RN,BSN,CCM RN Case Manager [facility] [place]  [phone]

## 2021-04-26 NOTE — Chronic Care Management (AMB) (Deleted)
Chronic Care Management   CCM RN Visit Note  04/26/2021 Name: James [name] MRN: [id number] DOB: 1981-04-02  Subjective: James [name] is a 40 y.o. year old male who is a primary care patient of Ria Bush, MD. The care management team was consulted for assistance with disease management and care coordination needs.    Engaged with patient by telephone for initial visit in response to provider referral for case management and/or care coordination services.   Consent to Services:  The patient was given the following information about Chronic Care Management services today, agreed to services, and gave verbal consent: 1. CCM service includes personalized support from designated clinical staff supervised by the primary care provider, including individualized plan of care and coordination with other care providers 2. 24/7 contact phone numbers for assistance for urgent and routine care needs. 3. Service will only be billed when office clinical staff spend 20 minutes or more in a month to coordinate care. 4. Only one practitioner may furnish and bill the service in a calendar month. 5.The patient may stop CCM services at any time (effective at the end of the month) by phone call to the office staff. 6. The patient will be responsible for cost sharing (co-pay) of up to 20% of the service fee (after annual deductible is met). Patient agreed to services and consent obtained.  Patient agreed to services and verbal consent obtained.   Assessment: Review of patient past medical history, allergies, medications, health status, including review of consultants reports, laboratory and other test data, was performed as part of comprehensive evaluation and provision of chronic care management services.   SDOH (Social Determinants of Health) assessments and interventions performed:  SDOH Interventions    Flowsheet Row Most Recent Value  SDOH Interventions   Food Insecurity Interventions  Intervention Not Indicated  Housing Interventions Intervention Not Indicated  Transportation Interventions Intervention Not Indicated        CCM Care Plan  No Known Allergies  Outpatient Encounter Medications as of 04/26/2021  Medication Sig   aspirin EC 81 MG EC tablet Take 1 tablet (81 mg total) by mouth daily. Swallow whole.   atorvastatin (LIPITOR) 80 MG tablet Take 1 tablet (80 mg total) by mouth at bedtime.   citalopram (CELEXA) 20 MG tablet TAKE ONE TABLET BY MOUTH DAILY   glimepiride (AMARYL) 4 MG tablet Take 1 tablet (4 mg total) by mouth daily with breakfast.   lisinopril (ZESTRIL) 5 MG tablet Take 1 tablet (5 mg total) by mouth daily.   metFORMIN (GLUCOPHAGE) 1000 MG tablet TAKE 1 TABLET BY MOUTH TWICE DAILY WITH MEALS (Patient taking differently: Take 1,000 mg by mouth 2 (two) times daily with a meal.)   metoprolol succinate (TOPROL XL) 25 MG 24 hr tablet Take 1.5 tablets (37.5 mg total) by mouth daily.   Multiple Vitamin (MULTIVITAMIN ADULT) TABS Take 1 tablet by mouth daily. Nature's nutrition Blood sugar support - with cinnamon, chromium, and ALA   Omega-3 Fatty Acids (FISH OIL) 1200 MG CAPS Take 1 capsule by mouth in the morning and at bedtime.   vitamin B-12 (V-R VITAMIN B-12) 500 MCG tablet Take 1 tablet (500 mcg total) by mouth daily.   No facility-administered encounter medications on file as of 04/26/2021.    Patient Active Problem List   Diagnosis Date Noted   CAD (coronary artery disease) 11/18/2020   Tachycardia 06/19/2020   Left leg pain 02/28/2020   OSA (obstructive sleep apnea) 00/93/8182   Folliculitis 99/37/1696   Grade 3  hypertensive retinopathy, left 03/01/2019   Left shoulder pain 05/29/2017   Vitamin B12 deficiency 03/18/2016   Major depressive disorder, recurrent episode, moderate ([place]) 12/26/2015   Foot pain, bilateral 02/03/2015   Health maintenance examination 12/09/2014   HTN (hypertension)    Type 2 diabetes, uncontrolled, with neuropathy  (HCC)    Obesity, Class I, BMI 30.0-34.9 (see actual BMI)    NAFLD (nonalcoholic fatty liver disease) 04/17/2013   Dyslipidemia associated with type 2 diabetes mellitus ([place]) 11/20/2010    Conditions to be addressed/monitored:CAD, HTN, and DMII  Care Plan : Diabetes Type 2 (Adult)  Updates made by Dannielle Karvonen, RN since 04/26/2021 12:00 AM     Problem: Glycemic Management (Diabetes, Type 2)   Priority: High     Long-Range Goal: Glycemic Management Optimized   Start Date: 04/22/2021  Expected End Date: 08/18/2021  This Visit's Progress: On track  Recent Progress: On track  Priority: High  Note:   Objective:  Lab Results  Component Value Date   HGBA1C 8.2 (A) 04/14/2021   Lab Results  Component Value Date   CREATININE 0.92 11/20/2020   CREATININE 0.82 11/19/2020   CREATININE 0.80 11/18/2020  Current Barriers:  Knowledge Deficits related to long term care plan for self management of  Diabetes Difficulty affording diabetes medication ( Lantas):  Patient states he contacted his insurance company and his only coverage for prescriptions is a medication card that provides discounts. No other medication insurance coverage.  Patient states he also has a Personal assistant savings card that he obtained through [employer] that helps with discounts.  Patient reports recent fasting blood sugar values,  125, 150.  Denies any low blood sugars <70.   Reports highest blood sugar was 190.  Case Manager Clinical Goal(s):  patient will demonstrate improved adherence to prescribed treatment plan for diabetes self care/management as evidenced by: daily monitoring and recording of CBG  adherence to ADA/ carb modified diet exercise days/week adherence to prescribed medication regimen contacting provider for new or worsened symptoms or questions Interventions:  Collaboration with Ria Bush, MD regarding development and update of comprehensive plan of care as evidenced by provider attestation and  co-signature Inter-disciplinary care team collaboration (see longitudinal plan of care) Reviewed medications with patient and discussed importance of medication adherence Discussed plans with patient for ongoing care management follow up and provided patient with direct contact information for care management team RNCM will follow up on any other options for patient regarding medication assistance/ coverage.  Patient Goals: - check blood sugar at prescribed times- enter blood sugar readings and medication or insulin into daily log ( take log with you to your doctor appointments) - check blood sugar if I feel it is too high or too low - eat a diabetic/ low carb diet.  - schedule appointment with eye doctor - check feet daily for cuts, sores or redness - wash and dry feet carefully every day - wear comfortable, cotton socks - wear comfortable, well-fitting shoes Follow Up Plan: The patient has been provided with contact information for the care management team and has been advised to call with any health related questions or concerns.  The care management team will reach out to the patient again over the next 45 days.         Care Plan : Cardiovascular  Updates made by Dannielle Karvonen, RN since 04/26/2021 12:00 AM     Problem: Coronary artery disease/ HTN   Priority: High     Long-Range  Goal: Disease Progression Prevented or Minimized   Start Date: 04/26/2021  Expected End Date: 08/18/2021  This Visit's Progress: On track  Priority: High  Note:   Current Barriers:  Patient states he is status post stent placement in March 2022.  Last  cardiology visit was 01/25/2021 Knowledge deficits related to long term care plan for self management of Coronary artery disease/  Hypertension Difficulty affording medication ( for diabetes) Nurse Case Manager Clinical Goal(s):  patient will take medications as prescribed and will call provider for medication related questions patient will verbalize  understanding of cardiovascular / Hypertension symptoms and when to call doctor patient will attend scheduled medical appointments patient will demonstrate improved adherence to prescribed treatment plan for coronary artery disease/ Hypertension by:  Continuing to exercise at least 5 days per week. Taking medications as prescribed, adhering to low salt heart healthy diet.  Interventions:  Collaboration with Ria Bush, MD regarding development and update of comprehensive plan of care as evidenced by provider attestation and co-signature Inter-disciplinary care team collaboration (see longitudinal plan of care) Basic overview and discussion of CAD / Hypertension Medications reviewed and discussed  Education information sent to patient on coronary artery disease / Hypertension. Patient advised to review and discuss with RNCM at next outreach Advised patient, providing education and rationale, to monitor blood pressure at least 2-3 times per month and record, calling doctor for findings outside established parameters.  Reviewed scheduled/upcoming provider appointments:  Next follow up appointment with primary care provider is August 02, 2021 Patient Goals:  - Take your medications as prescribed - Follow up with your doctor as recommended-  - Monitor your blood pressure at home at least 2-3 times per month and record in log/ diary - Have lab work completed as recommended.  - Follow a low salt/ heart healthy diet ( Review education information provided to you in MyChart)  - continue to exercise at least 5 days per week.  Follow Up Plan: The patient has been provided with contact information for the care management team and has been advised to call with any health related questions or concerns.  The care management team will reach out to the patient again over the next 45 days.          Plan:The patient has been provided with contact information for the care management team and has been  advised to call with any health related questions or concerns.  and The care management team will reach out to the patient again over the next 45 days. Quinn Plowman RN,BSN,CCM RN Case Manager [place]  [phone]

## 2021-04-26 NOTE — Chronic Care Management (AMB) (Signed)
Care Management    RN Visit Note  04/26/2021 Name: James [name] MRN: [id number] DOB: 07-31-1981  Subjective: James [name] is a 40 y.o. year old male who is a primary care patient of [name], MD. The care management team was consulted for assistance with disease management and care coordination needs.    Engaged with patient by telephone for initial visit in response to provider referral for case management and/or care coordination services.   Consent to Services:   [name] was given information about Care Management services today including:  Care Management services includes personalized support from designated clinical staff supervised by his physician, including individualized plan of care and coordination with other care providers 24/7 contact phone numbers for assistance for urgent and routine care needs. The patient may stop case management services at any time by phone call to the office staff.  Patient agreed to services and consent obtained.   Assessment: Review of patient past medical history, allergies, medications, health status, including review of consultants reports, laboratory and other test data, was performed as part of comprehensive evaluation and provision of chronic care management services.   SDOH (Social Determinants of Health) assessments and interventions performed:  SDOH Interventions    Flowsheet Row Most Recent Value  SDOH Interventions   Food Insecurity Interventions Intervention Not Indicated  Housing Interventions Intervention Not Indicated  Transportation Interventions Intervention Not Indicated        Care Plan  No Known Allergies  Outpatient Encounter Medications as of 04/26/2021  Medication Sig   aspirin EC 81 MG EC tablet Take 1 tablet (81 mg total) by mouth daily. Swallow whole.   atorvastatin (LIPITOR) 80 MG tablet Take 1 tablet (80 mg total) by mouth at bedtime.   citalopram (CELEXA) 20 MG tablet TAKE ONE TABLET  BY MOUTH DAILY   glimepiride (AMARYL) 4 MG tablet Take 1 tablet (4 mg total) by mouth daily with breakfast.   lisinopril (ZESTRIL) 5 MG tablet Take 1 tablet (5 mg total) by mouth daily.   metFORMIN (GLUCOPHAGE) 1000 MG tablet TAKE 1 TABLET BY MOUTH TWICE DAILY WITH MEALS (Patient taking differently: Take 1,000 mg by mouth 2 (two) times daily with a meal.)   metoprolol succinate (TOPROL XL) 25 MG 24 hr tablet Take 1.5 tablets (37.5 mg total) by mouth daily.   Multiple Vitamin (MULTIVITAMIN ADULT) TABS Take 1 tablet by mouth daily. Nature's nutrition Blood sugar support - with cinnamon, chromium, and ALA   Omega-3 Fatty Acids (FISH OIL) 1200 MG CAPS Take 1 capsule by mouth in the morning and at bedtime.   vitamin B-12 (V-R VITAMIN B-12) 500 MCG tablet Take 1 tablet (500 mcg total) by mouth daily.   No facility-administered encounter medications on file as of 04/26/2021.    Patient Active Problem List   Diagnosis Date Noted   CAD (coronary artery disease) 11/18/2020   Tachycardia 06/19/2020   Left leg pain 02/28/2020   OSA (obstructive sleep apnea) 11/26/2019   Folliculitis 11/26/2019   Grade 3 hypertensive retinopathy, left 03/01/2019   Left shoulder pain 05/29/2017   Vitamin B12 deficiency 03/18/2016   Major depressive disorder, recurrent episode, moderate (HCC) 12/26/2015   Foot pain, bilateral 02/03/2015   Health maintenance examination 12/09/2014   HTN (hypertension)    Type 2 diabetes, uncontrolled, with neuropathy (HCC)    Obesity, Class I, BMI 30.0-34.9 (see actual BMI)    NAFLD (nonalcoholic fatty liver disease) 93/81/0175   Dyslipidemia associated with type 2 diabetes mellitus (HCC) 11/20/2010  Conditions to be addressed/monitored: CAD, HTN, and DMII  Care Plan : Diabetes Type 2 (Adult)  Updates made by Otho Ket, RN since 04/26/2021 12:00 AM     Problem: Glycemic Management (Diabetes, Type 2)   Priority: High     Long-Range Goal: Glycemic Management Optimized    Start Date: 04/22/2021  Expected End Date: 08/18/2021  This Visit's Progress: On track  Recent Progress: On track  Priority: High  Note:   Objective:  Lab Results  Component Value Date   HGBA1C 8.2 (A) 04/14/2021   Lab Results  Component Value Date   CREATININE 0.92 11/20/2020   CREATININE 0.82 11/19/2020   CREATININE 0.80 11/18/2020  Current Barriers:  Knowledge Deficits related to long term care plan for self management of  Diabetes Difficulty affording diabetes medication ( Lantas):  Patient states he contacted his insurance company and his only coverage for prescriptions is a medication card that provides discounts. No other medication insurance coverage.  Patient states he also has a Dispensing optician savings card that he obtained through [employer] that helps with discounts.  Patient reports recent fasting blood sugar values,  125, 150.  Denies any low blood sugars <70.   Reports highest blood sugar was 190.  Case Manager Clinical Goal(s):  patient will demonstrate improved adherence to prescribed treatment plan for diabetes self care/management as evidenced by: daily monitoring and recording of CBG  adherence to ADA/ carb modified diet exercise days/week adherence to prescribed medication regimen contacting provider for new or worsened symptoms or questions Interventions:  Collaboration with [name], MD regarding development and update of comprehensive plan of care as evidenced by provider attestation and co-signature Inter-disciplinary care team collaboration (see longitudinal plan of care) Reviewed medications with patient and discussed importance of medication adherence Discussed plans with patient for ongoing care management follow up and provided patient with direct contact information for care management team RNCM will follow up on any other options for patient regarding medication assistance/ coverage.  Patient Goals: - check blood sugar at prescribed times- enter blood  sugar readings and medication or insulin into daily log ( take log with you to your doctor appointments) - check blood sugar if I feel it is too high or too low - eat a diabetic/ low carb diet.  - schedule appointment with eye doctor - check feet daily for cuts, sores or redness - wash and dry feet carefully every day - wear comfortable, cotton socks - wear comfortable, well-fitting shoes Follow Up Plan: The patient has been provided with contact information for the care management team and has been advised to call with any health related questions or concerns.  The care management team will reach out to the patient again over the next 45 days.         Care Plan : Cardiovascular  Updates made by Otho Ket, RN since 04/26/2021 12:00 AM     Problem: Coronary artery disease/ HTN   Priority: High     Long-Range Goal: Disease Progression Prevented or Minimized   Start Date: 04/26/2021  Expected End Date: 08/18/2021  This Visit's Progress: On track  Priority: High  Note:   Current Barriers:  Patient states he is status post stent placement in March 2022.  Last  cardiology visit was 01/25/2021 Knowledge deficits related to long term care plan for self management of Coronary artery disease/  Hypertension Difficulty affording medication ( for diabetes) Nurse Case Manager Clinical Goal(s):  patient will take medications as prescribed  and will call provider for medication related questions patient will verbalize understanding of cardiovascular / Hypertension symptoms and when to call doctor patient will attend scheduled medical appointments patient will demonstrate improved adherence to prescribed treatment plan for coronary artery disease/ Hypertension by:  Continuing to exercise at least 5 days per week. Taking medications as prescribed, adhering to low salt heart healthy diet.  Interventions:  Collaboration with [name], MD regarding development and update of comprehensive  plan of care as evidenced by provider attestation and co-signature Inter-disciplinary care team collaboration (see longitudinal plan of care) Basic overview and discussion of CAD / Hypertension Medications reviewed and discussed  Education information sent to patient on coronary artery disease / Hypertension. Patient advised to review and discuss with RNCM at next outreach Advised patient, providing education and rationale, to monitor blood pressure at least 2-3 times per month and record, calling doctor for findings outside established parameters.  Reviewed scheduled/upcoming provider appointments:  Next follow up appointment with primary care provider is August 02, 2021 Patient Goals:  - Take your medications as prescribed - Follow up with your doctor as recommended-  - Monitor your blood pressure at home at least 2-3 times per month and record in log/ diary - Have lab work completed as recommended.  - Follow a low salt/ heart healthy diet ( Review education information provided to you in MyChart)  - continue to exercise at least 5 days per week.  Follow Up Plan: The patient has been provided with contact information for the care management team and has been advised to call with any health related questions or concerns.  The care management team will reach out to the patient again over the next 45 days.          Plan: The patient has been provided with contact information for the care management team and has been advised to call with any health related questions or concerns.  and The care management team will reach out to the patient again over the next 45 days.  George Ina RN,BSN,CCM RN Case Manager [facility] [place]  [phone]

## 2021-05-05 ENCOUNTER — Ambulatory Visit: Payer: PRIVATE HEALTH INSURANCE | Admitting: Pharmacy Technician

## 2021-05-05 ENCOUNTER — Encounter: Payer: Self-pay | Admitting: Family Medicine

## 2021-05-05 DIAGNOSIS — Z79899 Other long term (current) drug therapy: Secondary | ICD-10-CM

## 2021-05-06 ENCOUNTER — Other Ambulatory Visit: Payer: Self-pay

## 2021-05-06 ENCOUNTER — Encounter: Payer: Self-pay | Admitting: Family Medicine

## 2021-05-06 MED ORDER — METOPROLOL SUCCINATE ER 25 MG PO TB24
25.0000 mg | ORAL_TABLET | Freq: Every day | ORAL | 2 refills | Status: DC
  Filled 2021-05-06: qty 45, 45d supply, fill #0

## 2021-05-06 MED ORDER — ATORVASTATIN CALCIUM 80 MG PO TABS: 80.0000 mg | ORAL_TABLET | Freq: Every day | ORAL | 2 refills | Status: DC

## 2021-05-06 MED ORDER — GLIMEPIRIDE 4 MG PO TABS: 4.0000 mg | ORAL_TABLET | Freq: Every day | ORAL | 2 refills | Status: DC

## 2021-05-06 MED ORDER — METFORMIN HCL 1000 MG PO TABS: 1000.0000 mg | ORAL_TABLET | Freq: Two times a day (BID) | ORAL | 0 refills | Status: DC

## 2021-05-06 MED ORDER — INSULIN GLARGINE SOLOSTAR 100 UNIT/ML ~~LOC~~ SOPN: PEN_INJECTOR | SUBCUTANEOUS | 0 refills | Status: DC

## 2021-05-06 MED ORDER — LISINOPRIL 5 MG PO TABS
5.0000 mg | ORAL_TABLET | Freq: Every day | ORAL | 1 refills | Status: DC
  Filled 2021-05-06: qty 90, 90d supply, fill #0

## 2021-05-07 ENCOUNTER — Other Ambulatory Visit: Payer: Self-pay

## 2021-05-07 NOTE — Progress Notes (Signed)
Patient has prescription drug coverage with [employer] through [employer].  Spoke with Rutherford Nail at [facility] [phone] and verified that patient's co-pays vary depending on medication.  Made patient aware that he does not meet [facility]'s eligibility criteria.  Mailing patient a [employer] PAP application to try to obtain Lantus.  Patient to work with provider to completer PAP application.  Sherilyn Dacosta Care Manager [facility]

## 2021-05-18 ENCOUNTER — Telehealth: Payer: Self-pay

## 2021-05-18 NOTE — Telephone Encounter (Addendum)
Received Lantus shipment (6 boxes) for pt from [employer].     [Placed med in 1st refrigerator on 2nd shelf.]  Left message on vm per dpr notifying pt his medication was delivered and is ready to pick up.

## 2021-05-20 NOTE — Telephone Encounter (Signed)
Pt picked up med today.

## 2021-06-03 ENCOUNTER — Ambulatory Visit: Payer: PRIVATE HEALTH INSURANCE

## 2021-06-03 NOTE — Chronic Care Management (AMB) (Signed)
Chronic Care Management   CCM RN Visit Note  06/03/2021 Name: James [name] MRN: [id number] DOB: 1981-06-01  Subjective: James [name] is a 40 y.o. year old male who is a primary care patient of [name], MD. The care management team was consulted for assistance with disease management and care coordination needs.    Engaged with patient by telephone for follow up visit in response to provider referral for case management and/or care coordination services.   Consent to Services:  The patient was given information about Chronic Care Management services, agreed to services, and gave verbal consent prior to initiation of services.  Please see initial visit note for detailed documentation.   Patient agreed to services and verbal consent obtained.   Assessment: Review of patient past medical history, allergies, medications, health status, including review of consultants reports, laboratory and other test data, was performed as part of comprehensive evaluation and provision of chronic care management services.   SDOH (Social Determinants of Health) assessments and interventions performed:    CCM Care Plan  No Known Allergies  Outpatient Encounter Medications as of 06/03/2021  Medication Sig   aspirin EC 81 MG EC tablet Take 1 tablet (81 mg total) by mouth daily. Swallow whole.   atorvastatin (LIPITOR) 80 MG tablet Take 1 tablet (80 mg total) by mouth at bedtime.   atorvastatin (LIPITOR) 80 MG tablet Take 1 tablet (80 mg total) by mouth once daily at bedtime.   citalopram (CELEXA) 20 MG tablet TAKE ONE TABLET BY MOUTH DAILY   glimepiride (AMARYL) 4 MG tablet Take 1 tablet (4 mg total) by mouth daily with breakfast.   glimepiride (AMARYL) 4 MG tablet Take 1 tablet (4 mg total) by mouth daily with breakfast.   Insulin Glargine Solostar (LANTUS) 100 UNIT/ML Solostar Pen Inject 10 units under the skin once daily.   lisinopril (ZESTRIL) 5 MG tablet Take 1 tablet (5 mg total) by  mouth daily.   lisinopril (ZESTRIL) 5 MG tablet Take 1 tablet (5 mg total) by mouth once daily.   metFORMIN (GLUCOPHAGE) 1000 MG tablet TAKE 1 TABLET BY MOUTH TWICE DAILY WITH MEALS (Patient taking differently: Take 1,000 mg by mouth 2 (two) times daily with a meal.)   metFORMIN (GLUCOPHAGE) 1000 MG tablet Take 1 tablet (1,000 mg total) by mouth 2 (two) times daily with meals.   metoprolol succinate (TOPROL XL) 25 MG 24 hr tablet Take 1.5 tablets (37.5 mg total) by mouth daily.   metoprolol succinate (TOPROL-XL) 25 MG 24 hr tablet Take 1.5 tablets (25 mg total) by mouth once daily.   Multiple Vitamin (MULTIVITAMIN ADULT) TABS Take 1 tablet by mouth daily. Nature's nutrition Blood sugar support - with cinnamon, chromium, and ALA   Omega-3 Fatty Acids (FISH OIL) 1200 MG CAPS Take 1 capsule by mouth in the morning and at bedtime.   vitamin B-12 (V-R VITAMIN B-12) 500 MCG tablet Take 1 tablet (500 mcg total) by mouth daily.   No facility-administered encounter medications on file as of 06/03/2021.    Patient Active Problem List   Diagnosis Date Noted   CAD (coronary artery disease) 11/18/2020   Tachycardia 06/19/2020   Left leg pain 02/28/2020   OSA (obstructive sleep apnea) 11/26/2019   Folliculitis 11/26/2019   Grade 3 hypertensive retinopathy, left 03/01/2019   Left shoulder pain 05/29/2017   Vitamin B12 deficiency 03/18/2016   Major depressive disorder, recurrent episode, moderate (HCC) 12/26/2015   Foot pain, bilateral 02/03/2015   Health maintenance examination 12/09/2014  HTN (hypertension)    Type 2 diabetes, uncontrolled, with neuropathy (HCC)    Obesity, Class I, BMI 30.0-34.9 (see actual BMI)    NAFLD (nonalcoholic fatty liver disease) 70/09/7492   Dyslipidemia associated with type 2 diabetes mellitus (HCC) 11/20/2010    Conditions to be addressed/monitored:CAD, HTN, and DMII  Care Plan : Diabetes Type 2 (Adult)  Updates made by Otho Ket, RN since 06/03/2021 12:00 AM      Problem: Glycemic Management (Diabetes, Type 2)   Priority: Medium     Long-Range Goal: Glycemic Management Optimized   Start Date: 04/22/2021  Expected End Date: 08/18/2021  This Visit's Progress: On track  Recent Progress: On track  Priority: High  Note:   Objective:  Lab Results  Component Value Date   HGBA1C 8.2 (A) 04/14/2021   Lab Results  Component Value Date   CREATININE 0.92 11/20/2020   CREATININE 0.82 11/19/2020   CREATININE 0.80 11/18/2020  Current Barriers:  Knowledge Deficits related to long term care plan for self management of  Diabetes Difficulty affording diabetes medication ( Lantas):  Patient states he was able to receive medication assistance for his Lantas which should extend until August 2023.  He report next follow up visit with primary care provider is in November 2022. Patient states his blood sugars are ranging from 120's to 180.  Denies any blood sugars <70.  Patient reports his blood pressure ranges in the 130/80's.  Denies any additional concerns.  Case Manager Clinical Goal(s):  patient will demonstrate improved adherence to prescribed treatment plan for diabetes self care/management as evidenced by: daily monitoring and recording of CBG  adherence to ADA/ carb modified diet exercise days/week adherence to prescribed medication regimen contacting provider for new or worsened symptoms or questions Interventions:  Collaboration with [name], MD regarding development and update of comprehensive plan of care as evidenced by provider attestation and co-signature Inter-disciplinary care team collaboration (see longitudinal plan of care) Reviewed medications with patient and discussed importance of medication adherence Discussed plans with patient for ongoing care management follow up and provided patient with direct contact information for care management team Patient Goals: - Continue to check blood sugar at prescribed times- enter blood sugar  readings and medication or insulin into daily log ( take log with you to your doctor appointments) - check blood sugar if I feel it is too high or too low - Continue to eat a diabetic/ low carb diet.  - schedule appointment with eye doctor - check feet daily for cuts, sores or redness - wash and dry feet carefully every day, wear comfortable, well fitting shoes and cotton socks - Take your medication as prescribed and refill timely.  - Follow up with your doctor as recommended - Call your doctor for new or ongoing symptoms.  Follow Up Plan: The patient has been provided with contact information for the care management team and has been advised to call with any health related questions or concerns.  The care management team will reach out to the patient again over the next 45 days.         Care Plan : Cardiovascular  Updates made by Otho Ket, RN since 06/03/2021 12:00 AM     Problem: Coronary artery disease/ HTN   Priority: High     Long-Range Goal: Disease Progression Prevented or Minimized   Start Date: 04/26/2021  Expected End Date: 08/18/2021  This Visit's Progress: On track  Recent Progress: On track  Priority: Medium  Note:   Current Barriers:    Patient reports his blood pressure ranges in the 130/80's.  Denies any concerns at this time.  Knowledge deficits related to long term care plan for self management of Coronary artery disease/  Hypertension Difficulty affording medication ( for diabetes) Nurse Case Manager Clinical Goal(s):  patient will take medications as prescribed and will call provider for medication related questions patient will verbalize understanding of cardiovascular / Hypertension symptoms and when to call doctor patient will attend scheduled medical appointments patient will demonstrate improved adherence to prescribed treatment plan for coronary artery disease/ Hypertension by:  Continuing to exercise at least 5 days per week. Taking medications as  prescribed, adhering to low salt heart healthy diet.  Interventions:  Collaboration with [name], MD regarding development and update of comprehensive plan of care as evidenced by provider attestation and co-signature Inter-disciplinary care team collaboration (see longitudinal plan of care) Basic overview and discussion of CAD / Hypertension Medications reviewed and discussed  Education information sent to patient on coronary artery disease / Hypertension. Patient advised to review and discuss with RNCM at next outreach Advised patient, providing education and rationale, to monitor blood pressure at least 2-3 times per month and record, calling doctor for findings outside established parameters.  Reviewed scheduled/upcoming provider appointments:  Next follow up appointment with primary care provider is August 02, 2021 Patient Goals:  - Continue to take your medications as prescribed - Follow up with your doctor as recommended-  - Continue to monitor your blood pressure at home at least 2-3 times per month and record in log/ diary - Have lab work completed as recommended.  - Follow a low salt/ heart healthy diet ( Review education information provided to you in MyChart)  - Continue to exercise at least 5 days per week.  Follow Up Plan: The patient has been provided with contact information for the care management team and has been advised to call with any health related questions or concerns.  The care management team will reach out to the patient again over the next 45 days.          Plan:The patient has been provided with contact information for the care management team and has been advised to call with any health related questions or concerns.  and The care management team will reach out to the patient again over the next 2 months. George Ina RN,BSN,CCM RN Case Manager [facility] [place]  [phone]

## 2021-06-10 ENCOUNTER — Other Ambulatory Visit: Payer: Self-pay | Admitting: Family Medicine

## 2021-08-02 ENCOUNTER — Other Ambulatory Visit: Payer: Self-pay

## 2021-08-02 ENCOUNTER — Ambulatory Visit (INDEPENDENT_AMBULATORY_CARE_PROVIDER_SITE_OTHER): Payer: PRIVATE HEALTH INSURANCE | Admitting: Family Medicine

## 2021-08-02 ENCOUNTER — Encounter: Payer: Self-pay | Admitting: Family Medicine

## 2021-08-02 VITALS — BP 128/80 | HR 100 | Temp 97.9°F | Ht 76.0 in | Wt 279.1 lb

## 2021-08-02 DIAGNOSIS — Z0000 Encounter for general adult medical examination without abnormal findings: Secondary | ICD-10-CM

## 2021-08-02 DIAGNOSIS — K760 Fatty (change of) liver, not elsewhere classified: Secondary | ICD-10-CM

## 2021-08-02 DIAGNOSIS — E1140 Type 2 diabetes mellitus with diabetic neuropathy, unspecified: Secondary | ICD-10-CM | POA: Diagnosis not present

## 2021-08-02 DIAGNOSIS — E538 Deficiency of other specified B group vitamins: Secondary | ICD-10-CM

## 2021-08-02 DIAGNOSIS — I10 Essential (primary) hypertension: Secondary | ICD-10-CM | POA: Diagnosis not present

## 2021-08-02 DIAGNOSIS — Z794 Long term (current) use of insulin: Secondary | ICD-10-CM | POA: Diagnosis not present

## 2021-08-02 DIAGNOSIS — E1169 Type 2 diabetes mellitus with other specified complication: Secondary | ICD-10-CM

## 2021-08-02 DIAGNOSIS — R790 Abnormal level of blood mineral: Secondary | ICD-10-CM

## 2021-08-02 DIAGNOSIS — F331 Major depressive disorder, recurrent, moderate: Secondary | ICD-10-CM

## 2021-08-02 DIAGNOSIS — I2510 Atherosclerotic heart disease of native coronary artery without angina pectoris: Secondary | ICD-10-CM

## 2021-08-02 DIAGNOSIS — E785 Hyperlipidemia, unspecified: Secondary | ICD-10-CM

## 2021-08-02 DIAGNOSIS — E669 Obesity, unspecified: Secondary | ICD-10-CM

## 2021-08-02 LAB — LIPID PANEL
Cholesterol: 135 mg/dL (ref 0–200)
HDL: 31 mg/dL — ABNORMAL LOW (ref >39.00)
LDL Cholesterol: 74 mg/dL (ref 0–99)
NonHDL: 104.44
Total CHOL/HDL Ratio: 4
Triglycerides: 154 mg/dL — ABNORMAL HIGH (ref 0.0–149.0)
VLDL: 30.8 mg/dL (ref 0.0–40.0)

## 2021-08-02 LAB — COMPREHENSIVE METABOLIC PANEL
ALT: 34 U/L (ref 0–53)
AST: 25 U/L (ref 0–37)
Albumin: 4.8 g/dL (ref 3.5–5.2)
Alkaline Phosphatase: 76 U/L (ref 39–117)
BUN: 10 mg/dL (ref 6–23)
CO2: 28 mEq/L (ref 19–32)
Calcium: 9.7 mg/dL (ref 8.4–10.5)
Chloride: 99 mEq/L (ref 96–112)
Creatinine, Ser: 0.99 mg/dL (ref 0.40–1.50)
GFR: 95.01 mL/min (ref >60.00)
Glucose, Bld: 204 mg/dL — ABNORMAL HIGH (ref 70–99)
Potassium: 4.2 mEq/L (ref 3.5–5.1)
Sodium: 137 mEq/L (ref 135–145)
Total Bilirubin: 0.8 mg/dL (ref 0.2–1.2)
Total Protein: 7.5 g/dL (ref 6.0–8.3)

## 2021-08-02 LAB — HEMOGLOBIN A1C: Hgb A1c MFr Bld: 8.7 % — ABNORMAL HIGH (ref 4.6–6.5)

## 2021-08-02 LAB — MAGNESIUM: Magnesium: 1.8 mg/dL (ref 1.5–2.5)

## 2021-08-02 LAB — MICROALBUMIN / CREATININE URINE RATIO
Creatinine,U: 217.5 mg/dL
Microalb Creat Ratio: 0.9 mg/g (ref 0.0–30.0)
Microalb, Ur: 1.9 mg/dL (ref 0.0–1.9)

## 2021-08-02 LAB — VITAMIN B12: Vitamin B-12: 618 pg/mL (ref 211–911)

## 2021-08-02 MED ORDER — METFORMIN HCL 1000 MG PO TABS: 1000.0000 mg | ORAL_TABLET | Freq: Two times a day (BID) | ORAL | 3 refills | Status: DC

## 2021-08-02 NOTE — Assessment & Plan Note (Signed)
Encouraged healthy diet and lifestyle choices for sustainable weight loss ?

## 2021-08-02 NOTE — Progress Notes (Signed)
Patient ID: Medard Bero, male    DOB: 1981-07-01, 40 y.o.   MRN: [id number]  This visit was conducted in person.  BP 128/80   Pulse 100   Temp 97.9 F (36.6 C) (Temporal)   Ht 6\' 4"  (1.93 m)   Wt 279 lb 1 oz (126.6 kg)   SpO2 98%   BMI 33.97 kg/m    CC: CPE Subjective:   HPI: Sandy Torivio is a 40 y.o. male presenting on 08/02/2021 for Annual Exam   Overall good sugar control.  Has stopped drinking diet sodas.   Preventative: Flu shot yearly  [place] 11/2019, 12/2019, booster x1, bivalent 05/2021 Tdap 04/2014 Pneumovax 05/2014 Seat belt use discussed Sunscreen use discussed. No changing moles on skin.  Sleep - averaging 5-6 hrs/night Non smoker Alcohol - none Dentist Q6 mo Eye exam yearly - due  Occupation: works Engineer, technical sales at [employer]  Edu: AA   Activity: 30 min/day 3x/wk on treadmill as well as free weights, goes to [employer]: good water, fruits/vegetables daily, avoids soda and fast foods      Relevant past medical, surgical, family and social history reviewed and updated as indicated. Interim medical history since our last visit reviewed. Allergies and medications reviewed and updated. Outpatient Medications Prior to Visit  Medication Sig Dispense Refill   aspirin EC 81 MG EC tablet Take 1 tablet (81 mg total) by mouth daily. Swallow whole. 30 tablet 11   atorvastatin (LIPITOR) 80 MG tablet Take 1 tablet (80 mg total) by mouth once daily at bedtime. 90 tablet 2   citalopram (CELEXA) 20 MG tablet TAKE ONE TABLET BY MOUTH DAILY 90 tablet 1   Magnesium 400 MG CAPS Take 1 capsule by mouth daily.     metoprolol succinate (TOPROL-XL) 25 MG 24 hr tablet Take 1.5 tablets (25 mg total) by mouth once daily. 45 tablet 2   Multiple Vitamin (MULTIVITAMIN ADULT) TABS Take 1 tablet by mouth daily. Nature's nutrition Blood sugar support - with cinnamon, chromium, and ALA     Omega-3 Fatty Acids (FISH OIL) 1200 MG CAPS Take 1 capsule by mouth in  the morning and at bedtime.     vitamin B-12 (V-R VITAMIN B-12) 500 MCG tablet Take 1 tablet (500 mcg total) by mouth daily.     glimepiride (AMARYL) 4 MG tablet Take 1 tablet (4 mg total) by mouth daily with breakfast. 90 tablet 2   Insulin Glargine Solostar (LANTUS) 100 UNIT/ML Solostar Pen Inject 10 units under the skin once daily. 15 mL 0   metFORMIN (GLUCOPHAGE) 1000 MG tablet TAKE 1 TABLET BY MOUTH TWICE DAILY WITH MEALS (Patient taking differently: Take 1,000 mg by mouth 2 (two) times daily with a meal.) 180 tablet 3   metFORMIN (GLUCOPHAGE) 1000 MG tablet Take 1 tablet (1,000 mg total) by mouth 2 (two) times daily with meals. 180 tablet 0   glimepiride (AMARYL) 4 MG tablet Take 1 tablet (4 mg total) by mouth daily with breakfast. 90 tablet 3   Insulin Glargine Solostar (LANTUS) 100 UNIT/ML Solostar Pen Inject 22 Units into the skin daily.     atorvastatin (LIPITOR) 80 MG tablet Take 1 tablet (80 mg total) by mouth at bedtime. 90 tablet 3   lisinopril (ZESTRIL) 5 MG tablet Take 1 tablet (5 mg total) by mouth daily. 90 tablet 3   lisinopril (ZESTRIL) 5 MG tablet Take 1 tablet (5 mg total) by mouth once daily. 90 tablet 1  metoprolol succinate (TOPROL XL) 25 MG 24 hr tablet Take 1.5 tablets (37.5 mg total) by mouth daily. 45 tablet 6   No facility-administered medications prior to visit.     Per HPI unless specifically indicated in ROS section below Review of Systems  Constitutional:  Negative for activity change, appetite change, chills, fatigue, fever and unexpected weight change.  HENT:  Negative for hearing loss.   Eyes:  Negative for visual disturbance.  Respiratory:  Negative for cough, chest tightness, shortness of breath and wheezing.   Cardiovascular:  Negative for chest pain, palpitations and leg swelling.  Gastrointestinal:  Negative for abdominal distention, abdominal pain, blood in stool, constipation, diarrhea, nausea and vomiting.  Genitourinary:  Negative for difficulty  urinating and hematuria.  Musculoskeletal:  Negative for arthralgias, myalgias and neck pain.  Skin:  Negative for rash.  Neurological:  Negative for dizziness, seizures, syncope and headaches.  Hematological:  Negative for adenopathy. Does not bruise/bleed easily.  Psychiatric/Behavioral:  Negative for dysphoric mood. The patient is not nervous/anxious.    Objective:  BP 128/80   Pulse 100   Temp 97.9 F (36.6 C) (Temporal)   Ht 6\' 4"  (1.93 m)   Wt 279 lb 1 oz (126.6 kg)   SpO2 98%   BMI 33.97 kg/m   Wt Readings from Last 3 Encounters:  08/02/21 279 lb 1 oz (126.6 kg)  04/14/21 269 lb 3 oz (122.1 kg)  01/13/21 274 lb (124.3 kg)      Physical Exam Vitals and nursing note reviewed.  Constitutional:      General: He is not in acute distress.    Appearance: Normal appearance. He is well-developed. He is not ill-appearing.  HENT:     Head: Normocephalic and atraumatic.     Right Ear: Hearing, tympanic membrane, ear canal and external ear normal.     Left Ear: Hearing, tympanic membrane, ear canal and external ear normal.  Eyes:     General: No scleral icterus.    Extraocular Movements: Extraocular movements intact.     Conjunctiva/sclera: Conjunctivae normal.     Pupils: Pupils are equal, round, and reactive to light.  Neck:     Thyroid: No thyroid mass or thyromegaly.  Cardiovascular:     Rate and Rhythm: Normal rate and regular rhythm.     Pulses: Normal pulses.          Radial pulses are 2+ on the right side and 2+ on the left side.     Heart sounds: Normal heart sounds. No murmur heard. Pulmonary:     Effort: Pulmonary effort is normal. No respiratory distress.     Breath sounds: Normal breath sounds. No wheezing, rhonchi or rales.  Abdominal:     General: Bowel sounds are normal. There is no distension.     Palpations: Abdomen is soft. There is no mass.     Tenderness: There is no abdominal tenderness. There is no guarding or rebound.     Hernia: No hernia is  present.  Musculoskeletal:        General: Normal range of motion.     Cervical back: Normal range of motion and neck supple.     Right lower leg: No edema.     Left lower leg: No edema.  Lymphadenopathy:     Cervical: No cervical adenopathy.  Skin:    General: Skin is warm and dry.     Findings: No rash.  Neurological:     General: No focal deficit present.  Mental Status: He is alert and oriented to person, place, and time.  Psychiatric:        Mood and Affect: Mood normal.        Behavior: Behavior normal.        Thought Content: Thought content normal.        Judgment: Judgment normal.      Results for orders placed or performed in visit on 04/14/21  POCT glycosylated hemoglobin (Hb A1C)  Result Value Ref Range   Hemoglobin A1C 8.2 (A) 4.0 - 5.6 %   HbA1c POC (<> result, manual entry)     HbA1c, POC (prediabetic range)     HbA1c, POC (controlled diabetic range)      Assessment & Plan:  This visit occurred during the SARS-CoV-2 public health emergency.  Safety protocols were in place, including screening questions prior to the visit, additional usage of staff PPE, and extensive cleaning of exam room while observing appropriate contact time as indicated for disinfecting solutions.   Problem List Items Addressed This Visit     Health maintenance examination - Primary (Chronic)    Preventative protocols reviewed and updated unless pt declined. Discussed healthy diet and lifestyle.       Dyslipidemia associated with type 2 diabetes mellitus ([place])    Update FLP on high potency statin. The ASCVD Risk score (Arnett DK, et al., 2019) failed to calculate for the following reasons:   The patient has a prior MI or stroke diagnosis       Relevant Medications   metFORMIN (GLUCOPHAGE) 1000 MG tablet   Insulin Glargine Solostar (LANTUS) 100 UNIT/ML Solostar Pen   NAFLD (nonalcoholic fatty liver disease)    Update LFTs      HTN (hypertension)    Chronic, stable on current  regimen.  ACEI stopped by cards.  Consider low dose ACE for renoprotection.  Update Umicroalb today       Relevant Orders   Microalbumin / creatinine urine ratio   Type 2 diabetes mellitus with diabetic neuropathy, unspecified (HCC)    Chronic. Update A1c and Umicroalb today.  He apparently was not eligible for patient assistance for weekly GLP1RA due to current insurance. He is looking into Hospital doctor. Consider [facility]. Continue current regimen for now.       Relevant Medications   metFORMIN (GLUCOPHAGE) 1000 MG tablet   Insulin Glargine Solostar (LANTUS) 100 UNIT/ML Solostar Pen   Obesity, Class I, BMI 30.0-34.9 (see actual BMI)    Encouraged healthy diet and lifestyle choices for sustainable weight loss.       Major depressive disorder, recurrent episode, moderate (HCC)    Stable period on current regimen - continue celexa.       Vitamin B12 deficiency    Continue B12 replacement.       Relevant Orders   Vitamin B12   CAD (coronary artery disease)    NSTEMI 11/2020 s/p DES to RCA.  Now followed by cardiology.       Other Visit Diagnoses     Low magnesium level       Relevant Orders   Magnesium        Meds ordered this encounter  Medications   metFORMIN (GLUCOPHAGE) 1000 MG tablet    Sig: Take 1 tablet (1,000 mg total) by mouth 2 (two) times daily.    Dispense:  180 tablet    Refill:  3   Orders Placed This Encounter  Procedures   Microalbumin / creatinine urine ratio  Vitamin B12   Magnesium    Patient instructions: Labs today  Increase sleep time.  Call eye doctor or insurance to ask if being diabetic they would cover a yearly diabetic eye exam.  Good to see you today Return as needed or in 3-4 months for follow up visit on diabetes.   Follow up plan: Return in about 4 months (around 11/30/2021) for follow up visit.  Ria Bush, MD

## 2021-08-02 NOTE — Patient Instructions (Addendum)
Labs today  Increase sleep time.  Call eye doctor or insurance to ask if being diabetic they would cover a yearly diabetic eye exam.  Good to see you today Return as needed or in 3-4 months for follow up visit on diabetes.   Health Maintenance, Male Adopting a healthy lifestyle and getting preventive care are important in promoting health and wellness. Ask your health care provider about: The right schedule for you to have regular tests and exams. Things you can do on your own to prevent diseases and keep yourself healthy. What should I know about diet, weight, and exercise? Eat a healthy diet  Eat a diet that includes plenty of vegetables, fruits, low-fat dairy products, and lean protein. Do not eat a lot of foods that are high in solid fats, added sugars, or sodium. Maintain a healthy weight Body mass index (BMI) is a measurement that can be used to identify possible weight problems. It estimates body fat based on height and weight. Your health care provider can help determine your BMI and help you achieve or maintain a healthy weight. Get regular exercise Get regular exercise. This is one of the most important things you can do for your health. Most adults should: Exercise for at least 150 minutes each week. The exercise should increase your heart rate and make you sweat (moderate-intensity exercise). Do strengthening exercises at least twice a week. This is in addition to the moderate-intensity exercise. Spend less time sitting. Even light physical activity can be beneficial. Watch cholesterol and blood lipids Have your blood tested for lipids and cholesterol at 41 years of age, then have this test every 5 years. You may need to have your cholesterol levels checked more often if: Your lipid or cholesterol levels are high. You are older than 41 years of age. You are at high risk for heart disease. What should I know about cancer screening? Many types of cancers can be detected early  and may often be prevented. Depending on your health history and family history, you may need to have cancer screening at various ages. This may include screening for: Colorectal cancer. Prostate cancer. Skin cancer. Lung cancer. What should I know about heart disease, diabetes, and high blood pressure? Blood pressure and heart disease High blood pressure causes heart disease and increases the risk of stroke. This is more likely to develop in people who have high blood pressure readings or are overweight. Talk with your health care provider about your target blood pressure readings. Have your blood pressure checked: Every 3-5 years if you are 65-52 years of age. Every year if you are 64 years old or older. If you are between the ages of 33 and 15 and are a current or former smoker, ask your health care provider if you should have a one-time screening for abdominal aortic aneurysm (AAA). Diabetes Have regular diabetes screenings. This checks your fasting blood sugar level. Have the screening done: Once every three years after age 41 if you are at a normal weight and have a low risk for diabetes. More often and at a younger age if you are overweight or have a high risk for diabetes. What should I know about preventing infection? Hepatitis B If you have a higher risk for hepatitis B, you should be screened for this virus. Talk with your health care provider to find out if you are at risk for hepatitis B infection. Hepatitis C Blood testing is recommended for: Everyone born from [birth year] through 1966. Anyone  with known risk factors for hepatitis C. Sexually transmitted infections (STIs) You should be screened each year for STIs, including gonorrhea and chlamydia, if: You are sexually active and are younger than 41 years of age. You are older than 41 years of age and your health care provider tells you that you are at risk for this type of infection. Your sexual activity has changed since you were  last screened, and you are at increased risk for chlamydia or gonorrhea. Ask your health care provider if you are at risk. Ask your health care provider about whether you are at high risk for HIV. Your health care provider may recommend a prescription medicine to help prevent HIV infection. If you choose to take medicine to prevent HIV, you should first get tested for HIV. You should then be tested every 3 months for as long as you are taking the medicine. Follow these instructions at home: Alcohol use Do not drink alcohol if your health care provider tells you not to drink. If you drink alcohol: Limit how much you have to 0-2 drinks a day. Know how much alcohol is in your drink. In the [place] one drink equals one 12 oz bottle of beer (355 mL), one 5 oz glass of wine (148 mL), or one 1 oz glass of hard liquor (44 mL). Lifestyle Do not use any products that contain nicotine or tobacco. These products include cigarettes, chewing tobacco, and vaping devices, such as e-cigarettes. If you need help quitting, ask your health care provider. Do not use street drugs. Do not share needles. Ask your health care provider for help if you need support or information about quitting drugs. General instructions Schedule regular health, dental, and eye exams. Stay current with your vaccines. Tell your health care provider if: You often feel depressed. You have ever been abused or do not feel safe at home. Summary Adopting a healthy lifestyle and getting preventive care are important in promoting health and wellness. Follow your health care provider's instructions about healthy diet, exercising, and getting tested or screened for diseases. Follow your health care provider's instructions on monitoring your cholesterol and blood pressure. This information is not intended to replace advice given to you by your health care provider. Make sure you discuss any questions you have with your health care  provider. Document Revised: 01/25/2021 Document Reviewed: 01/25/2021 Elsevier Patient Education  2022 [employer].

## 2021-08-02 NOTE — Assessment & Plan Note (Signed)
Update LFT's 

## 2021-08-02 NOTE — Assessment & Plan Note (Signed)
Stable period on current regimen - continue celexa.

## 2021-08-02 NOTE — Assessment & Plan Note (Signed)
Chronic. Update A1c and Umicroalb today.  He apparently was not eligible for patient assistance for weekly GLP1RA due to current insurance. He is looking into Public affairs consultant. Consider Mounjaro. Continue current regimen for now.

## 2021-08-02 NOTE — Assessment & Plan Note (Signed)
Continue B12 replacement. ?

## 2021-08-02 NOTE — Assessment & Plan Note (Signed)
Chronic, stable on current regimen.  ACEI stopped by cards.  Consider low dose ACE for renoprotection.  Update Umicroalb today

## 2021-08-02 NOTE — Assessment & Plan Note (Signed)
Preventative protocols reviewed and updated unless pt declined. Discussed healthy diet and lifestyle.  

## 2021-08-02 NOTE — Assessment & Plan Note (Signed)
Update FLP on high potency statin. The ASCVD Risk score (Arnett DK, et al., 2019) failed to calculate for the following reasons:   The patient has a prior MI or stroke diagnosis

## 2021-08-02 NOTE — Assessment & Plan Note (Signed)
NSTEMI 11/2020 s/p DES to RCA.  Now followed by cardiology.

## 2021-08-03 ENCOUNTER — Other Ambulatory Visit: Payer: Self-pay | Admitting: Family Medicine

## 2021-08-03 ENCOUNTER — Telehealth: Payer: PRIVATE HEALTH INSURANCE

## 2021-08-09 ENCOUNTER — Encounter: Payer: Self-pay | Admitting: Family Medicine

## 2021-08-09 NOTE — Telephone Encounter (Signed)
Updated pt's chart.  

## 2021-08-10 ENCOUNTER — Encounter: Payer: Self-pay | Admitting: Family Medicine

## 2021-08-10 LAB — HM DIABETES EYE EXAM

## 2021-08-19 ENCOUNTER — Ambulatory Visit: Payer: PRIVATE HEALTH INSURANCE

## 2021-08-19 DIAGNOSIS — E1140 Type 2 diabetes mellitus with diabetic neuropathy, unspecified: Secondary | ICD-10-CM

## 2021-08-19 DIAGNOSIS — I10 Essential (primary) hypertension: Secondary | ICD-10-CM

## 2021-08-19 DIAGNOSIS — I2510 Atherosclerotic heart disease of native coronary artery without angina pectoris: Secondary | ICD-10-CM

## 2021-08-19 DIAGNOSIS — Z794 Long term (current) use of insulin: Secondary | ICD-10-CM

## 2021-08-19 NOTE — Chronic Care Management (AMB) (Deleted)
Chronic Care Management   CCM RN Visit Note  08/19/2021 Name: James [name] MRN: [id number] DOB: 1981-05-23  Subjective: James [name] is a 40 y.o. year old male who is a primary care patient of Ria Bush, MD. The care management team was consulted for assistance with disease management and care coordination needs.    Engaged with patient by telephone for follow up visit in response to provider referral for case management and/or care coordination services.   Consent to Services:  The patient was given information about Chronic Care Management services, agreed to services, and gave verbal consent prior to initiation of services.  Please see initial visit note for detailed documentation.   Patient agreed to services and verbal consent obtained.   Assessment: Review of patient past medical history, allergies, medications, health status, including review of consultants reports, laboratory and other test data, was performed as part of comprehensive evaluation and provision of chronic care management services.   SDOH (Social Determinants of Health) assessments and interventions performed:    CCM Care Plan  No Known Allergies  Outpatient Encounter Medications as of 08/19/2021  Medication Sig Note   aspirin EC 81 MG EC tablet Take 1 tablet (81 mg total) by mouth daily. Swallow whole.    atorvastatin (LIPITOR) 80 MG tablet Take 1 tablet (80 mg total) by mouth once daily at bedtime.    citalopram (CELEXA) 20 MG tablet TAKE ONE TABLET BY MOUTH DAILY    glimepiride (AMARYL) 4 MG tablet Take 1 tablet (4 mg total) by mouth daily with breakfast.    Magnesium 400 MG CAPS Take 1 capsule by mouth daily.    metFORMIN (GLUCOPHAGE) 1000 MG tablet Take 1 tablet (1,000 mg total) by mouth 2 (two) times daily.    metoprolol succinate (TOPROL-XL) 25 MG 24 hr tablet Take 1.5 tablets (25 mg total) by mouth once daily.    Multiple Vitamin (MULTIVITAMIN ADULT) TABS Take 1 tablet by mouth daily.  Nature's nutrition Blood sugar support - with cinnamon, chromium, and ALA    Omega-3 Fatty Acids (FISH OIL) 1200 MG CAPS Take 1 capsule by mouth in the morning and at bedtime. 08/19/2021: Patient states he is taking 2  1200 mg daily   vitamin B-12 (V-R VITAMIN B-12) 500 MCG tablet Take 1 tablet (500 mcg total) by mouth daily.    Insulin Glargine Solostar (LANTUS) 100 UNIT/ML Solostar Pen Inject 22 Units into the skin daily. 08/19/2021: Patient states he was increased to 25 units daily   No facility-administered encounter medications on file as of 08/19/2021.    Patient Active Problem List   Diagnosis Date Noted   CAD (coronary artery disease) 11/18/2020   Tachycardia 06/19/2020   Left leg pain 02/28/2020   OSA (obstructive sleep apnea) 83/15/1761   Folliculitis 60/73/7106   Grade 3 hypertensive retinopathy, left 03/01/2019   Left shoulder pain 05/29/2017   Vitamin B12 deficiency 03/18/2016   Major depressive disorder, recurrent episode, moderate ([place]) 12/26/2015   Foot pain, bilateral 02/03/2015   Health maintenance examination 12/09/2014   HTN (hypertension)    Type 2 diabetes mellitus with diabetic neuropathy, unspecified ([facility]CC)    Obesity, Class I, BMI 30.0-34.9 (see actual BMI)    NAFLD (nonalcoholic fatty liver disease) 04/17/2013   Dyslipidemia associated with type 2 diabetes mellitus ([place]) 11/20/2010    Conditions to be addressed/monitored:CAD, HTN, HLD, and DMII  Care Plan : [facility] plan of care  Updates made by Dannielle Karvonen, RN since 08/19/2021 12:00 AM  Problem: Chronic disease management, education, and / or care coordination needs   Priority: High     Long-Range Goal: Development of plan of care to address chronic disease management and/ or care coordination needs.   Start Date: 08/19/2021  Expected End Date: 11/16/2021  Priority: High  Note:   Current Barriers:  Knowledge Deficits related to plan of care for management of CAD, HTN, HLD, and DMII  Chronic Disease  Management support and education needs related to CAD, HTN, HLD, and DMII Patient reports having follow up  visit with cardiologist on 08/18/2021.  No change in treatment plan per patient. Next follow up in 6 months.  Patient reports having follow up with primary care provider on 08/02/2021.  He states his insulin was increased to 25 units/ day.  He reports most recent Hgb A1c is 8.7.  Patient states he has not been checking his blood sugars regularly.  RNCM discussed importance of daily monitoring and recording of blood sugars.  Patient states he is exercising 5 days a week at the gym doing cardio and weights.  Patient reports minor adjustment with diet such as eliminating diet soda.  Discussed importance of incorporating heart healthy food choices in diet regiment. Patient verbalized understanding.  RNCM Clinical Goal(s):  Patient will verbalize basic understanding of CAD, HTN, HLD, and DMII disease process and self health management plan as evidenced by patient report and / or notation in chart take all medications exactly as prescribed and will call provider for medication related questions as evidenced by patient report and/ or notation in chart    demonstrate understanding of rationale for each prescribed medication as evidenced by patient report and/ or notation in chart    continue to work with Consulting civil engineer and/or Social Worker to address care management and care coordination needs related to CAD, HTN, HLD, and DMII as evidenced by adherence to CM Team Scheduled appointments     through collaboration with Consulting civil engineer, provider, and care team.   Interventions: 1:1 collaboration with primary care provider regarding development and update of comprehensive plan of care as evidenced by provider attestation and co-signature Inter-disciplinary care team collaboration (see longitudinal plan of care) Evaluation of current treatment plan related to  self management and patient's adherence to plan as  established by provider   CAD Interventions: (Status:  Goal on track:  Yes.) Long Term Goal Assessed understanding of CAD diagnosis Medications reviewed including medications utilized in CAD treatment plan Provided education on importance of blood pressure control in management of CAD Provided education on Importance of limiting foods high in cholesterol Reviewed Importance of taking all medications as prescribed Reviewed Importance of attending all scheduled provider appointments   Diabetes Interventions:  (Status:  Goal on track:  Yes.) Long Term Goal Assessed patient's understanding of A1c goal: <7% Reviewed medications with patient and discussed importance of medication adherence Discussed plans with patient for ongoing care management follow up and provided patient with direct contact information for care management team Provided patient with written educational materials related to hypo and hyperglycemia and importance of correct treatment Reviewed scheduled/upcoming provider appointments including:   Discussed importance of monitoring blood sugars daily and recording Lab Results  Component Value Date   HGBA1C 8.7 Repeated and verified X2. (H) 08/02/2021   Hyperlipidemia Interventions:  (Status:  Goal on track:  Yes.) Long Term Goal Medication review performed; medication list updated in electronic medical record.  Provided HLD educational materials Reviewed importance of limiting foods high in  cholesterol  Hypertension Interventions:  (Status:  Goal on track:  Yes.) Long Term Goal Last practice recorded BP readings:  BP Readings from Last 3 Encounters:  08/02/21 128/80  04/14/21 126/70  01/13/21 130/72  Most recent eGFR/CrCl: No results found for: EGFR  No components found for: CRCL  Evaluation of current treatment plan related to hypertension self management and patient's adherence to plan as established by provider Reviewed medications with patient and discussed  importance of compliance Discussed plans with patient for ongoing care management follow up and provided patient with direct contact information for care management team Advised patient, providing education and rationale, to monitor blood pressure daily and record, calling PCP for findings outside established parameters Reviewed scheduled/upcoming provider appointments including:   Mailed patient education material on heart healthy diet.   Patient Goals/Self-Care Activities: Take medications as prescribed   Attend all scheduled provider appointments Call pharmacy for medication refills 3-7 days in advance of running out of medications Call provider office for new concerns or questions  Check blood sugars daily and record.  Report consist blood sugars <70 or over 200. Review education articles on heart healthy diet, Hyper/ hypoglycemia, and Hyperlipidemia ( high blood cholesterol) Monitor blood pressure at least 1 time per week and record       Plan:The patient has been provided with contact information for the care management team and has been advised to call with any health related questions or concerns.  The care management team will reach out to the patient again over the next 2-3 months. Quinn Plowman RN,BSN,CCM RN Case Manager [place]  [phone]

## 2021-08-19 NOTE — Patient Instructions (Signed)
Visit Information   Thank you for taking time to visit with me today. Please don't hesitate to contact me if I can be of assistance to you before our next scheduled telephone appointment.  Following are the goals we discussed today:  (Copy and paste patient goals from clinical care plan here)  Our next appointment is by telephone on November 04, 2021 at 2:00 pm  Please call the care guide team at [phone] if you need to cancel or reschedule your appointment.   If you are experiencing a Mental Health or [place] or need someone to talk to, please call the Suicide and Crisis Lifeline: 988 call the [place] National Suicide Prevention Lifeline: 641 431 8442 or TTY: (516)445-4895 TTY (412) 524-2310) to talk to a trained counselor call 1-800-273-TALK (toll free, 24 hour hotline)     Heart-Healthy Eating Plan Heart-healthy meal planning includes: Eating less unhealthy fats. Eating more healthy fats. Making other changes in your diet. Talk with your doctor or a diet specialist (dietitian) to create an eating plan that is right for you. What is my plan? Your doctor may recommend an eating plan that includes: Total fat: ______% or less of total calories a day. Saturated fat: ______% or less of total calories a day. Cholesterol: less than _________mg a day. What are tips for following this plan? Cooking Avoid frying your food. Try to bake, boil, grill, or broil it instead. You can also reduce fat by: Removing the skin from poultry. Removing all visible fats from meats. Steaming vegetables in water or broth. Meal planning  At meals, divide your plate into four equal parts: Fill one-half of your plate with vegetables and James [name] salads. Fill one-fourth of your plate with whole grains. Fill one-fourth of your plate with lean protein foods. Eat 4-5 servings of vegetables per day. A serving of vegetables is: 1 cup of raw or cooked vegetables. 2 cups of raw leafy greens. Eat  4-5 servings of fruit per day. A serving of fruit is: 1 medium whole fruit.  cup of dried fruit.  cup of fresh, frozen, or canned fruit.  cup of 100% fruit juice. Eat more foods that have soluble fiber. These are apples, broccoli, carrots, beans, peas, and barley. Try to get 20-30 g of fiber per day. Eat 4-5 servings of nuts, legumes, and seeds per week: 1 serving of dried beans or legumes equals  cup after being cooked. 1 serving of nuts is  cup. 1 serving of seeds equals 1 tablespoon. General information Eat more home-cooked food. Eat less restaurant, buffet, and fast food. Limit or avoid alcohol. Limit foods that are high in starch and sugar. Avoid fried foods. Lose weight if you are overweight. Keep track of how much salt (sodium) you eat. This is important if you have high blood pressure. Ask your doctor to tell you more about this. Try to add vegetarian meals each week. Fats Choose healthy fats. These include olive oil and canola oil, flaxseeds, walnuts, almonds, and seeds. Eat more omega-3 fats. These include salmon, mackerel, sardines, tuna, flaxseed oil, and ground flaxseeds. Try to eat fish at least 2 times each week. Check food labels. Avoid foods with trans fats or high amounts of saturated fat. Limit saturated fats. These are often found in animal products, such as meats, butter, and cream. These are also found in plant foods, such as palm oil, palm kernel oil, and coconut oil. Avoid foods with partially hydrogenated oils in them. These have trans fats. Examples are stick margarine,  some tub margarines, cookies, crackers, and other baked goods. What foods can I eat? Fruits All fresh, canned (in natural juice), or frozen fruits. Vegetables Fresh or frozen vegetables (raw, steamed, roasted, or grilled). James [name] salads. Grains Most grains. Choose whole wheat and whole grains most of the time. Rice and pasta, including brown rice and pastas made with whole wheat. Meats  and other proteins Lean, well-trimmed beef, veal, pork, and lamb. Chicken and [place] without skin. All fish and shellfish. Wild duck, rabbit, pheasant, and venison. Egg whites or low-cholesterol egg substitutes. Dried beans, peas, lentils, and tofu. Seeds and most nuts. Dairy Low-fat or nonfat cheeses, including ricotta and mozzarella. Skim or 1% milk that is liquid, powdered, or evaporated. Buttermilk that is made with low-fat milk. Nonfat or low-fat yogurt. Fats and oils Non-hydrogenated (trans-free) margarines. Vegetable oils, including soybean, sesame, sunflower, olive, peanut, safflower, corn, canola, and cottonseed. Salad dressings or mayonnaise made with a vegetable oil. Beverages Mineral water. Coffee and tea. Diet carbonated beverages. Sweets and desserts Sherbet, gelatin, and fruit ice. Small amounts of dark chocolate. Limit all sweets and desserts. Seasonings and condiments All seasonings and condiments. The items listed above may not be a complete list of foods and drinks you can eat. Contact a dietitian for more options. What foods should I avoid? Fruits Canned fruit in heavy syrup. Fruit in cream or butter sauce. Fried fruit. Limit coconut. Vegetables Vegetables cooked in cheese, cream, or butter sauce. Fried vegetables. Grains Breads that are made with saturated or trans fats, oils, or whole milk. Croissants. Sweet rolls. Donuts. High-fat crackers, such as cheese crackers. Meats and other proteins Fatty meats, such as hot dogs, ribs, sausage, bacon, rib-eye roast or steak. High-fat deli meats, such as salami and bologna. Caviar. Domestic duck and goose. Organ meats, such as liver. Dairy Cream, sour cream, cream cheese, and creamed cottage cheese. Whole-milk cheeses. Whole or 2% milk that is liquid, evaporated, or condensed. Whole buttermilk. Cream sauce or high-fat cheese sauce. Yogurt that is made from whole milk. Fats and oils Meat fat, or shortening. Cocoa butter,  hydrogenated oils, palm oil, coconut oil, palm kernel oil. Solid fats and shortenings, including bacon fat, salt pork, lard, and butter. Nondairy cream substitutes. Salad dressings with cheese or sour cream. Beverages Regular sodas and juice drinks with added sugar. Sweets and desserts Frosting. Pudding. Cookies. Cakes. Pies. Milk chocolate or white chocolate. Buttered syrups. Full-fat ice cream or ice cream drinks. The items listed above may not be a complete list of foods and drinks to avoid. Contact a dietitian for more information. Summary Heart-healthy meal planning includes eating less unhealthy fats, eating more healthy fats, and making other changes in your diet. Eat a balanced diet. This includes fruits and vegetables, low-fat or nonfat dairy, lean protein, nuts and legumes, whole grains, and heart-healthy oils and fats. This information is not intended to replace advice given to you by your health care provider. Make sure you discuss any questions you have with your health care provider. Document Revised: 01/14/2021 Document Reviewed: 01/14/2021 Elsevier Patient Education  2022 [place].  High Cholesterol High cholesterol is a condition in which the blood has high levels of a white, waxy substance similar to fat (cholesterol). The liver makes all the cholesterol that the body needs. The human body needs small amounts of cholesterol to help build cells. A person gets extra or excess cholesterol from the food that he or she eats. The blood carries cholesterol from the liver to the rest of  the body. If you have high cholesterol, deposits (plaques) may build up on the walls of your arteries. Arteries are the blood vessels that carry blood away from your heart. These plaques make the arteries narrow and stiff. Cholesterol plaques increase your risk for heart attack and stroke. Work with your health care provider to keep your cholesterol levels in a healthy range. What increases the  risk? The following factors may make you more likely to develop this condition: Eating foods that are high in animal fat (saturated fat) or cholesterol. Being overweight. Not getting enough exercise. A family history of high cholesterol (familial hypercholesterolemia). Use of tobacco products. Having diabetes. What are the signs or symptoms? In most cases, high cholesterol does not usually cause any symptoms. In severe cases, very high cholesterol levels can cause: Fatty bumps under the skin (xanthomas). A white or gray ring around the black center (pupil) of the eye. How is this diagnosed? This condition may be diagnosed based on the results of a blood test. If you are older than 41 years of age, your health care provider may check your cholesterol levels every 4-6 years. You may be checked more often if you have high cholesterol or other risk factors for heart disease. The blood test for cholesterol measures: "Bad" cholesterol, or LDL cholesterol. This is the main type of cholesterol that causes heart disease. The desired level is less than 100 mg/dL (2.59 mmol/L). "Good" cholesterol, or HDL cholesterol. HDL helps protect against heart disease by cleaning the arteries and carrying the LDL to the liver for processing. The desired level for HDL is 60 mg/dL (1.55 mmol/L) or higher. Triglycerides. These are fats that your body can store or burn for energy. The desired level is less than 150 mg/dL (1.69 mmol/L). Total cholesterol. This measures the total amount of cholesterol in your blood and includes LDL, HDL, and triglycerides. The desired level is less than 200 mg/dL (5.17 mmol/L). How is this treated? Treatment for high cholesterol starts with lifestyle changes, such as diet and exercise. Diet changes. You may be asked to eat foods that have more fiber and less saturated fats or added sugar. Lifestyle changes. These may include regular exercise, maintaining a healthy weight, and quitting  use of tobacco products. Medicines. These are given when diet and lifestyle changes have not worked. You may be prescribed a statin medicine to help lower your cholesterol levels. Follow these instructions at home: Eating and drinking  Eat a healthy, balanced diet. This diet includes: Daily servings of a variety of fresh, frozen, or canned fruits and vegetables. Daily servings of whole grain foods that are rich in fiber. Foods that are low in saturated fats and trans fats. These include poultry and fish without skin, lean cuts of meat, and low-fat dairy products. A variety of fish, especially oily fish that contain omega-3 fatty acids. Aim to eat fish at least 2 times a week. Avoid foods and drinks that have added sugar. Use healthy cooking methods, such as roasting, grilling, broiling, baking, poaching, steaming, and stir-frying. Do not fry your food except for stir-frying. If you drink alcohol: Limit how much you have to: 0-1 drink a day for women who are not pregnant. 0-2 drinks a day for men. Know how much alcohol is in a drink. In the [place] one drink equals one 12 oz bottle of beer (355 mL), one 5 oz glass of wine (148 mL), or one 1 oz glass of hard liquor (44 mL). Lifestyle  Get regular  exercise. Aim to exercise for a total of 150 minutes a week. Increase your activity level by doing activities such as gardening, walking, and taking the stairs. Do not use any products that contain nicotine or tobacco. These products include cigarettes, chewing tobacco, and vaping devices, such as e-cigarettes. If you need help quitting, ask your health care provider. General instructions Take over-the-counter and prescription medicines only as told by your health care provider. Keep all follow-up visits. This is important. Where to find more information American Heart Association: www.heart.org National Heart, Lung, and Blood Institute: https://wilson-eaton.com/ Contact a health care provider if: You have  trouble achieving or maintaining a healthy diet or weight. You are starting an exercise program. You are unable to stop smoking. Get help right away if: You have chest pain. You have trouble breathing. You have discomfort or pain in your jaw, neck, back, shoulder, or arm. You have any symptoms of a stroke. "BE FAST" is an easy way to remember the main warning signs of a stroke: B - Balance. Signs are dizziness, sudden trouble walking, or loss of balance. E - Eyes. Signs are trouble seeing or a sudden change in vision. F - Face. Signs are sudden weakness or numbness of the face, or the face or eyelid drooping on one side. A - Arms. Signs are weakness or numbness in an arm. This happens suddenly and usually on one side of the body. S - Speech. Signs are sudden trouble speaking, slurred speech, or trouble understanding what people say. T - Time. Time to call emergency services. Write down what time symptoms started. You have other signs of a stroke, such as: A sudden, severe headache with no known cause. Nausea or vomiting. Seizure. These symptoms may represent a serious problem that is an emergency. Do not wait to see if the symptoms will go away. Get medical help right away. Call your local emergency services (911 in the [place] Do not drive yourself to the hospital. Summary Cholesterol plaques increase your risk for heart attack and stroke. Work with your health care provider to keep your cholesterol levels in a healthy range. Eat a healthy, balanced diet, get regular exercise, and maintain a healthy weight. Do not use any products that contain nicotine or tobacco. These products include cigarettes, chewing tobacco, and vaping devices, such as e-cigarettes. Get help right away if you have any symptoms of a stroke. This information is not intended to replace advice given to you by your health care provider. Make sure you discuss any questions you have with your health care provider. Document  Revised: 11/19/2020 Document Reviewed: 11/09/2020 Elsevier Patient Education  2022 [place].  Diabetes Mellitus Action Plan Following a diabetes action plan is a way for you to manage your diabetes (diabetes mellitus) symptoms. The plan is color-coded to help you understand what actions you need to take based on any symptoms you are having. If you have symptoms in the red zone, you need medical care right away. If you have symptoms in the yellow zone, you are having problems. If you have symptoms in the James [name] zone, you are doing well. Learning about and understanding diabetes can take time. Follow the plan that you develop with your health care provider. Know the target range for your blood sugar (glucose) level, and review your treatment plan with your health care provider at each visit. The target range for my blood sugar level is __________________________ mg/dL. Red zone Get medical help right away if you have any of  the following symptoms: A blood sugar test result that is below 54 mg/dL (3 mmol/L). A blood sugar test result that is at or above 240 mg/dL (13.3 mmol/L) for 2 days in a row. Confusion or trouble thinking clearly. Difficulty breathing. Sickness or a fever for 2 or more days that is not getting better. Moderate or large ketone levels in your urine. Feeling tired or having no energy. If you have any red zone symptoms, do not wait to see if the symptoms will go away. Get medical help right away. Call your local emergency services (911 in the [place] Do not drive yourself to the hospital. If you have severely low blood sugar (severe hypoglycemia) and you cannot eat or drink, you may need glucagon. Make sure a family member or close friend knows how to check your blood sugar and how to give you glucagon. You may need to be treated in a hospital for this condition. Yellow zone If you have any of the following symptoms, your diabetes is not under control and you may need to make  some changes: A blood sugar test result that is at or above 240 mg/dL (13.3 mmol/L) for 2 days in a row. Blood sugar test results that are below 70 mg/dL (3.9 mmol/L). Other symptoms of hypoglycemia, such as: Shaking or feeling light-headed. Confusion or irritability. Feeling hungry. Having a fast heartbeat. If you have any yellow zone symptoms: Treat your hypoglycemia by eating or drinking 15 grams of a rapid-acting carbohydrate. Follow the 15:15 rule: Take 15 grams of a rapid-acting carbohydrate, such as: 1 tube of glucose gel. 4 glucose pills. 4 oz (120 mL) of fruit juice. 4 oz (120 mL) of regular (not diet) soda. Check your blood sugar 15 minutes after you take the carbohydrate. If the repeat blood sugar test is still at or below 70 mg/dL (3.9 mmol/L), take 15 grams of a carbohydrate again. If your blood sugar does not increase above 70 mg/dL (3.9 mmol/L) after 3 tries, get medical help right away. After your blood sugar returns to normal, eat a meal or a snack within 1 hour. Keep taking your daily medicines as told by your health care provider. Check your blood sugar more often than you normally would. Write down your results. Call your health care provider if you have trouble keeping your blood sugar in your target range.  [name] zone These signs mean you are doing well and you can continue what you are doing to manage your diabetes: Your blood sugar is within your personal target range. For most people, a blood sugar level before a meal (preprandial) should be 80-130 mg/dL (4.4-7.2 mmol/L). You feel well, and you are able to do daily activities. If you are in the [place] [name] zone, continue to manage your diabetes as told by your health care provider. To do this: Eat a healthy diet. Exercise regularly. Check your blood sugar as told by your health care provider. Take your medicines as told by your health care provider.  Where to find more information American Diabetes Association  (ADA): diabetes.org Association of Diabetes Care & Education Specialists (ADCES): diabeteseducator.org Summary Following a diabetes action plan is a way for you to manage your diabetes symptoms. The plan is color-coded to help you understand what actions you need to take based on any symptoms you are having. Follow the plan that you develop with your health care provider. Make sure you know your personal target blood sugar level. Review your treatment plan with your health care  provider at each visit. This information is not intended to replace advice given to you by your health care provider. Make sure you discuss any questions you have with your health care provider. Document Revised: 03/12/2020 Document Reviewed: 03/12/2020 Elsevier Patient Education  2022 [employer].   Consent to CCM Services: James [name] was given information about Chronic Care Management services including:  CCM service includes personalized support from designated clinical staff supervised by his physician, including individualized plan of care and coordination with other care providers 24/7 contact phone numbers for assistance for urgent and routine care needs. Service will only be billed when office clinical staff spend 20 minutes or more in a month to coordinate care. Only one practitioner may furnish and bill the service in a calendar month. The patient may stop CCM services at any time (effective at the end of the month) by phone call to the office staff. The patient will be responsible for cost sharing (co-pay) of up to 20% of the service fee (after annual deductible is met).  Patient agreed to services and verbal consent obtained.   Patient verbalizes understanding of instructions provided today and agrees to view in [place].   The patient has been provided with contact information for the care management team and has been advised to call with any health related questions or concerns.  The care management  team will reach out to the patient again over the next 2-3 months days.

## 2021-08-19 NOTE — Chronic Care Management (AMB) (Signed)
Care Management    RN Visit Note  08/19/2021 Name: James [name] MRN: [id number] DOB: 1981-07-26  Subjective: James [name] is a 40 y.o. year old male who is a primary care patient of James Bush, MD. The care management team was consulted for assistance with disease management and care coordination needs.    Engaged with patient by telephone for follow up visit in response to provider referral for case management and/or care coordination services.   Consent to Services:   James [name] was given information about Care Management services today including:  Care Management services includes personalized support from designated clinical staff supervised by his physician, including individualized plan of care and coordination with other care providers 24/7 contact phone numbers for assistance for urgent and routine care needs. The patient may stop case management services at any time by phone call to the office staff.  Patient agreed to services and consent obtained.   Assessment: Review of patient past medical history, allergies, medications, health status, including review of consultants reports, laboratory and other test data, was performed as part of comprehensive evaluation and provision of chronic care management services.   SDOH (Social Determinants of Health) assessments and interventions performed:    Care Plan  No Known Allergies  Outpatient Encounter Medications as of 08/19/2021  Medication Sig Note   aspirin EC 81 MG EC tablet Take 1 tablet (81 mg total) by mouth daily. Swallow whole.    atorvastatin (LIPITOR) 80 MG tablet Take 1 tablet (80 mg total) by mouth once daily at bedtime.    citalopram (CELEXA) 20 MG tablet TAKE ONE TABLET BY MOUTH DAILY    glimepiride (AMARYL) 4 MG tablet Take 1 tablet (4 mg total) by mouth daily with breakfast.    Magnesium 400 MG CAPS Take 1 capsule by mouth daily.    metFORMIN (GLUCOPHAGE) 1000 MG tablet Take 1 tablet (1,000 mg  total) by mouth 2 (two) times daily.    metoprolol succinate (TOPROL-XL) 25 MG 24 hr tablet Take 1.5 tablets (25 mg total) by mouth once daily.    Multiple Vitamin (MULTIVITAMIN ADULT) TABS Take 1 tablet by mouth daily. Nature's nutrition Blood sugar support - with cinnamon, chromium, and ALA    Omega-3 Fatty Acids (FISH OIL) 1200 MG CAPS Take 1 capsule by mouth in the morning and at bedtime. 08/19/2021: Patient states he is taking 2  1200 mg daily   vitamin B-12 (V-R VITAMIN B-12) 500 MCG tablet Take 1 tablet (500 mcg total) by mouth daily.    Insulin Glargine Solostar (LANTUS) 100 UNIT/ML Solostar Pen Inject 22 Units into the skin daily. 08/19/2021: Patient states he was increased to 25 units daily   No facility-administered encounter medications on file as of 08/19/2021.    Patient Active Problem List   Diagnosis Date Noted   CAD (coronary artery disease) 11/18/2020   Tachycardia 06/19/2020   Left leg pain 02/28/2020   OSA (obstructive sleep apnea) 27/25/3664   Folliculitis 40/34/7425   Grade 3 hypertensive retinopathy, left 03/01/2019   Left shoulder pain 05/29/2017   Vitamin B12 deficiency 03/18/2016   Major depressive disorder, recurrent episode, moderate ([place]) 12/26/2015   Foot pain, bilateral 02/03/2015   Health maintenance examination 12/09/2014   HTN (hypertension)    Type 2 diabetes mellitus with diabetic neuropathy, unspecified (HCC)    Obesity, Class I, BMI 30.0-34.9 (see actual BMI)    NAFLD (nonalcoholic fatty liver disease) 04/17/2013   Dyslipidemia associated with type 2 diabetes mellitus ([place]) 11/20/2010  Conditions to be addressed/monitored: CAD, HTN, HLD, and DMII  Care Plan : [facility] plan of care  Updates made by Dannielle Karvonen, RN since 08/19/2021 12:00 AM     Problem: Chronic disease management, education, and / or care coordination needs   Priority: High     Long-Range Goal: Development of plan of care to address chronic disease management and/ or care  coordination needs.   Start Date: 08/19/2021  Expected End Date: 11/16/2021  Priority: High  Note:   Current Barriers:  Knowledge Deficits related to plan of care for management of CAD, HTN, HLD, and DMII  Chronic Disease Management support and education needs related to CAD, HTN, HLD, and DMII Patient reports having follow up  visit with cardiologist on 08/18/2021.  No change in treatment plan per patient. Next follow up in 6 months.  Patient reports having follow up with primary care provider on 08/02/2021.  He states his insulin was increased to 25 units/ day.  He reports most recent Hgb A1c is 8.7.  Patient states he has not been checking his blood sugars regularly.  RNCM discussed importance of daily monitoring and recording of blood sugars.  Patient states he is exercising 5 days a week at the gym doing cardio and weights.  Patient reports minor adjustment with diet such as eliminating diet soda.  Discussed importance of incorporating heart healthy food choices in diet regiment. Patient verbalized understanding.  RNCM Clinical Goal(s):  Patient will verbalize basic understanding of CAD, HTN, HLD, and DMII disease process and self health management plan as evidenced by patient report and / or notation in chart take all medications exactly as prescribed and will call provider for medication related questions as evidenced by patient report and/ or notation in chart    demonstrate understanding of rationale for each prescribed medication as evidenced by patient report and/ or notation in chart    continue to work with Consulting civil engineer and/or Social Worker to address care management and care coordination needs related to CAD, HTN, HLD, and DMII as evidenced by adherence to CM Team Scheduled appointments     through collaboration with Consulting civil engineer, provider, and care team.   Interventions: 1:1 collaboration with primary care provider regarding development and update of comprehensive plan of care as  evidenced by provider attestation and co-signature Inter-disciplinary care team collaboration (see longitudinal plan of care) Evaluation of current treatment plan related to  self management and patient's adherence to plan as established by provider   CAD Interventions: (Status:  Goal on track:  Yes.) Long Term Goal Assessed understanding of CAD diagnosis Medications reviewed including medications utilized in CAD treatment plan Provided education on importance of blood pressure control in management of CAD Provided education on Importance of limiting foods high in cholesterol Reviewed Importance of taking all medications as prescribed Reviewed Importance of attending all scheduled provider appointments   Diabetes Interventions:  (Status:  Goal on track:  Yes.) Long Term Goal Assessed patient's understanding of A1c goal: <7% Reviewed medications with patient and discussed importance of medication adherence Discussed plans with patient for ongoing care management follow up and provided patient with direct contact information for care management team Provided patient with written educational materials related to hypo and hyperglycemia and importance of correct treatment Reviewed scheduled/upcoming provider appointments including:   Discussed importance of monitoring blood sugars daily and recording Lab Results  Component Value Date   HGBA1C 8.7 Repeated and verified X2. (H) 08/02/2021   Hyperlipidemia Interventions:  (  Status:  Goal on track:  Yes.) Long Term Goal Medication review performed; medication list updated in electronic medical record.  Provided HLD educational materials Reviewed importance of limiting foods high in cholesterol  Hypertension Interventions:  (Status:  Goal on track:  Yes.) Long Term Goal Last practice recorded BP readings:  BP Readings from Last 3 Encounters:  08/02/21 128/80  04/14/21 126/70  01/13/21 130/72  Most recent eGFR/CrCl: No results found for: EGFR   No components found for: CRCL  Evaluation of current treatment plan related to hypertension self management and patient's adherence to plan as established by provider Reviewed medications with patient and discussed importance of compliance Discussed plans with patient for ongoing care management follow up and provided patient with direct contact information for care management team Advised patient, providing education and rationale, to monitor blood pressure daily and record, calling PCP for findings outside established parameters Reviewed scheduled/upcoming provider appointments including:   Mailed patient education material on heart healthy diet.   Patient Goals/Self-Care Activities: Take medications as prescribed   Attend all scheduled provider appointments Call pharmacy for medication refills 3-7 days in advance of running out of medications Call provider office for new concerns or questions  Check blood sugars daily and record.  Report consist blood sugars <70 or over 200. Review education articles on heart healthy diet, Hyper/ hypoglycemia, and Hyperlipidemia ( high blood cholesterol) Monitor blood pressure at least 1 time per week and record       Plan: The patient has been provided with contact information for the care management team and has been advised to call with any health related questions or concerns.  The care management team will reach out to the patient again over the next 2-3 months .  Quinn Plowman RN,BSN,CCM RN Case Manager [place]  [phone]

## 2021-08-23 ENCOUNTER — Encounter: Payer: Self-pay | Admitting: Family Medicine

## 2021-09-03 ENCOUNTER — Telehealth: Payer: PRIVATE HEALTH INSURANCE

## 2021-09-24 ENCOUNTER — Encounter: Payer: Self-pay | Admitting: Family Medicine

## 2021-09-27 MED ORDER — TRULICITY 0.75 MG/0.5ML ~~LOC~~ SOAJ: 0.7500 mg | SUBCUTANEOUS | 3 refills | Status: DC

## 2021-09-28 NOTE — Telephone Encounter (Signed)
Submitted PA for Trulicity; key:  [id number].  Decision pending.

## 2021-09-30 ENCOUNTER — Encounter: Payer: Self-pay | Admitting: Family Medicine

## 2021-10-06 NOTE — Telephone Encounter (Signed)
Received faxed PA approval, valid 09/29/2021- 09/29/2022.

## 2021-10-14 ENCOUNTER — Other Ambulatory Visit: Payer: Self-pay | Admitting: Family Medicine

## 2021-10-14 NOTE — Telephone Encounter (Signed)
Last office visit 08/02/2021 for CPE.  Last refilled 12/03/2020 for #45 with 2 refills with instructions to take 1.5 tablets daily.   Per Cardiology note from 11/30/202:  metoprolol succinate (TOPROL-XL) 25 MG XL tablet Take 1 tablet by mouth once daily.  Please advise how to refill?

## 2021-10-19 ENCOUNTER — Encounter: Payer: Self-pay | Admitting: Family Medicine

## 2021-10-19 NOTE — Telephone Encounter (Signed)
I've sent in 1.5 tablet dose. See mychart message

## 2021-11-04 ENCOUNTER — Telehealth: Payer: 59

## 2021-12-03 ENCOUNTER — Ambulatory Visit: Payer: PRIVATE HEALTH INSURANCE | Admitting: Family Medicine

## 2021-12-10 ENCOUNTER — Other Ambulatory Visit (HOSPITAL_COMMUNITY): Payer: Self-pay

## 2021-12-10 ENCOUNTER — Encounter: Payer: Self-pay | Admitting: Family Medicine

## 2021-12-17 MED ORDER — CITALOPRAM HYDROBROMIDE 20 MG PO TABS: 20.0000 mg | ORAL_TABLET | Freq: Every day | ORAL | 1 refills | Status: AC

## 2021-12-17 MED ORDER — ATORVASTATIN CALCIUM 80 MG PO TABS: 80.0000 mg | ORAL_TABLET | Freq: Every day | ORAL | 1 refills | Status: AC

## 2021-12-17 MED ORDER — METFORMIN HCL 1000 MG PO TABS: 1000.0000 mg | ORAL_TABLET | Freq: Two times a day (BID) | ORAL | 1 refills | Status: AC

## 2021-12-17 MED ORDER — TRULICITY 0.75 MG/0.5ML ~~LOC~~ SOAJ: 0.7500 mg | SUBCUTANEOUS | 5 refills | Status: AC

## 2021-12-17 MED ORDER — METOPROLOL SUCCINATE ER 25 MG PO TB24: 37.5000 mg | ORAL_TABLET | Freq: Every day | ORAL | 1 refills | Status: AC

## 2021-12-17 MED ORDER — GLIMEPIRIDE 4 MG PO TABS: 4.0000 mg | ORAL_TABLET | Freq: Every day | ORAL | 1 refills | Status: AC

## 2021-12-21 ENCOUNTER — Telehealth: Payer: Self-pay

## 2021-12-21 NOTE — Telephone Encounter (Signed)
James [name] has been started and phone note entered with information in patient record.  ?

## 2021-12-21 NOTE — Telephone Encounter (Signed)
Please update patient information with new card  ?

## 2021-12-21 NOTE — Telephone Encounter (Signed)
No, I haven't.  If you would start it plz, that would be great. ?

## 2021-12-23 NOTE — Telephone Encounter (Signed)
Per [employer], PA approved; valid 12/21/2021- 12/21/2022.  Notified pharmacy. ?

## 2021-12-23 NOTE — Telephone Encounter (Signed)
PA approved (see 12/21/21 phn note).  Notified pt via MyChart.  ?

## 2022-05-06 ENCOUNTER — Telehealth: Payer: Self-pay

## 2022-05-06 NOTE — Telephone Encounter (Signed)
  Care Management   Follow Up Note   05/06/2022 Name: Cassady Turano MRN: [id number] DOB: 06/25/1981   Referred by: [name], MD Reason for referral : Care Coordination   An unsuccessful telephone outreach was attempted today. The patient was referred to the case management team for assistance with care management and care coordination.   Follow Up Plan: The care management team will reach out to the patient again over the next 3 weeks.  George Ina RN,BSN,CCM RN Care Manager Coordinator [phone]

## 2022-05-11 ENCOUNTER — Ambulatory Visit: Payer: Self-pay

## 2022-05-11 NOTE — Chronic Care Management (AMB) (Signed)
Care Management    RN Visit Note  05/11/2022 Name: James [name] MRN: [id number] DOB: 04/05/1981  Subjective: James [name] is a 41 y.o. year old male who is a primary care patient of James Bush, MD. The care management team was consulted for assistance with disease management and care coordination needs.    Engaged with patient by telephone for follow up visit in response to provider referral for case management and/or care coordination services.   Consent to Services:   James [name] was given information about Care Management services today including:  Care Management services includes personalized support from designated clinical staff supervised by his physician, including individualized plan of care and coordination with other care providers 24/7 contact phone numbers for assistance for urgent and routine care needs. The patient may stop case management services at any time by phone call to the office staff.  Patient agreed to services and consent obtained.   Assessment: Review of patient past medical history, allergies, medications, health status, including review of consultants reports, laboratory and other test data, was performed as part of comprehensive evaluation and provision of chronic care management services.   SDOH (Social Determinants of Health) assessments and interventions performed:    Care Plan  No Known Allergies  Outpatient Encounter Medications as of 05/11/2022  Medication Sig Note   aspirin EC 81 MG EC tablet Take 1 tablet (81 mg total) by mouth daily. Swallow whole.    atorvastatin (LIPITOR) 80 MG tablet Take 1 tablet (80 mg total) by mouth once daily at bedtime.    citalopram (CELEXA) 20 MG tablet Take 1 tablet (20 mg total) by mouth daily.    Dulaglutide (TRULICITY) 8.14 GY/1.8HU SOPN Inject 0.75 mg into the skin once a week.    glimepiride (AMARYL) 4 MG tablet Take 1 tablet (4 mg total) by mouth daily with breakfast.    Insulin Glargine  Solostar (LANTUS) 100 UNIT/ML Solostar Pen Inject 22 Units into the skin daily. 08/19/2021: Patient states he was increased to 25 units daily   Magnesium 400 MG CAPS Take 1 capsule by mouth daily.    metFORMIN (GLUCOPHAGE) 1000 MG tablet Take 1 tablet (1,000 mg total) by mouth 2 (two) times daily.    metoprolol succinate (TOPROL-XL) 25 MG 24 hr tablet Take 1.5 tablets (37.5 mg total) by mouth daily.    Multiple Vitamin (MULTIVITAMIN ADULT) TABS Take 1 tablet by mouth daily. Nature's nutrition Blood sugar support - with cinnamon, chromium, and ALA    Omega-3 Fatty Acids (FISH OIL) 1200 MG CAPS Take 1 capsule by mouth in the morning and at bedtime. 08/19/2021: Patient states he is taking 2  1200 mg daily   vitamin B-12 (V-R VITAMIN B-12) 500 MCG tablet Take 1 tablet (500 mcg total) by mouth daily.    No facility-administered encounter medications on file as of 05/11/2022.    Patient Active Problem List   Diagnosis Date Noted   CAD (coronary artery disease) 11/18/2020   Tachycardia 06/19/2020   Left leg pain 02/28/2020   OSA (obstructive sleep apnea) 31/49/7026   Folliculitis 37/85/8850   Grade 3 hypertensive retinopathy, left 03/01/2019   Left shoulder pain 05/29/2017   Vitamin B12 deficiency 03/18/2016   Major depressive disorder, recurrent episode, moderate ([place]) 12/26/2015   Foot pain, bilateral 02/03/2015   Health maintenance examination 12/09/2014   HTN (hypertension)    Type 2 diabetes mellitus with diabetic neuropathy, unspecified (HCC)    Obesity, Class I, BMI 30.0-34.9 (see actual BMI)  NAFLD (nonalcoholic fatty liver disease) 04/17/2013   Dyslipidemia associated with type 2 diabetes mellitus ([place]) 11/20/2010    Conditions to be addressed/monitored: Goal closed. No further follow up wanted from patient.   Care Plan : [facility] plan of care  Updates made by Dannielle Karvonen, RN since 05/11/2022 12:00 AM     Problem: Chronic disease management, education, and / or care coordination  needs   Priority: High     Long-Range Goal: Development of plan of care to address chronic disease management and/ or care coordination needs.   Start Date: 08/19/2021  Expected End Date: 11/16/2021  Priority: High  Note:   Goal closed.  No further follow up wanted from patient.   Current Barriers:  Knowledge Deficits related to plan of care for management of CAD, HTN, HLD, and DMII  Chronic Disease Management support and education needs related to CAD, HTN, HLD, and DMII Patient states he is currently working at [employer] and changed primary care providers.  Patient states he does not want or feels as though he needs additional follow up with RNCM.   Patient aware to follow up with provided for any health changes or concerns.   RNCM Clinical Goal(s):  Patient will verbalize basic understanding of CAD, HTN, HLD, and DMII disease process and self health management plan as evidenced by patient report and / or notation in chart take all medications exactly as prescribed and will call provider for medication related questions as evidenced by patient report and/ or notation in chart    demonstrate understanding of rationale for each prescribed medication as evidenced by patient report and/ or notation in chart    continue to work with Consulting civil engineer and/or Social Worker to address care management and care coordination needs related to CAD, HTN, HLD, and DMII as evidenced by adherence to CM Team Scheduled appointments     through collaboration with Consulting civil engineer, provider, and care team.   Interventions: 1:1 collaboration with primary care provider regarding development and update of comprehensive plan of care as evidenced by provider attestation and co-signature Inter-disciplinary care team collaboration (see longitudinal plan of care) Evaluation of current treatment plan related to  self management and patient's adherence to plan as established by provider   CAD Interventions: (Status:   Goal closed. No further follow up wanted from patient.  Assessed understanding of CAD diagnosis Medications reviewed including medications utilized in CAD treatment plan Provided education on importance of blood pressure control in management of CAD Provided education on Importance of limiting foods high in cholesterol Reviewed Importance of taking all medications as prescribed Reviewed Importance of attending all scheduled provider appointments   Diabetes Interventions:  (Status:  goal closed. No further follow up wanted from patient.  Assessed patient's understanding of A1c goal: <7% Reviewed medications with patient and discussed importance of medication adherence Discussed plans with patient for ongoing care management follow up and provided patient with direct contact information for care management team Provided patient with written educational materials related to hypo and hyperglycemia and importance of correct treatment Reviewed scheduled/upcoming provider appointments including:   Discussed importance of monitoring blood sugars daily and recording Lab Results  Component Value Date   HGBA1C 8.7 Repeated and verified X2. (H) 08/02/2021   Hyperlipidemia Interventions:  (Status:  Goal on track:  Yes.) Long Term Goal Medication review performed; medication list updated in electronic medical record.  Provided HLD educational materials Reviewed importance of limiting foods high in cholesterol  Hypertension  Interventions:  (Status: Goal closed. No further follow up wanted from patient.  Last practice recorded BP readings:  BP Readings from Last 3 Encounters:  08/02/21 128/80  04/14/21 126/70  01/13/21 130/72  Most recent eGFR/CrCl: No results found for: EGFR  No components found for: CRCL  Evaluation of current treatment plan related to hypertension self management and patient's adherence to plan as established by provider Reviewed medications with patient and discussed importance  of compliance Discussed plans with patient for ongoing care management follow up and provided patient with direct contact information for care management team Advised patient, providing education and rationale, to monitor blood pressure daily and record, calling PCP for findings outside established parameters Reviewed scheduled/upcoming provider appointments including:   Mailed patient education material on heart healthy diet.   Patient Goals/Self-Care Activities: Take medications as prescribed   Attend all scheduled provider appointments Call pharmacy for medication refills 3-7 days in advance of running out of medications Call provider office for new concerns or questions  Check blood sugars daily and record.  Report consist blood sugars <70 or over 200. Review education articles on heart healthy diet, Hyper/ hypoglycemia, and Hyperlipidemia ( high blood cholesterol) Monitor blood pressure at least 1 time per week and record       Plan: No further follow up required:    Quinn Plowman RN,BSN,CCM RN Care Manager Coordinator [phone]

## 2022-05-11 NOTE — Patient Instructions (Signed)
Visit Information  Thank you for allowing me to share the care management and care coordination services that are available to you as part of your health plan and services through your primary care provider and medical home. Please reach out to your primary care provider for any further needs or concerns.  George Ina RN,BSN,CCM RN Care Manager Coordinator [phone]

## 2022-05-16 IMAGING — CR DG CHEST 2V
2 series · 2 of 2 positions shown · non-contrast
Comparison: 10/08/2014

CLINICAL DATA: Chest pain

EXAM:
CHEST - 2 VIEW

[chest pa]
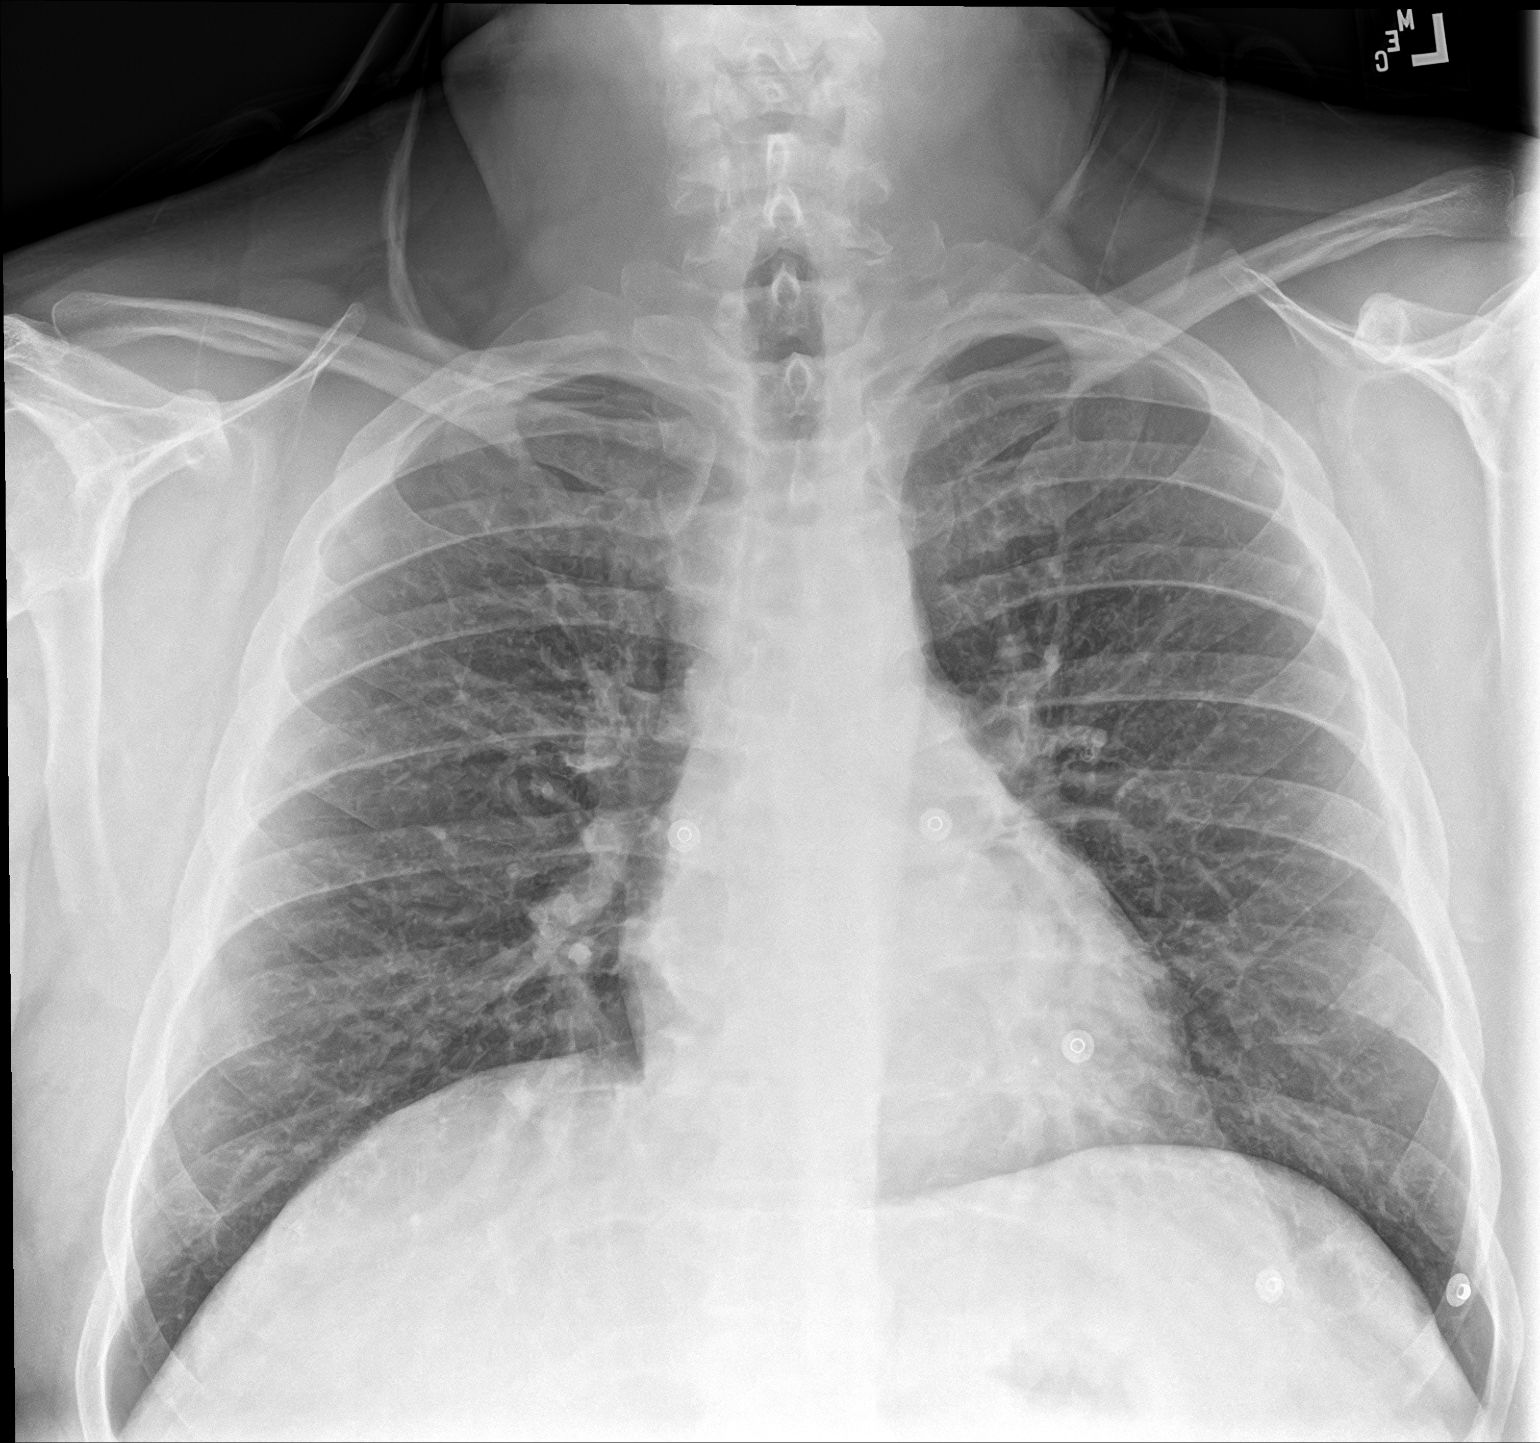

[chest lat]
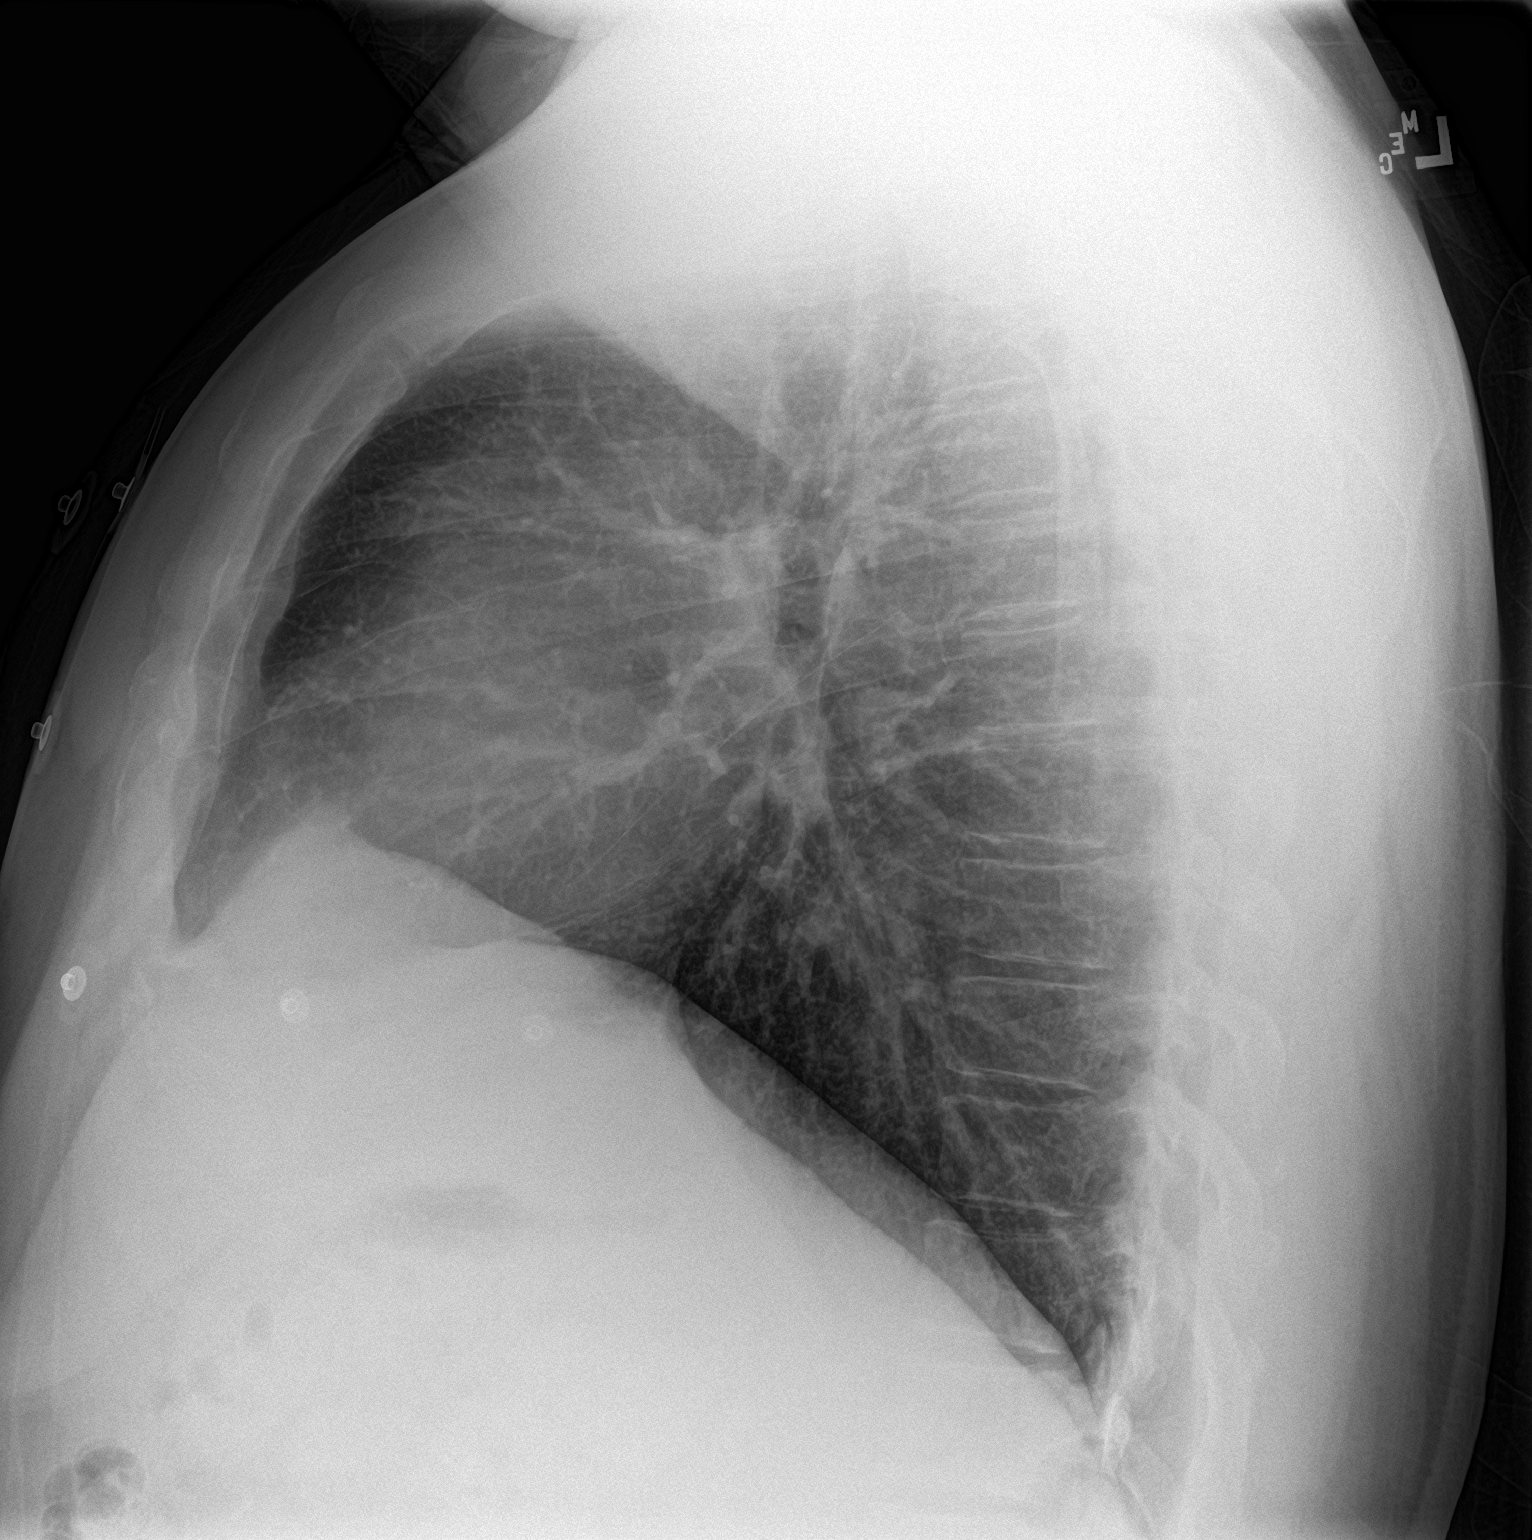

[2 of 2 positions shown; findings below may reference images not displayed]

FINDINGS: Heart and mediastinal contours are within normal limits. No focal
opacities or effusions. No acute bony abnormality.
IMPRESSION: No active cardiopulmonary disease.

## 2022-07-23 ENCOUNTER — Other Ambulatory Visit: Payer: Self-pay | Admitting: Family Medicine

## 2022-07-25 NOTE — Telephone Encounter (Signed)
Pt c/x 12/03/21 OV- reason "Unhappy/Changed Provider".  Lvm asking pt to call back to confirm he no longer sees Dr. Darnell Level.

## 2022-07-26 NOTE — Telephone Encounter (Signed)
Pt c/x 12/03/21 OV- reason "Unhappy/Changed Provider".  Lvm asking pt to call back to confirm he no longer sees Dr. G.  

## 2022-07-27 NOTE — Telephone Encounter (Signed)
FYI Dr.Gutierrez

## 2022-07-27 NOTE — Telephone Encounter (Signed)
Pt c/x 12/03/21 OV- reason "Unhappy/Changed Provider".  Lvm asking pt to call back to confirm he no longer sees Dr. Reece Agar.

## 2022-07-27 NOTE — Telephone Encounter (Signed)
Patient called and said he changed doctors and to ignore prescription request.

## 2022-08-25 ENCOUNTER — Other Ambulatory Visit: Payer: Self-pay

## 2022-08-25 ENCOUNTER — Emergency Department: Payer: Managed Care, Other (non HMO)

## 2022-08-25 ENCOUNTER — Emergency Department
Admission: EM | Admit: 2022-08-25 | Discharge: 2022-08-25 | Disposition: A | Discharge status: Home / Self Care | Payer: Managed Care, Other (non HMO) | Attending: Emergency Medicine | Admitting: Emergency Medicine

## 2022-08-25 DIAGNOSIS — I10 Essential (primary) hypertension: Secondary | ICD-10-CM | POA: Insufficient documentation

## 2022-08-25 DIAGNOSIS — E119 Type 2 diabetes mellitus without complications: Secondary | ICD-10-CM | POA: Diagnosis not present

## 2022-08-25 DIAGNOSIS — R0789 Other chest pain: Secondary | ICD-10-CM | POA: Diagnosis present

## 2022-08-25 DIAGNOSIS — I2510 Atherosclerotic heart disease of native coronary artery without angina pectoris: Secondary | ICD-10-CM | POA: Diagnosis not present

## 2022-08-25 LAB — CBC WITH DIFFERENTIAL/PLATELET
Abs Immature Granulocytes: 0.02 10*3/uL (ref 0.00–0.07)
Basophils Absolute: 0.1 10*3/uL (ref 0.0–0.1)
Basophils Relative: 1 %
Eosinophils Absolute: 0.2 10*3/uL (ref 0.0–0.5)
Eosinophils Relative: 2 %
HCT: 46.1 % (ref 39.0–52.0)
Hemoglobin: 15.5 g/dL (ref 13.0–17.0)
Immature Granulocytes: 0 %
Lymphocytes Relative: 30 %
Lymphs Abs: 2.4 10*3/uL (ref 0.7–4.0)
MCH: 28.3 pg (ref 26.0–34.0)
MCHC: 33.6 g/dL (ref 30.0–36.0)
MCV: 84.3 fL (ref 80.0–100.0)
Monocytes Absolute: 0.6 10*3/uL (ref 0.1–1.0)
Monocytes Relative: 8 %
Neutro Abs: 4.8 10*3/uL (ref 1.7–7.7)
Neutrophils Relative %: 59 %
Platelets: 194 10*3/uL (ref 150–400)
RBC: 5.47 MIL/uL (ref 4.22–5.81)
RDW: 12.6 % (ref 11.5–15.5)
WBC: 8 10*3/uL (ref 4.0–10.5)
nRBC: 0 % (ref 0.0–0.2)

## 2022-08-25 LAB — COMPREHENSIVE METABOLIC PANEL
ALT: 41 U/L (ref 0–44)
AST: 36 U/L (ref 15–41)
Albumin: 4.8 g/dL (ref 3.5–5.0)
Alkaline Phosphatase: 82 U/L (ref 38–126)
Anion gap: 11 (ref 5–15)
BUN: 13 mg/dL (ref 6–20)
CO2: 21 mmol/L — ABNORMAL LOW (ref 22–32)
Calcium: 9.3 mg/dL (ref 8.9–10.3)
Chloride: 103 mmol/L (ref 98–111)
Creatinine, Ser: 0.87 mg/dL (ref 0.61–1.24)
GFR, Estimated: >60 mL/min (ref >60)
Glucose, Bld: 143 mg/dL — ABNORMAL HIGH (ref 70–99)
Potassium: 4 mmol/L (ref 3.5–5.1)
Sodium: 135 mmol/L (ref 135–145)
Total Bilirubin: 1.1 mg/dL (ref 0.3–1.2)
Total Protein: 8.2 g/dL — ABNORMAL HIGH (ref 6.5–8.1)

## 2022-08-25 LAB — TROPONIN I (HIGH SENSITIVITY)
Troponin I (High Sensitivity): <2 ng/L (ref <18)
Troponin I (High Sensitivity): <2 ng/L (ref <18)

## 2022-08-25 NOTE — ED Provider Triage Note (Signed)
  Emergency Medicine Provider Triage Evaluation Note  James [name] , a 40 y.o.male,  was evaluated in triage.  Pt complains of chest pain that started approximately 15 minutes ago.  He works at [employer].  He states that he had a stent placed years back.  He states that the pain is 5/10, located on the left side of his chest.  No radiation.  Denies any shortness of breath.   Review of Systems  Positive: Chest pain Negative: Denies fever, abdominal pain, vomiting  Physical Exam  There were no vitals filed for this visit. Gen:   Awake, no distress   Resp:  Normal effort  MSK:   Moves extremities without difficulty  Other:    Medical Decision Making  Given the patient's initial medical screening exam, the following diagnostic evaluation has been ordered. The patient will be placed in the appropriate treatment space, once one is available, to complete the evaluation and treatment. I have discussed the plan of care with the patient and I have advised the patient that an ED physician or mid-level practitioner will reevaluate their condition after the test results have been received, as the results may give them additional insight into the type of treatment they may need.    Diagnostics: Labs, EKG, CXR  Treatments: none immediately   Varney Daily, [place] 08/25/22 1450

## 2022-08-25 NOTE — ED Triage Notes (Addendum)
Intermittent LEFT sided chest pain that began this AM; Was moving computers at work when the pain came back and his coworkers reported that he became flushed then pale; Cardiac stent placed last year

## 2022-08-25 NOTE — ED Notes (Signed)
E-signature pad unavailable - Pt verbalized understanding of D/C information - no additional concerns at this time.  

## 2022-08-25 NOTE — ED Provider Notes (Signed)
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 male with past medical history of hypertension, hyperlipidemia, diabetes, and CAD who presents to the ED complaining of chest pain.  Patient reports earlier today at work he developed sharp pain in the left side of his chest while sitting and working at his computer.  Pain lasted for few minutes before resolving on its own, but he had another flareup of pain later while moving a computer that lasted slightly longer.  He denies any associated shortness of breath and he has not had any recent fevers, cough, pain or swelling in his legs.  He has continued to have intermittent chest pain while in the waiting room similar to the previous episodes, but denies any chest pain currently.  He describes pain as different to when he required coronary stent in the past.     Physical Exam   Triage Vital Signs: ED Triage Vitals  Enc Vitals Group     BP 08/25/22 1452 (!) 118/97     Pulse Rate 08/25/22 1452 95     Resp 08/25/22 1452 19     Temp 08/25/22 1452 98.8 F (37.1 C)     Temp Source 08/25/22 1452 Oral     SpO2 08/25/22 1452 100 %     Weight 08/25/22 1449 270 lb (122.5 kg)     Height 08/25/22 1449 6\' 4"  (1.93 m)     Head Circumference --      Peak Flow --      Pain Score 08/25/22 1450 5     Pain Loc --      Pain Edu? --      Excl. in GC? --     Most recent vital signs: Vitals:   08/25/22 1452 08/25/22 1713  BP: (!) 118/97 126/86  Pulse: 95 96  Resp: 19 16  Temp: 98.8 F (37.1 C) 98.4 F (36.9 C)  SpO2: 100% 95%    Constitutional: Alert and oriented. Eyes:  Conjunctivae are normal. Head: Atraumatic. Nose: No congestion/rhinnorhea. Mouth/Throat: Mucous membranes are moist.  Cardiovascular: Normal rate, regular rhythm. Grossly normal heart sounds.  2+ radial pulses bilaterally. Respiratory: Normal respiratory effort.  No retractions. Lungs CTAB.  No chest wall tenderness to palpation. Gastrointestinal: Soft and nontender. No distention. Musculoskeletal: No lower extremity tenderness nor edema.  Neurologic:  Normal speech and language. No gross focal neurologic deficits are appreciated.    ED Results / Procedures / Treatments   Labs (all labs ordered are listed, but only abnormal results are displayed) Labs Reviewed  COMPREHENSIVE METABOLIC PANEL - Abnormal; Notable for the following components:      Result Value   CO2 21 (*)    Glucose, Bld 143 (*)    Total Protein 8.2 (*)    All other components within normal limits  CBC WITH DIFFERENTIAL/PLATELET  TROPONIN I (HIGH SENSITIVITY)  TROPONIN I (HIGH SENSITIVITY)     EKG  ED ECG REPORT I, 14/07/23, the attending physician, personally viewed and interpreted this ECG.   Date: 08/25/2022  EKG Time: 14:44  Rate: 108  Rhythm:  sinus tachycardia  Axis: LAD  Intervals:none  ST&T Change: None  RADIOLOGY Chest x-ray reviewed and interpreted by me with no infiltrate, edema, or effusion.  PROCEDURES:  Critical Care performed: No  Procedures   MEDICATIONS ORDERED IN ED: Medications - No data to display   IMPRESSION / MDM / ASSESSMENT AND PLAN / ED COURSE  I reviewed the triage vital signs and the nursing notes.                              41 y.o. male with past medical history of hypertension, hyperlipidemia, diabetes, and CAD who presents to the ED complaining of sharp left-sided chest pain intermittently over the course of the day today.  Patient's presentation is most consistent with acute presentation with potential threat to life or bodily  function.  Differential diagnosis includes, but is not limited to, ACS, PE, dissection, pneumonia, pneumothorax, musculoskeletal pain, GERD, and anxiety.  Patient well-appearing and in no acute distress, vital signs are unremarkable.  EKG shows no evidence of arrhythmia or ischemia and 2 sets of troponin are negative.  With atypical symptoms I have low suspicion for ACS, doubt PE or dissection as he is currently pain-free with reassuring vital signs.  Remainder of workup is reassuring with no significant anemia, leukocytosis, electrolyte abnormality, or AKI.  Chest x-ray is unremarkable.  Patient is appropriate for discharge home with cardiology follow-up, was counseled to return to the ED for new or worsening symptoms, patient agrees with plan.      FINAL CLINICAL IMPRESSION(S) / ED DIAGNOSES   Final diagnoses:  Atypical chest pain     Rx / DC Orders   ED Discharge Orders     None        Note:  This document was prepared using Dragon voice recognition software and may include unintentional dictation errors.   Chesley Noon, MD 08/25/22 2348
